# Patient Record
Sex: Male | Born: 1945 | Race: White | Hispanic: No | Marital: Married | Smoking: Former smoker
Health system: Southern US, Community
[De-identification: ages and names within clinical notes are randomized; demographics above are authoritative.]

## PROBLEM LIST (undated history)

## (undated) DIAGNOSIS — F32A Depression, unspecified: Secondary | ICD-10-CM

## (undated) DIAGNOSIS — Z808 Family history of malignant neoplasm of other organs or systems: Secondary | ICD-10-CM

## (undated) DIAGNOSIS — E119 Type 2 diabetes mellitus without complications: Secondary | ICD-10-CM

## (undated) DIAGNOSIS — M179 Osteoarthritis of knee, unspecified: Secondary | ICD-10-CM

## (undated) DIAGNOSIS — Z800 Family history of malignant neoplasm of digestive organs: Secondary | ICD-10-CM

## (undated) DIAGNOSIS — C61 Malignant neoplasm of prostate: Secondary | ICD-10-CM

## (undated) DIAGNOSIS — I10 Essential (primary) hypertension: Secondary | ICD-10-CM

## (undated) DIAGNOSIS — M1710 Unilateral primary osteoarthritis, unspecified knee: Secondary | ICD-10-CM

## (undated) DIAGNOSIS — E785 Hyperlipidemia, unspecified: Secondary | ICD-10-CM

## (undated) HISTORY — DX: Hyperlipidemia, unspecified: E78.5

## (undated) HISTORY — DX: Type 2 diabetes mellitus without complications: E11.9

## (undated) HISTORY — PX: PROSTATE BIOPSY: SHX241

## (undated) HISTORY — DX: Unilateral primary osteoarthritis, unspecified knee: M17.10

## (undated) HISTORY — DX: Family history of malignant neoplasm of other organs or systems: Z80.8

## (undated) HISTORY — DX: Family history of malignant neoplasm of digestive organs: Z80.0

## (undated) HISTORY — DX: Osteoarthritis of knee, unspecified: M17.9

## (undated) HISTORY — PX: INGUINAL HERNIA REPAIR: SUR1180

## (undated) HISTORY — DX: Essential (primary) hypertension: I10

---

## 2013-08-30 ENCOUNTER — Ambulatory Visit (HOSPITAL_COMMUNITY)
Admission: RE | Admit: 2013-08-30 | Discharge: 2013-08-30 | Disposition: A | Discharge status: Home / Self Care | Payer: Medicare Other | Source: Ambulatory Visit | Attending: Sports Medicine | Admitting: Sports Medicine

## 2013-08-30 ENCOUNTER — Other Ambulatory Visit (HOSPITAL_COMMUNITY): Payer: Self-pay | Admitting: Sports Medicine

## 2013-08-30 DIAGNOSIS — Z1389 Encounter for screening for other disorder: Secondary | ICD-10-CM

## 2013-08-30 IMAGING — CR DG ORBITS FOR FOREIGN BODY
2 series · 2 of 2 positions shown · non-contrast
Comparison: None.

CLINICAL DATA: History metal work.  Pre MRI screening.

EXAM:
ORBITS FOR FOREIGN BODY - 2 VIEW

[w waters * (1 of 2)]
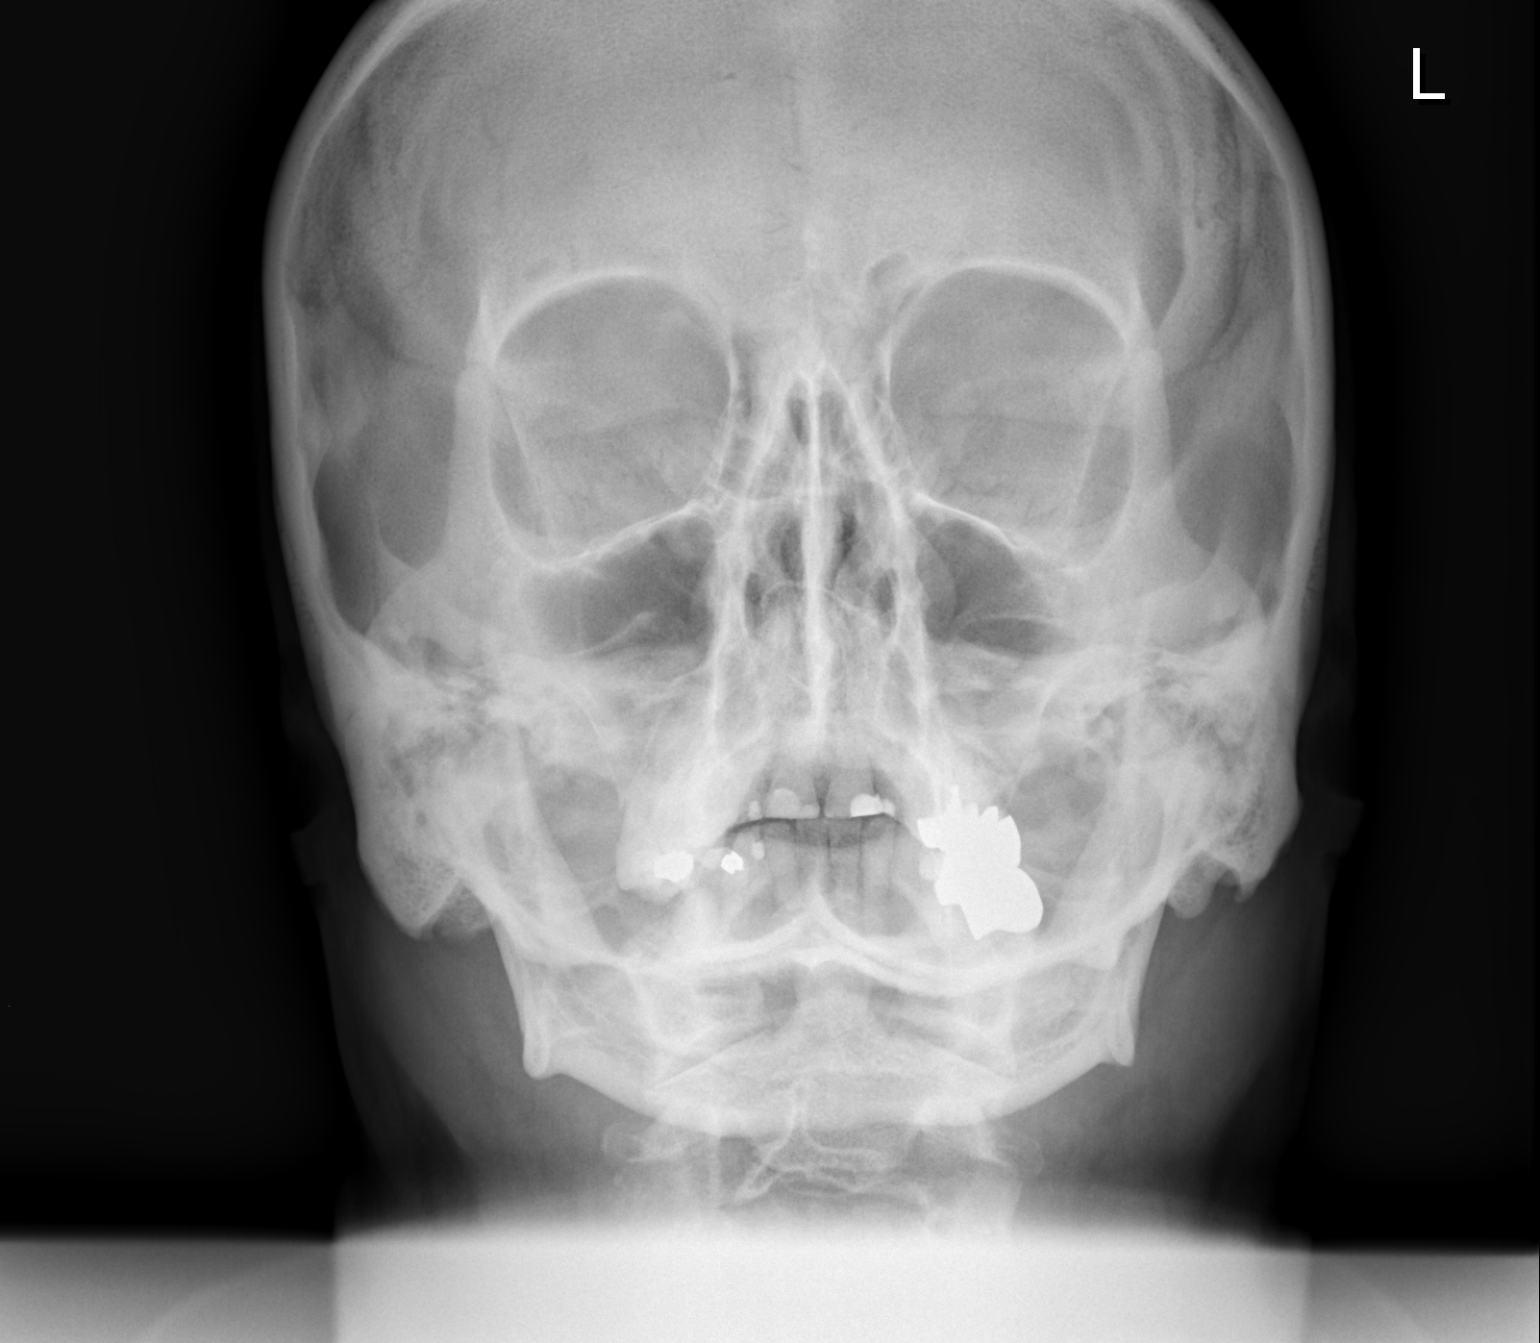

[w waters * (2 of 2)]
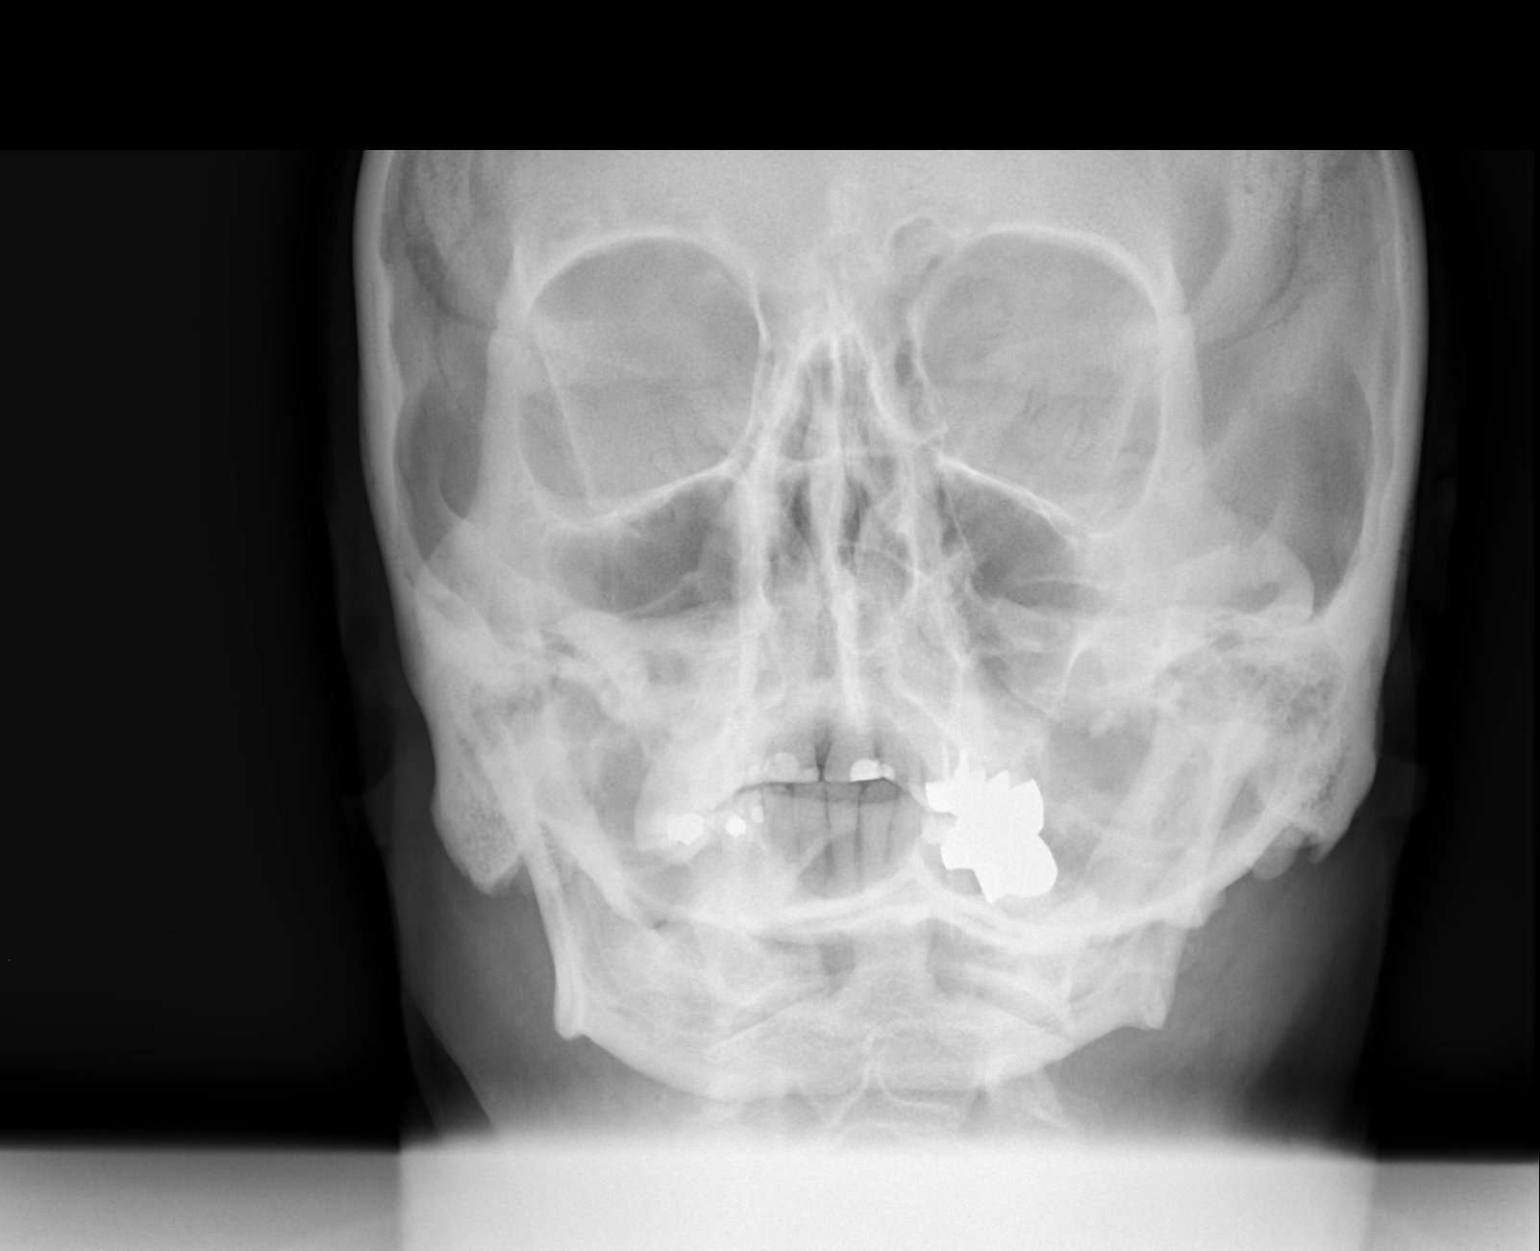

[2 of 2 positions shown; findings below may reference images not displayed]

FINDINGS: There is no evidence of metallic foreign body within the orbits. No
significant bone abnormality identified.
IMPRESSION: No evidence of metallic foreign body within the orbits.

## 2016-11-21 DIAGNOSIS — F419 Anxiety disorder, unspecified: Secondary | ICD-10-CM | POA: Diagnosis not present

## 2016-11-21 DIAGNOSIS — Z0000 Encounter for general adult medical examination without abnormal findings: Secondary | ICD-10-CM | POA: Diagnosis not present

## 2016-11-21 DIAGNOSIS — N529 Male erectile dysfunction, unspecified: Secondary | ICD-10-CM | POA: Diagnosis not present

## 2016-11-21 DIAGNOSIS — M1990 Unspecified osteoarthritis, unspecified site: Secondary | ICD-10-CM | POA: Diagnosis not present

## 2016-11-21 DIAGNOSIS — E785 Hyperlipidemia, unspecified: Secondary | ICD-10-CM | POA: Diagnosis not present

## 2016-11-21 DIAGNOSIS — Z23 Encounter for immunization: Secondary | ICD-10-CM | POA: Diagnosis not present

## 2016-11-21 DIAGNOSIS — E1140 Type 2 diabetes mellitus with diabetic neuropathy, unspecified: Secondary | ICD-10-CM | POA: Diagnosis not present

## 2016-11-21 DIAGNOSIS — Z1159 Encounter for screening for other viral diseases: Secondary | ICD-10-CM | POA: Diagnosis not present

## 2017-02-19 DIAGNOSIS — E785 Hyperlipidemia, unspecified: Secondary | ICD-10-CM | POA: Diagnosis not present

## 2017-02-19 DIAGNOSIS — I10 Essential (primary) hypertension: Secondary | ICD-10-CM | POA: Diagnosis not present

## 2017-02-19 DIAGNOSIS — Z7984 Long term (current) use of oral hypoglycemic drugs: Secondary | ICD-10-CM | POA: Diagnosis not present

## 2017-02-19 DIAGNOSIS — E1140 Type 2 diabetes mellitus with diabetic neuropathy, unspecified: Secondary | ICD-10-CM | POA: Diagnosis not present

## 2017-08-21 DIAGNOSIS — E1140 Type 2 diabetes mellitus with diabetic neuropathy, unspecified: Secondary | ICD-10-CM | POA: Diagnosis not present

## 2017-08-21 DIAGNOSIS — E1142 Type 2 diabetes mellitus with diabetic polyneuropathy: Secondary | ICD-10-CM | POA: Diagnosis not present

## 2017-08-21 DIAGNOSIS — F419 Anxiety disorder, unspecified: Secondary | ICD-10-CM | POA: Diagnosis not present

## 2017-08-21 DIAGNOSIS — E785 Hyperlipidemia, unspecified: Secondary | ICD-10-CM | POA: Diagnosis not present

## 2017-08-21 DIAGNOSIS — E782 Mixed hyperlipidemia: Secondary | ICD-10-CM | POA: Diagnosis not present

## 2017-08-21 DIAGNOSIS — I10 Essential (primary) hypertension: Secondary | ICD-10-CM | POA: Diagnosis not present

## 2017-08-21 DIAGNOSIS — Z23 Encounter for immunization: Secondary | ICD-10-CM | POA: Diagnosis not present

## 2017-08-21 DIAGNOSIS — M545 Low back pain: Secondary | ICD-10-CM | POA: Diagnosis not present

## 2017-08-21 DIAGNOSIS — M1990 Unspecified osteoarthritis, unspecified site: Secondary | ICD-10-CM | POA: Diagnosis not present

## 2017-09-07 DIAGNOSIS — E119 Type 2 diabetes mellitus without complications: Secondary | ICD-10-CM | POA: Diagnosis not present

## 2017-09-07 DIAGNOSIS — Z7984 Long term (current) use of oral hypoglycemic drugs: Secondary | ICD-10-CM | POA: Diagnosis not present

## 2017-09-07 DIAGNOSIS — H2513 Age-related nuclear cataract, bilateral: Secondary | ICD-10-CM | POA: Diagnosis not present

## 2017-09-07 DIAGNOSIS — H524 Presbyopia: Secondary | ICD-10-CM | POA: Diagnosis not present

## 2017-12-17 DIAGNOSIS — M545 Low back pain, unspecified: Secondary | ICD-10-CM | POA: Insufficient documentation

## 2017-12-17 DIAGNOSIS — M48061 Spinal stenosis, lumbar region without neurogenic claudication: Secondary | ICD-10-CM | POA: Insufficient documentation

## 2017-12-22 DIAGNOSIS — M48061 Spinal stenosis, lumbar region without neurogenic claudication: Secondary | ICD-10-CM | POA: Diagnosis not present

## 2018-01-17 DIAGNOSIS — M48061 Spinal stenosis, lumbar region without neurogenic claudication: Secondary | ICD-10-CM | POA: Diagnosis not present

## 2018-02-01 DIAGNOSIS — K635 Polyp of colon: Secondary | ICD-10-CM | POA: Diagnosis not present

## 2018-02-01 DIAGNOSIS — Z1211 Encounter for screening for malignant neoplasm of colon: Secondary | ICD-10-CM | POA: Diagnosis not present

## 2018-02-01 DIAGNOSIS — K5730 Diverticulosis of large intestine without perforation or abscess without bleeding: Secondary | ICD-10-CM | POA: Diagnosis not present

## 2018-02-01 DIAGNOSIS — K640 First degree hemorrhoids: Secondary | ICD-10-CM | POA: Diagnosis not present

## 2018-02-01 DIAGNOSIS — D126 Benign neoplasm of colon, unspecified: Secondary | ICD-10-CM | POA: Diagnosis not present

## 2018-02-05 DIAGNOSIS — Z1211 Encounter for screening for malignant neoplasm of colon: Secondary | ICD-10-CM | POA: Diagnosis not present

## 2018-02-05 DIAGNOSIS — D126 Benign neoplasm of colon, unspecified: Secondary | ICD-10-CM | POA: Diagnosis not present

## 2018-02-05 DIAGNOSIS — K635 Polyp of colon: Secondary | ICD-10-CM | POA: Diagnosis not present

## 2018-02-07 DIAGNOSIS — M545 Low back pain: Secondary | ICD-10-CM | POA: Diagnosis not present

## 2018-02-07 DIAGNOSIS — M48061 Spinal stenosis, lumbar region without neurogenic claudication: Secondary | ICD-10-CM | POA: Diagnosis not present

## 2018-02-07 DIAGNOSIS — M25511 Pain in right shoulder: Secondary | ICD-10-CM | POA: Diagnosis not present

## 2018-02-19 DIAGNOSIS — E1165 Type 2 diabetes mellitus with hyperglycemia: Secondary | ICD-10-CM | POA: Diagnosis not present

## 2018-02-19 DIAGNOSIS — E1140 Type 2 diabetes mellitus with diabetic neuropathy, unspecified: Secondary | ICD-10-CM | POA: Diagnosis not present

## 2018-02-19 DIAGNOSIS — I10 Essential (primary) hypertension: Secondary | ICD-10-CM | POA: Diagnosis not present

## 2018-02-19 DIAGNOSIS — Z7984 Long term (current) use of oral hypoglycemic drugs: Secondary | ICD-10-CM | POA: Diagnosis not present

## 2018-02-19 DIAGNOSIS — E782 Mixed hyperlipidemia: Secondary | ICD-10-CM | POA: Diagnosis not present

## 2018-02-19 DIAGNOSIS — Z0000 Encounter for general adult medical examination without abnormal findings: Secondary | ICD-10-CM | POA: Diagnosis not present

## 2018-02-19 DIAGNOSIS — E039 Hypothyroidism, unspecified: Secondary | ICD-10-CM | POA: Diagnosis not present

## 2018-02-19 DIAGNOSIS — M1990 Unspecified osteoarthritis, unspecified site: Secondary | ICD-10-CM | POA: Diagnosis not present

## 2018-05-29 DIAGNOSIS — E1140 Type 2 diabetes mellitus with diabetic neuropathy, unspecified: Secondary | ICD-10-CM | POA: Diagnosis not present

## 2018-05-29 DIAGNOSIS — E039 Hypothyroidism, unspecified: Secondary | ICD-10-CM | POA: Diagnosis not present

## 2018-06-29 DIAGNOSIS — L03115 Cellulitis of right lower limb: Secondary | ICD-10-CM | POA: Diagnosis not present

## 2018-07-01 DIAGNOSIS — L03115 Cellulitis of right lower limb: Secondary | ICD-10-CM | POA: Diagnosis not present

## 2018-07-01 DIAGNOSIS — G6289 Other specified polyneuropathies: Secondary | ICD-10-CM | POA: Diagnosis not present

## 2018-07-05 DIAGNOSIS — Z23 Encounter for immunization: Secondary | ICD-10-CM | POA: Diagnosis not present

## 2018-09-03 DIAGNOSIS — M25561 Pain in right knee: Secondary | ICD-10-CM | POA: Diagnosis not present

## 2018-09-03 DIAGNOSIS — M1712 Unilateral primary osteoarthritis, left knee: Secondary | ICD-10-CM | POA: Diagnosis not present

## 2018-09-03 DIAGNOSIS — M25562 Pain in left knee: Secondary | ICD-10-CM | POA: Insufficient documentation

## 2018-09-09 DIAGNOSIS — H25013 Cortical age-related cataract, bilateral: Secondary | ICD-10-CM | POA: Diagnosis not present

## 2018-09-09 DIAGNOSIS — H524 Presbyopia: Secondary | ICD-10-CM | POA: Diagnosis not present

## 2018-09-09 DIAGNOSIS — Z7984 Long term (current) use of oral hypoglycemic drugs: Secondary | ICD-10-CM | POA: Diagnosis not present

## 2018-09-09 DIAGNOSIS — E119 Type 2 diabetes mellitus without complications: Secondary | ICD-10-CM | POA: Diagnosis not present

## 2018-09-09 DIAGNOSIS — H2513 Age-related nuclear cataract, bilateral: Secondary | ICD-10-CM | POA: Diagnosis not present

## 2018-10-02 DIAGNOSIS — M1711 Unilateral primary osteoarthritis, right knee: Secondary | ICD-10-CM | POA: Insufficient documentation

## 2018-10-02 DIAGNOSIS — M1712 Unilateral primary osteoarthritis, left knee: Secondary | ICD-10-CM | POA: Diagnosis not present

## 2018-10-09 DIAGNOSIS — M170 Bilateral primary osteoarthritis of knee: Secondary | ICD-10-CM | POA: Diagnosis not present

## 2018-10-16 DIAGNOSIS — M170 Bilateral primary osteoarthritis of knee: Secondary | ICD-10-CM | POA: Diagnosis not present

## 2018-11-27 DIAGNOSIS — M1712 Unilateral primary osteoarthritis, left knee: Secondary | ICD-10-CM | POA: Diagnosis not present

## 2018-11-27 DIAGNOSIS — M1711 Unilateral primary osteoarthritis, right knee: Secondary | ICD-10-CM | POA: Diagnosis not present

## 2019-02-24 DIAGNOSIS — Z0000 Encounter for general adult medical examination without abnormal findings: Secondary | ICD-10-CM | POA: Diagnosis not present

## 2019-02-24 DIAGNOSIS — E1142 Type 2 diabetes mellitus with diabetic polyneuropathy: Secondary | ICD-10-CM | POA: Diagnosis not present

## 2019-02-24 DIAGNOSIS — E1140 Type 2 diabetes mellitus with diabetic neuropathy, unspecified: Secondary | ICD-10-CM | POA: Diagnosis not present

## 2019-02-24 DIAGNOSIS — M545 Low back pain: Secondary | ICD-10-CM | POA: Diagnosis not present

## 2019-02-24 DIAGNOSIS — E782 Mixed hyperlipidemia: Secondary | ICD-10-CM | POA: Diagnosis not present

## 2019-02-24 DIAGNOSIS — I10 Essential (primary) hypertension: Secondary | ICD-10-CM | POA: Diagnosis not present

## 2019-02-24 DIAGNOSIS — M1990 Unspecified osteoarthritis, unspecified site: Secondary | ICD-10-CM | POA: Diagnosis not present

## 2019-02-24 DIAGNOSIS — E039 Hypothyroidism, unspecified: Secondary | ICD-10-CM | POA: Diagnosis not present

## 2019-02-24 DIAGNOSIS — F419 Anxiety disorder, unspecified: Secondary | ICD-10-CM | POA: Diagnosis not present

## 2019-03-04 DIAGNOSIS — R3912 Poor urinary stream: Secondary | ICD-10-CM | POA: Diagnosis not present

## 2019-03-04 DIAGNOSIS — I10 Essential (primary) hypertension: Secondary | ICD-10-CM | POA: Diagnosis not present

## 2019-03-04 DIAGNOSIS — E782 Mixed hyperlipidemia: Secondary | ICD-10-CM | POA: Diagnosis not present

## 2019-03-04 DIAGNOSIS — E1140 Type 2 diabetes mellitus with diabetic neuropathy, unspecified: Secondary | ICD-10-CM | POA: Diagnosis not present

## 2019-03-04 DIAGNOSIS — Z7984 Long term (current) use of oral hypoglycemic drugs: Secondary | ICD-10-CM | POA: Diagnosis not present

## 2019-03-04 DIAGNOSIS — E039 Hypothyroidism, unspecified: Secondary | ICD-10-CM | POA: Diagnosis not present

## 2019-05-05 DIAGNOSIS — R3912 Poor urinary stream: Secondary | ICD-10-CM | POA: Diagnosis not present

## 2019-05-05 DIAGNOSIS — N5201 Erectile dysfunction due to arterial insufficiency: Secondary | ICD-10-CM | POA: Diagnosis not present

## 2019-05-05 DIAGNOSIS — R972 Elevated prostate specific antigen [PSA]: Secondary | ICD-10-CM | POA: Diagnosis not present

## 2019-05-05 DIAGNOSIS — N401 Enlarged prostate with lower urinary tract symptoms: Secondary | ICD-10-CM | POA: Diagnosis not present

## 2019-05-20 DIAGNOSIS — E039 Hypothyroidism, unspecified: Secondary | ICD-10-CM | POA: Diagnosis not present

## 2019-05-20 DIAGNOSIS — E782 Mixed hyperlipidemia: Secondary | ICD-10-CM | POA: Diagnosis not present

## 2019-05-20 DIAGNOSIS — E1140 Type 2 diabetes mellitus with diabetic neuropathy, unspecified: Secondary | ICD-10-CM | POA: Diagnosis not present

## 2019-05-29 DIAGNOSIS — Z23 Encounter for immunization: Secondary | ICD-10-CM | POA: Diagnosis not present

## 2019-06-18 DIAGNOSIS — R972 Elevated prostate specific antigen [PSA]: Secondary | ICD-10-CM | POA: Diagnosis not present

## 2019-06-23 ENCOUNTER — Other Ambulatory Visit: Payer: Self-pay | Admitting: Urology

## 2019-06-23 ENCOUNTER — Other Ambulatory Visit (HOSPITAL_COMMUNITY): Payer: Self-pay | Admitting: Urology

## 2019-06-23 DIAGNOSIS — C61 Malignant neoplasm of prostate: Secondary | ICD-10-CM

## 2019-07-03 ENCOUNTER — Ambulatory Visit (HOSPITAL_COMMUNITY)
Admission: RE | Admit: 2019-07-03 | Discharge: 2019-07-03 | Disposition: A | Discharge status: Home / Self Care | Payer: Medicare HMO | Source: Ambulatory Visit | Attending: Urology | Admitting: Urology

## 2019-07-03 ENCOUNTER — Other Ambulatory Visit: Payer: Self-pay

## 2019-07-03 DIAGNOSIS — C61 Malignant neoplasm of prostate: Secondary | ICD-10-CM | POA: Insufficient documentation

## 2019-07-03 IMAGING — NM NM BONE WHOLE BODY
2 series · 2 of 2 positions shown · non-contrast
Comparison: None.

CLINICAL DATA: 73-year-old male with prostate cancer. Current pain
in the right shoulder and both knees.

EXAM:
NUCLEAR MEDICINE WHOLE BODY BONE SCAN
TECHNIQUE: Whole body anterior and posterior images were obtained approximately
3 hours after intravenous injection of radiopharmaceutical.
RADIOPHARMACEUTICALS:  22.0 mCi 7echnetium-JJm MDP IV

[Series 1: wbr_bone_40 whole body · 2.66mm/px · 1 of 1 slices shown (1 of 2)]
[im 1/1]
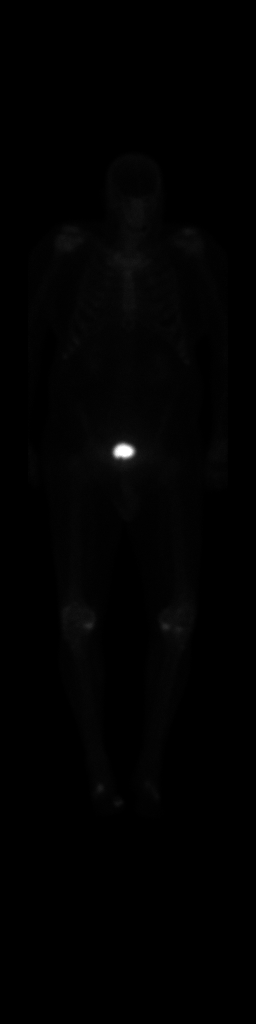

[Series 1: wbr_bone_40 whole body · 2.66mm/px · 1 of 1 slices shown (2 of 2)]
[im 1/1]
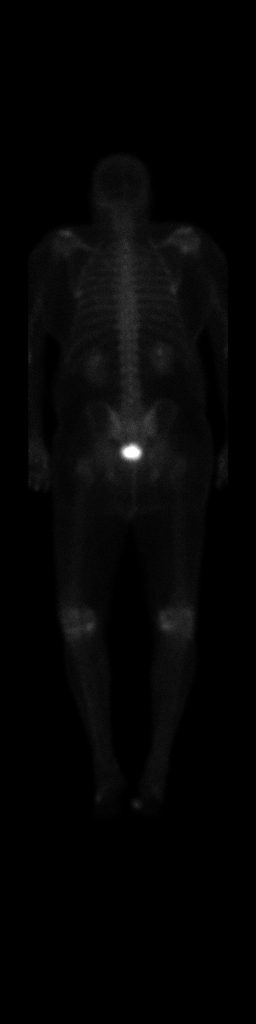

[2 of 2 positions shown; findings below may reference images not displayed]

FINDINGS: Expected radiotracer activity in both kidneys and the urinary
bladder.

Homogeneous radiotracer activity throughout the axial skeleton
including the skull and the pelvis.

Radiotracer activity in the visible upper extremities is normal.

There is moderate degenerative appearing radiotracer activity at
both knees (medial tibial plateaus), and both feet (tarsal and MTP
region).

No suspicious radiotracer activity identified.
IMPRESSION: 1.  No findings specific for metastatic disease to bone.
2. Degenerative appearing activity in both lower extremities.

## 2019-07-03 MED ORDER — TECHNETIUM TC 99M MEDRONATE IV KIT
22.0000 | PACK | Freq: Once | INTRAVENOUS | Status: AC
  Administered 2019-07-03: 10:00:00 22 via INTRAVENOUS

## 2019-07-04 ENCOUNTER — Ambulatory Visit (HOSPITAL_COMMUNITY): Payer: Medicare Other

## 2019-07-04 ENCOUNTER — Ambulatory Visit (HOSPITAL_COMMUNITY): Admission: RE | Admit: 2019-07-04 | Payer: Medicare Other | Source: Ambulatory Visit

## 2019-07-04 DIAGNOSIS — C61 Malignant neoplasm of prostate: Secondary | ICD-10-CM | POA: Diagnosis not present

## 2019-07-16 DIAGNOSIS — C61 Malignant neoplasm of prostate: Secondary | ICD-10-CM | POA: Diagnosis not present

## 2019-07-18 ENCOUNTER — Encounter: Payer: Self-pay | Admitting: *Deleted

## 2019-07-21 ENCOUNTER — Encounter: Payer: Self-pay | Admitting: Radiation Oncology

## 2019-07-21 NOTE — Progress Notes (Signed)
GU Location of Tumor / Histology: prostatic adenocarcinoma  If Prostate Cancer, Gleason Score is (4 + 3) and PSA is (20.85). Prostate volume: 52 grams  Kevin Olson has a history of BPH. Reports blood and urine sample were collected in August at [facility] Elevated PSA was noted and patient was referred to [facility] for further evaluation.   02/02/2010 PSA  1.41 05/2011  PSA  2.21 05/05/2019 PSA  17.1  Biopsies of prostate (if applicable) revealed:    Past/Anticipated interventions by urology, if any: prescribed tamsulosin, prostate biopsy, CT scan (negative), bone scan (negative), referral to Dr. Tammi Klippel for consideration of radiotherapy  Past/Anticipated interventions by medical oncology, if any: no  Weight changes, if any: no  Bowel/Bladder complaints, if any: IPSS 18. SHIM 9. Denies dysuria or hematuria. Denies urinary leakage or incontinence. Endorses taking tamsulosin. Reports weak urine stream. Reports urinary frequency and urgency.   Nausea/Vomiting, if any: no  Pain issues, if any:  denies  SAFETY ISSUES:  Prior radiation? Denies  Pacemaker/ICD? Denies   Possible current pregnancy? no, male patient  Is the patient on methotrexate? Denies  Current Complaints / other details:  73 year old male. Married. Retired from [employer]. Sister with history of pancreatic ca. Patient with hx of inguinal hernia repair.

## 2019-07-22 ENCOUNTER — Ambulatory Visit
Admission: RE | Admit: 2019-07-22 | Discharge: 2019-07-22 | Disposition: A | Discharge status: Home / Self Care | Payer: Medicare Other | Source: Ambulatory Visit | Attending: Radiation Oncology | Admitting: Radiation Oncology

## 2019-07-22 ENCOUNTER — Other Ambulatory Visit: Payer: Self-pay

## 2019-07-22 ENCOUNTER — Encounter: Payer: Self-pay | Admitting: Radiation Oncology

## 2019-07-22 VITALS — Ht 69.0 in | Wt 230.0 lb

## 2019-07-22 DIAGNOSIS — R972 Elevated prostate specific antigen [PSA]: Secondary | ICD-10-CM | POA: Diagnosis not present

## 2019-07-22 DIAGNOSIS — C61 Malignant neoplasm of prostate: Secondary | ICD-10-CM

## 2019-07-22 DIAGNOSIS — Z808 Family history of malignant neoplasm of other organs or systems: Secondary | ICD-10-CM | POA: Diagnosis not present

## 2019-07-22 HISTORY — DX: Malignant neoplasm of prostate: C61

## 2019-07-22 NOTE — Progress Notes (Signed)
Radiation Oncology         (336) [phone] ________________________________  Initial outpatient Consultation - Conducted via MyChart due to current COVID-19 concerns for limiting patient exposure  Name: Sadao Weyer MRN: [id number]  Date: 07/22/2019  DOB: 12/23/1945  CC:[facility], [facility]  [facility], [facility]   REFERRING PHYSICIAN: [facility]  DIAGNOSIS: 73 y.o. gentleman with Stage T2a adenocarcinoma of the prostate with Gleason score of 4+3, and PSA of 17.1.    ICD-10-CM   1. Malignant neoplasm of prostate ([place])  C61     HISTORY OF PRESENT ILLNESS: Donnie Panik is a 73 y.o. male with a diagnosis of prostate cancer. He was noted to have an elevated PSA of 20.85 by his primary care provider, Mat Carne, PA-C.  Prior PSA in 05/2011 was 2.21.  Accordingly, he was referred for evaluation in urology by Dr. Junious Silk on 05/05/2019,  digital rectal examination was performed at that time revealing concerning right lobe firmness.  A repeat PSA performed that day remained elevated at 17.1.  The patient proceeded to transrectal ultrasound with 12 biopsies of the prostate on 06/18/2019.  The prostate volume measured 52 cc.  Out of 12 core biopsies, 11 were positive.  The maximum Gleason score was 4+3, and this was seen in left mid lateral, left base, left mid (with perineural invasion), right base, and right base lateral. Gleason 3+4 was seen in right mid lateral, right apex (with PNI), left apex (with PNI), left apex lateral (with PNI), and left base lateral (with PNI).  He had a bone scan and CT A/P for disease staging.  The bone scan on 07/03/2019 showed no findings specific for metastatic disease to bone and the CT abdomen/pelvis on 07/04/2019 showed no pathologic adenopathy or findings of osseous metastatic disease.  The patient reviewed the biopsy results with his urologist and he has kindly been referred today for discussion of potential radiation treatment  options.   PREVIOUS RADIATION THERAPY: No  PAST MEDICAL HISTORY:  Past Medical History:  Diagnosis Date  . Prostate cancer ([place])       PAST SURGICAL HISTORY: Past Surgical History:  Procedure Laterality Date  . INGUINAL HERNIA REPAIR    . PROSTATE BIOPSY      FAMILY HISTORY:  Family History  Problem Relation Age of Onset  . Pancreatic cancer Sister   . Other Sister        brain tumor  . Colon cancer Neg Hx   . Breast cancer Neg Hx     SOCIAL HISTORY:  Social History   Socioeconomic History  . Marital status: Married    Spouse name: Not on file  . Number of children: Not on file  . Years of education: Not on file  . Highest education level: Not on file  Occupational History  . Occupation: retired    Comment: Barista  . Financial resource strain: Not on file  . Food insecurity    Worry: Not on file    Inability: Not on file  . Transportation needs    Medical: Not on file    Non-medical: Not on file  Tobacco Use  . Smoking status: Former Smoker    Packs/day: 2.00    Years: 12.00    Pack years: 24.00    Types: Cigarettes    Quit date: 04/19/1979    Years since quitting: 40.2  . Smokeless tobacco: Never Used  Substance and Sexual Activity  . Alcohol use: Not  Currently  . Drug use: Never  . 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 Physical activity    Days per week: Not on file    Minutes per session: Not on file  . Stress: Not on file  Relationships  . Social Herbalist on phone: Not on file    Gets together: Not on file    Attends religious service: Not on file    Active member of club or organization: Not on file    Attends meetings of clubs or organizations: Not on file    Relationship status: Not on file  . Intimate partner violence    Fear of current or ex partner: Not on file    Emotionally abused: Not on file    Physically abused: Not on file    Forced  sexual activity: Not on file  Other Topics Concern  . Not on file  Social History Narrative  . Not on file    ALLERGIES: Patient has no known allergies.  MEDICATIONS:  Current Outpatient Medications  Medication Sig Dispense Refill  . atorvastatin (LIPITOR) 80 MG tablet     . baclofen (LIORESAL) 10 MG tablet baclofen 10 mg tablet    . Blood Glucose Monitoring Suppl (TRUE METRIX AIR GLUCOSE METER) DEVI True Metrix Air Glucose Meter kit    . calcium carbonate (OS-CAL) 1250 (500 Ca) MG chewable tablet Chew 1 tablet by mouth daily.    . celecoxib (CELEBREX) 200 MG capsule celecoxib 200 mg capsule    . DULoxetine (CYMBALTA) 60 MG capsule duloxetine 60 mg capsule,delayed release    . glimepiride (AMARYL) 4 MG tablet glimepiride 4 mg tablet    . glucose blood (TRUE METRIX BLOOD GLUCOSE TEST) test strip True Metrix Glucose Test Strip    . levothyroxine (SYNTHROID) 100 MCG tablet     . losartan (COZAAR) 25 MG tablet losartan 25 mg tablet    . metFORMIN (GLUCOPHAGE) 1000 MG tablet metformin 1,000 mg tablet    . Multiple Vitamins-Minerals (CENTRUM MEN PO) Take by mouth.    . pioglitazone (ACTOS) 15 MG tablet pioglitazone 15 mg tablet    . tamsulosin (FLOMAX) 0.4 MG CAPS capsule tamsulosin 0.4 mg capsule    . traMADol (ULTRAM) 50 MG tablet tramadol 50 mg tablet    . TRUEplus Lancets 33G MISC TRUEplus Lancets 33 gauge    . ALPRAZolam (XANAX) 1 MG tablet alprazolam 1 mg tablet    . cyclobenzaprine (FLEXERIL) 5 MG tablet     . gabapentin (NEURONTIN) 300 MG capsule gabapentin 300 mg capsule    . naproxen (NAPROSYN) 500 MG tablet naproxen 500 mg tablet     No current facility-administered medications for this encounter.     REVIEW OF SYSTEMS:  On review of systems, the patient reports that he is doing well overall. He denies any chest pain, shortness of breath, cough, fevers, chills, night sweats, unintended weight changes. He denies any bowel disturbances, and denies abdominal pain, nausea or  vomiting. He denies any new musculoskeletal or joint aches or pains. His IPSS was 18, indicating moderate-severe urinary symptoms. He reports weak urine stream, urinary frequency, and urgency despite taking tamsulosin. His SHIM was 9, indicating he has moderate erectile dysfunction. A complete review of systems is obtained and is otherwise negative.    PHYSICAL EXAM:  Wt Readings from Last 3 Encounters:  07/22/19 230 lb (104.3 kg)  07/03/19 230 lb (104.3  kg)   Temp Readings from Last 3 Encounters:  No data found for Temp   BP Readings from Last 3 Encounters:  No data found for BP   Pulse Readings from Last 3 Encounters:  No data found for Pulse   Pain Assessment Pain Score: 0-No pain/10  In general this is a well appearing Caucasian gentleman in no acute distress. He's alert and oriented x4 and appropriate throughout the examination. Cardiopulmonary assessment is negative for acute distress and he exhibits normal effort.    KPS = 90  100 - Normal; no complaints; no evidence of disease. 90   - Able to carry on normal activity; minor signs or symptoms of disease. 80   - Normal activity with effort; some signs or symptoms of disease. 34   - Cares for self; unable to carry on normal activity or to do active work. 60   - Requires occasional assistance, but is able to care for most of his personal needs. 50   - Requires considerable assistance and frequent medical care. 50   - Disabled; requires special care and assistance. 75   - Severely disabled; hospital admission is indicated although death not imminent. 70   - Very sick; hospital admission necessary; active supportive treatment necessary. 10   - Moribund; fatal processes progressing rapidly. 0     - Dead  Karnofsky DA, Abelmann WH, Craver LS and Burchenal JH [phone]) The use of the nitrogen mustards in the palliative treatment of carcinoma: with particular reference to bronchogenic carcinoma Cancer 1 634-56  LABORATORY DATA:  No  results found for: WBC, HGB, HCT, MCV, PLT No results found for: NA, K, CL, CO2 No results found for: ALT, AST, GGT, ALKPHOS, BILITOT   RADIOGRAPHY: Nm Bone Scan Whole Body  Result Date: 07/04/2019 CLINICAL DATA:  73 year old male with prostate cancer. Current pain in the right shoulder and both knees. EXAM: NUCLEAR MEDICINE WHOLE BODY BONE SCAN TECHNIQUE: Whole body anterior and posterior images were obtained approximately 3 hours after intravenous injection of radiopharmaceutical. RADIOPHARMACEUTICALS:  22.0 mCi Technetium-60mMDP IV COMPARISON:  None. FINDINGS: Expected radiotracer activity in both kidneys and the urinary bladder. Homogeneous radiotracer activity throughout the axial skeleton including the skull and the pelvis. Radiotracer activity in the visible upper extremities is normal. There is moderate degenerative appearing radiotracer activity at both knees (medial tibial plateaus), and both feet (tarsal and MTP region). No suspicious radiotracer activity identified. IMPRESSION: 1.  No findings specific for metastatic disease to bone. 2. Degenerative appearing activity in both lower extremities. Electronically Signed   By: HGenevie AnnM.D.   On: 07/04/2019 03:34      IMPRESSION/PLAN: This visit was conducted via MyChart to spare the patient unnecessary potential exposure in the healthcare setting during the current COVID-19 pandemic. 1. 73y.o. gentleman with Stage T2a adenocarcinoma of the prostate with Gleason Score of 4+3, and PSA of 17.1. We discussed the patient's workup and outlined the nature of prostate cancer in this setting. The patient's T stage, Gleason's score, and PSA put him into the unfavorable intermediate risk group. Accordingly, he is eligible for a variety of potential treatment options including brachytherapy, 5.5 weeks of external radiation, or prostatectomy. We discussed the available radiation techniques, and focused on the details and logistics of delivery. The patient is  not an ideal candidate for brachytherapy with an IPSS score of 18 despite taking Flomax. We discussed and outlined the risks, benefits, short and long-term effects associated with radiotherapy and compared and  contrasted these with prostatectomy. We discussed the role of SpaceOAR in reducing the rectal toxicity associated with radiotherapy. We also detailed the role of ADT in the treatment of high volume, unfavorable intermediate risk prostate cancer and outlined the associated side effects that could be expected with this therapy.  He was encouraged to ask questions that were answered to his stated satisfaction.  He appears to have a good understanding of his disease and our treatment recommendations which are of curative intent.  At the end of the conversation the patient is interested in moving forward with 5.5 weeks of prostate IMRT in combination with short-term ADT. We will share our discussion with Dr. Junious Silk and make arrangements for start of ADT, first available, in anticipation of begining radiotherapy approximately 8 weeks thereafter.  In the interim, we will also coordinate for fiducial markers and SpaceOAR gel placement to reduce rectal toxicity from radiotherapy placement prior to CT simulation. We enjoyed meeting him and his wife today and look forward to continuing to participate in his care.  Given current concerns for patient exposure during the COVID-19 pandemic, this encounter was conducted via video-enabled MyChart visit.  The patient has given verbal consent for this type of encounter. The time spent during this encounter was 60 minutes. The attendants for this meeting include Tyler Pita MD, Ashlyn Bruning PA-C, [place], patient, Rena Sweeden and his wife. During the encounter, Tyler Pita MD, Ashlyn Bruning PA-C, and scribe, Wilburn Mylar were located at [place].  Patient, Akoni Parton and his wife  were located at home.    Nicholos Johns, PA-C    Tyler Pita, MD  [facility] Direct Dial: [phone]  Fax: [phone] [email or web address]  Skype  [email or web address]  This document serves as a record of services personally performed by Tyler Pita, MD and Freeman Caldron, PA-C. It was created on their behalf by Wilburn Mylar, a trained medical scribe. The creation of this record is based on the scribe's personal observations and the provider's statements to them. This document has been checked and approved by the attending provider.

## 2019-07-23 ENCOUNTER — Telehealth: Payer: Self-pay | Admitting: *Deleted

## 2019-07-23 NOTE — Telephone Encounter (Signed)
CALLED PATIENT TO INFORM OF HORMONE INJ. FOR 09/11/19 - ARRIVAL TIME- 9:45 AM @ DR. Lyndal Rainbow OFFICE, SPOKE WITH PATIENT AND HE IS AWARE OF THIS APPT.

## 2019-07-28 DIAGNOSIS — C61 Malignant neoplasm of prostate: Secondary | ICD-10-CM | POA: Diagnosis not present

## 2019-07-28 DIAGNOSIS — R3912 Poor urinary stream: Secondary | ICD-10-CM | POA: Diagnosis not present

## 2019-07-28 DIAGNOSIS — N401 Enlarged prostate with lower urinary tract symptoms: Secondary | ICD-10-CM | POA: Diagnosis not present

## 2019-07-29 ENCOUNTER — Encounter: Payer: Self-pay | Admitting: Medical Oncology

## 2019-08-04 ENCOUNTER — Encounter: Payer: Self-pay | Admitting: Urology

## 2019-08-04 NOTE — Progress Notes (Signed)
Patient started ADT 07/28/19 so he will need to have fiducials/SpaceOAR with Dr. Junious Silk, week of 09/15/19 in anticipation of beginning his treatments the week of 09/22/19. Request sent to [facility] 08/04/19 to begin coordinating.  Nicholos Johns, MMS, PA-C [facility] [place]: [phone]  Fax: [phone]

## 2019-08-07 ENCOUNTER — Telehealth: Payer: Self-pay | Admitting: Radiation Oncology

## 2019-08-07 NOTE — Telephone Encounter (Addendum)
Received voicemail message from patient and his wife ready to schedule fuducial marker and spaceoar placement. Patient confirms he has received his Lupron. Expressed appreciation for their call. Explained that the scheduler, Enid Derry, will be back in the office tomorrow and will contact them about these arrangements. Patient and wife verbalized understanding and expressed appreciation for the call. Explained if contact hasn't been made by Monday they are free to phone me back.   Patient started ADT 07/28/19 so he will need to have fiducials/SpaceOAR with Dr. Junious Silk, week of 09/15/19 in anticipation of beginning his treatments the week of 09/22/19. Request sent to [facility] 08/04/19 to begin coordinating.

## 2019-08-08 ENCOUNTER — Encounter: Payer: Self-pay | Admitting: Medical Oncology

## 2019-08-08 ENCOUNTER — Telehealth: Payer: Self-pay | Admitting: *Deleted

## 2019-08-08 NOTE — Progress Notes (Signed)
Pt received Eligard and will be contacted by Enid Derry to schedule fiducial markers and Space Oar.

## 2019-08-08 NOTE — Progress Notes (Signed)
Called patient as follow up and spoke with wife, Cherie. I introduced myself to her and my role. He is doing well post hormone injection but was just concerned he had not heard about up coming appointments. She states she called Sam yesterday and Enid Derry call her today. We discussed the injection being the start of treatment and  radiation starting  8 weeks after injection. I asked her to call me with questions or concerns and she voiced understanding.

## 2019-08-08 NOTE — Telephone Encounter (Signed)
CALLED PATIENT TO INFORM THAT I AM WAITING FOR [facility] TO CALL ME WITH THE DATE AND TIME FOR HIS FID. MARKER AND SPACE OAR PLACEMENT, SPOKE WITH PATIENT AND HE IS AWARE THAT I AM WAITING FOR HIS CALL, I WILL UPDATE THIS PATIENT ON Tuesday NOV. 24, PATIENT VERIFIED UNDERSTANDING THIS

## 2019-08-12 ENCOUNTER — Telehealth: Payer: Self-pay | Admitting: *Deleted

## 2019-08-12 NOTE — Telephone Encounter (Signed)
CALLED PATIENT TO GIVE UPDATE, LVM FOR A RETURN CALL

## 2019-08-26 ENCOUNTER — Other Ambulatory Visit: Payer: Self-pay | Admitting: Urology

## 2019-08-26 DIAGNOSIS — C61 Malignant neoplasm of prostate: Secondary | ICD-10-CM | POA: Diagnosis not present

## 2019-08-28 ENCOUNTER — Encounter: Payer: Self-pay | Admitting: *Deleted

## 2019-08-28 ENCOUNTER — Other Ambulatory Visit: Payer: Self-pay | Admitting: Urology

## 2019-08-28 ENCOUNTER — Telehealth: Payer: Self-pay | Admitting: *Deleted

## 2019-08-28 MED ORDER — LORAZEPAM 1 MG PO TABS: 1.0000 mg | ORAL_TABLET | ORAL | 0 refills | Status: DC | PRN

## 2019-08-28 NOTE — Telephone Encounter (Signed)
Called patient to inform that sim appt. has been altered to 09-04-19 - arrival time- 8:45 am @ [facility] and his MRI is scheduled for 09-04-19 - arrival time- 7:30 pm @ [facility], spoke with patient and he is aware of these appts.

## 2019-08-29 ENCOUNTER — Ambulatory Visit: Payer: Medicare HMO | Admitting: Radiation Oncology

## 2019-08-29 ENCOUNTER — Telehealth: Payer: Self-pay | Admitting: *Deleted

## 2019-08-29 NOTE — Telephone Encounter (Signed)
Called patient to inform that script is ready for pick-up and instructions will be on the bottle, lvm for a return call

## 2019-09-03 ENCOUNTER — Telehealth: Payer: Self-pay | Admitting: *Deleted

## 2019-09-03 NOTE — Telephone Encounter (Signed)
Called patient inform that sim appt. has been moved to 09-16-19 - arrival time- 7:45 am @ [facility] and his MRI - arrival time- 2:30 pm @ [facility], spoke with patient and he is aware of the appt. changes and is good with them.

## 2019-09-04 ENCOUNTER — Ambulatory Visit (HOSPITAL_COMMUNITY): Payer: Medicare HMO

## 2019-09-04 ENCOUNTER — Ambulatory Visit: Payer: Medicare HMO | Admitting: Radiation Oncology

## 2019-09-10 DIAGNOSIS — H524 Presbyopia: Secondary | ICD-10-CM | POA: Diagnosis not present

## 2019-09-10 DIAGNOSIS — E119 Type 2 diabetes mellitus without complications: Secondary | ICD-10-CM | POA: Diagnosis not present

## 2019-09-10 DIAGNOSIS — H25813 Combined forms of age-related cataract, bilateral: Secondary | ICD-10-CM | POA: Diagnosis not present

## 2019-09-15 ENCOUNTER — Telehealth: Payer: Self-pay | Admitting: *Deleted

## 2019-09-15 NOTE — Telephone Encounter (Signed)
CALLED PATIENT TO REMIND OF SIM APPT. AND MRI FOR 09-16-19, SPOKE WITH PATIENT'S WIFE - CHERIE AND SHE IS AWARE OF THESE APPTS.

## 2019-09-15 NOTE — Telephone Encounter (Signed)
Called patient's wife to inform that Dr. Tammi Klippel said that it would be o.k. if she comes with her husband tomorrow for his sim, patient's wife verified understanding this

## 2019-09-15 NOTE — Progress Notes (Signed)
  Radiation Oncology         (336) [phone] ________________________________  Name: Kevin Olson MRN: [id number]  Date: 09/16/2019  DOB: 05-27-1946  SIMULATION AND TREATMENT PLANNING NOTE    ICD-10-CM   1. Malignant neoplasm of prostate ([place])  C61     DIAGNOSIS:  73 y.o. gentleman with Stage T2a adenocarcinoma of the prostate with Gleason score of 4+3, and PSA of 17.1.  NARRATIVE:  The patient was brought to the [facility].  Identity was confirmed.  All relevant records and images related to the planned course of therapy were reviewed.  The patient freely provided informed written consent to proceed with treatment after reviewing the details related to the planned course of therapy. The consent form was witnessed and verified by the simulation staff.  Then, the patient was set-up in a stable reproducible supine position for radiation therapy.  A vacuum lock pillow device was custom fabricated to position his legs in a reproducible immobilized position.  Then, I performed a urethrogram under sterile conditions to identify the prostatic apex.  CT images were obtained.  Surface markings were placed.  The CT images were loaded into the planning software.  Then the prostate target and avoidance structures including the rectum, bladder, bowel and hips were contoured.  Treatment planning then occurred.  The radiation prescription was entered and confirmed.  A total of one complex treatment devices was fabricated. I have requested : Intensity Modulated Radiotherapy (IMRT) is medically necessary for this case for the following reason:  Rectal sparing.[name] Kitchen  PLAN:  The patient will receive 70 Gy in 28 fractions.  ________________________________  Sheral Apley Tammi Klippel, M.D.

## 2019-09-16 ENCOUNTER — Encounter: Payer: Self-pay | Admitting: Medical Oncology

## 2019-09-16 ENCOUNTER — Ambulatory Visit
Admission: RE | Admit: 2019-09-16 | Discharge: 2019-09-16 | Disposition: A | Discharge status: Home / Self Care | Payer: Medicare HMO | Source: Ambulatory Visit | Attending: Radiation Oncology | Admitting: Radiation Oncology

## 2019-09-16 ENCOUNTER — Other Ambulatory Visit: Payer: Self-pay

## 2019-09-16 ENCOUNTER — Ambulatory Visit (HOSPITAL_COMMUNITY)
Admission: RE | Admit: 2019-09-16 | Discharge: 2019-09-16 | Disposition: A | Discharge status: Home / Self Care | Payer: Medicare HMO | Source: Ambulatory Visit | Attending: Urology | Admitting: Urology

## 2019-09-16 DIAGNOSIS — C61 Malignant neoplasm of prostate: Secondary | ICD-10-CM

## 2019-09-22 DIAGNOSIS — C61 Malignant neoplasm of prostate: Secondary | ICD-10-CM | POA: Diagnosis not present

## 2019-09-29 ENCOUNTER — Other Ambulatory Visit: Payer: Self-pay

## 2019-09-29 ENCOUNTER — Ambulatory Visit
Admission: RE | Admit: 2019-09-29 | Discharge: 2019-09-29 | Disposition: A | Discharge status: Home / Self Care | Payer: Medicare HMO | Source: Ambulatory Visit | Attending: Radiation Oncology | Admitting: Radiation Oncology

## 2019-09-29 ENCOUNTER — Encounter: Payer: Self-pay | Admitting: Medical Oncology

## 2019-09-29 DIAGNOSIS — C61 Malignant neoplasm of prostate: Secondary | ICD-10-CM | POA: Diagnosis not present

## 2019-09-30 ENCOUNTER — Ambulatory Visit
Admission: RE | Admit: 2019-09-30 | Discharge: 2019-09-30 | Disposition: A | Discharge status: Home / Self Care | Payer: Medicare HMO | Source: Ambulatory Visit | Attending: Radiation Oncology | Admitting: Radiation Oncology

## 2019-09-30 ENCOUNTER — Other Ambulatory Visit: Payer: Self-pay

## 2019-09-30 DIAGNOSIS — C61 Malignant neoplasm of prostate: Secondary | ICD-10-CM | POA: Diagnosis not present

## 2019-10-01 ENCOUNTER — Ambulatory Visit
Admission: RE | Admit: 2019-10-01 | Discharge: 2019-10-01 | Disposition: A | Discharge status: Home / Self Care | Payer: Medicare HMO | Source: Ambulatory Visit | Attending: Radiation Oncology | Admitting: Radiation Oncology

## 2019-10-01 DIAGNOSIS — C61 Malignant neoplasm of prostate: Secondary | ICD-10-CM | POA: Diagnosis not present

## 2019-10-02 ENCOUNTER — Other Ambulatory Visit: Payer: Self-pay

## 2019-10-02 ENCOUNTER — Ambulatory Visit
Admission: RE | Admit: 2019-10-02 | Discharge: 2019-10-02 | Disposition: A | Discharge status: Home / Self Care | Payer: Medicare HMO | Source: Ambulatory Visit | Attending: Radiation Oncology | Admitting: Radiation Oncology

## 2019-10-02 DIAGNOSIS — C61 Malignant neoplasm of prostate: Secondary | ICD-10-CM | POA: Diagnosis not present

## 2019-10-03 ENCOUNTER — Other Ambulatory Visit: Payer: Self-pay

## 2019-10-03 ENCOUNTER — Ambulatory Visit
Admission: RE | Admit: 2019-10-03 | Discharge: 2019-10-03 | Disposition: A | Discharge status: Home / Self Care | Payer: Medicare HMO | Source: Ambulatory Visit | Attending: Radiation Oncology | Admitting: Radiation Oncology

## 2019-10-03 DIAGNOSIS — C61 Malignant neoplasm of prostate: Secondary | ICD-10-CM | POA: Diagnosis not present

## 2019-10-06 ENCOUNTER — Other Ambulatory Visit: Payer: Self-pay

## 2019-10-06 ENCOUNTER — Ambulatory Visit
Admission: RE | Admit: 2019-10-06 | Discharge: 2019-10-06 | Disposition: A | Discharge status: Home / Self Care | Payer: Medicare HMO | Source: Ambulatory Visit | Attending: Radiation Oncology | Admitting: Radiation Oncology

## 2019-10-06 DIAGNOSIS — C61 Malignant neoplasm of prostate: Secondary | ICD-10-CM | POA: Diagnosis not present

## 2019-10-07 ENCOUNTER — Ambulatory Visit
Admission: RE | Admit: 2019-10-07 | Discharge: 2019-10-07 | Disposition: A | Discharge status: Home / Self Care | Payer: Medicare HMO | Source: Ambulatory Visit | Attending: Radiation Oncology | Admitting: Radiation Oncology

## 2019-10-07 ENCOUNTER — Other Ambulatory Visit: Payer: Self-pay

## 2019-10-07 DIAGNOSIS — C61 Malignant neoplasm of prostate: Secondary | ICD-10-CM | POA: Diagnosis not present

## 2019-10-08 ENCOUNTER — Ambulatory Visit
Admission: RE | Admit: 2019-10-08 | Discharge: 2019-10-08 | Disposition: A | Discharge status: Home / Self Care | Payer: Medicare HMO | Source: Ambulatory Visit | Attending: Radiation Oncology | Admitting: Radiation Oncology

## 2019-10-08 ENCOUNTER — Other Ambulatory Visit: Payer: Self-pay

## 2019-10-08 DIAGNOSIS — C61 Malignant neoplasm of prostate: Secondary | ICD-10-CM | POA: Diagnosis not present

## 2019-10-09 ENCOUNTER — Ambulatory Visit
Admission: RE | Admit: 2019-10-09 | Discharge: 2019-10-09 | Disposition: A | Discharge status: Home / Self Care | Payer: Medicare HMO | Source: Ambulatory Visit | Attending: Radiation Oncology | Admitting: Radiation Oncology

## 2019-10-09 ENCOUNTER — Other Ambulatory Visit: Payer: Self-pay

## 2019-10-09 DIAGNOSIS — C61 Malignant neoplasm of prostate: Secondary | ICD-10-CM | POA: Diagnosis not present

## 2019-10-10 ENCOUNTER — Ambulatory Visit
Admission: RE | Admit: 2019-10-10 | Discharge: 2019-10-10 | Disposition: A | Discharge status: Home / Self Care | Payer: Medicare HMO | Source: Ambulatory Visit | Attending: Radiation Oncology | Admitting: Radiation Oncology

## 2019-10-10 ENCOUNTER — Other Ambulatory Visit: Payer: Self-pay

## 2019-10-10 DIAGNOSIS — C61 Malignant neoplasm of prostate: Secondary | ICD-10-CM | POA: Diagnosis not present

## 2019-10-13 ENCOUNTER — Ambulatory Visit
Admission: RE | Admit: 2019-10-13 | Discharge: 2019-10-13 | Disposition: A | Discharge status: Home / Self Care | Payer: Medicare HMO | Source: Ambulatory Visit | Attending: Radiation Oncology | Admitting: Radiation Oncology

## 2019-10-13 ENCOUNTER — Other Ambulatory Visit: Payer: Self-pay

## 2019-10-13 DIAGNOSIS — C61 Malignant neoplasm of prostate: Secondary | ICD-10-CM | POA: Diagnosis not present

## 2019-10-14 ENCOUNTER — Ambulatory Visit
Admission: RE | Admit: 2019-10-14 | Discharge: 2019-10-14 | Disposition: A | Discharge status: Home / Self Care | Payer: Medicare HMO | Source: Ambulatory Visit | Attending: Radiation Oncology | Admitting: Radiation Oncology

## 2019-10-14 ENCOUNTER — Other Ambulatory Visit: Payer: Self-pay

## 2019-10-14 DIAGNOSIS — C61 Malignant neoplasm of prostate: Secondary | ICD-10-CM | POA: Diagnosis not present

## 2019-10-15 ENCOUNTER — Other Ambulatory Visit: Payer: Self-pay

## 2019-10-15 ENCOUNTER — Ambulatory Visit
Admission: RE | Admit: 2019-10-15 | Discharge: 2019-10-15 | Disposition: A | Discharge status: Home / Self Care | Payer: Medicare HMO | Source: Ambulatory Visit | Attending: Radiation Oncology | Admitting: Radiation Oncology

## 2019-10-15 DIAGNOSIS — C61 Malignant neoplasm of prostate: Secondary | ICD-10-CM | POA: Diagnosis not present

## 2019-10-16 ENCOUNTER — Ambulatory Visit
Admission: RE | Admit: 2019-10-16 | Discharge: 2019-10-16 | Disposition: A | Discharge status: Home / Self Care | Payer: Medicare HMO | Source: Ambulatory Visit | Attending: Radiation Oncology | Admitting: Radiation Oncology

## 2019-10-16 ENCOUNTER — Other Ambulatory Visit: Payer: Self-pay

## 2019-10-16 DIAGNOSIS — C61 Malignant neoplasm of prostate: Secondary | ICD-10-CM | POA: Diagnosis not present

## 2019-10-17 ENCOUNTER — Ambulatory Visit: Payer: Medicare HMO

## 2019-10-17 ENCOUNTER — Ambulatory Visit
Admission: RE | Admit: 2019-10-17 | Discharge: 2019-10-17 | Disposition: A | Discharge status: Home / Self Care | Payer: Medicare HMO | Source: Ambulatory Visit | Attending: Radiation Oncology | Admitting: Radiation Oncology

## 2019-10-17 ENCOUNTER — Encounter: Payer: Self-pay | Admitting: Medical Oncology

## 2019-10-17 ENCOUNTER — Other Ambulatory Visit: Payer: Self-pay

## 2019-10-17 DIAGNOSIS — C61 Malignant neoplasm of prostate: Secondary | ICD-10-CM | POA: Diagnosis not present

## 2019-10-20 ENCOUNTER — Ambulatory Visit
Admission: RE | Admit: 2019-10-20 | Discharge: 2019-10-20 | Disposition: A | Discharge status: Home / Self Care | Payer: Medicare HMO | Source: Ambulatory Visit | Attending: Radiation Oncology | Admitting: Radiation Oncology

## 2019-10-20 ENCOUNTER — Other Ambulatory Visit: Payer: Self-pay

## 2019-10-20 DIAGNOSIS — C61 Malignant neoplasm of prostate: Secondary | ICD-10-CM | POA: Insufficient documentation

## 2019-10-21 ENCOUNTER — Other Ambulatory Visit: Payer: Self-pay

## 2019-10-21 ENCOUNTER — Ambulatory Visit
Admission: RE | Admit: 2019-10-21 | Discharge: 2019-10-21 | Disposition: A | Discharge status: Home / Self Care | Payer: Medicare HMO | Source: Ambulatory Visit | Attending: Radiation Oncology | Admitting: Radiation Oncology

## 2019-10-21 DIAGNOSIS — C61 Malignant neoplasm of prostate: Secondary | ICD-10-CM | POA: Diagnosis not present

## 2019-10-22 ENCOUNTER — Ambulatory Visit
Admission: RE | Admit: 2019-10-22 | Discharge: 2019-10-22 | Disposition: A | Discharge status: Home / Self Care | Payer: Medicare HMO | Source: Ambulatory Visit | Attending: Radiation Oncology | Admitting: Radiation Oncology

## 2019-10-22 ENCOUNTER — Other Ambulatory Visit: Payer: Self-pay

## 2019-10-22 DIAGNOSIS — C61 Malignant neoplasm of prostate: Secondary | ICD-10-CM | POA: Diagnosis not present

## 2019-10-23 ENCOUNTER — Ambulatory Visit: Payer: Medicare HMO

## 2019-10-23 ENCOUNTER — Ambulatory Visit
Admission: RE | Admit: 2019-10-23 | Discharge: 2019-10-23 | Disposition: A | Discharge status: Home / Self Care | Payer: Medicare HMO | Source: Ambulatory Visit | Attending: Radiation Oncology | Admitting: Radiation Oncology

## 2019-10-23 ENCOUNTER — Other Ambulatory Visit: Payer: Self-pay

## 2019-10-23 DIAGNOSIS — C61 Malignant neoplasm of prostate: Secondary | ICD-10-CM | POA: Diagnosis not present

## 2019-10-24 ENCOUNTER — Ambulatory Visit
Admission: RE | Admit: 2019-10-24 | Discharge: 2019-10-24 | Disposition: A | Discharge status: Home / Self Care | Payer: Medicare HMO | Source: Ambulatory Visit | Attending: Radiation Oncology | Admitting: Radiation Oncology

## 2019-10-24 ENCOUNTER — Other Ambulatory Visit: Payer: Self-pay

## 2019-10-24 DIAGNOSIS — C61 Malignant neoplasm of prostate: Secondary | ICD-10-CM | POA: Diagnosis not present

## 2019-10-25 ENCOUNTER — Ambulatory Visit: Payer: Medicare HMO | Attending: Internal Medicine

## 2019-10-25 ENCOUNTER — Ambulatory Visit: Payer: Medicare HMO

## 2019-10-25 DIAGNOSIS — Z23 Encounter for immunization: Secondary | ICD-10-CM | POA: Insufficient documentation

## 2019-10-25 NOTE — Progress Notes (Signed)
   Covid-19 Vaccination Clinic  Name:  Kevin Olson    MRN: [id number] DOB: July 14, 1946  10/25/2019  [name] was observed post Covid-19 immunization for 15 minutes without incidence. He was provided with Vaccine Information Sheet and instruction to access the V-Safe system.   [name] was instructed to call 911 with any severe reactions post vaccine: [name] Kitchen Difficulty breathing  . Swelling of your face and throat  . A fast heartbeat  . A bad rash all over your body  . Dizziness and weakness    Immunizations Administered    Name Date Dose VIS Date Route   Pfizer COVID-19 Vaccine 10/25/2019  5:36 PM 0.3 mL 08/29/2019 Intramuscular   Manufacturer: [place]   Lot: [id number]   [name]: [id number]

## 2019-10-27 ENCOUNTER — Ambulatory Visit
Admission: RE | Admit: 2019-10-27 | Discharge: 2019-10-27 | Disposition: A | Discharge status: Home / Self Care | Payer: Medicare HMO | Source: Ambulatory Visit | Attending: Radiation Oncology | Admitting: Radiation Oncology

## 2019-10-27 ENCOUNTER — Other Ambulatory Visit: Payer: Self-pay

## 2019-10-27 DIAGNOSIS — C61 Malignant neoplasm of prostate: Secondary | ICD-10-CM | POA: Diagnosis not present

## 2019-10-28 ENCOUNTER — Other Ambulatory Visit: Payer: Self-pay

## 2019-10-28 ENCOUNTER — Ambulatory Visit
Admission: RE | Admit: 2019-10-28 | Discharge: 2019-10-28 | Disposition: A | Discharge status: Home / Self Care | Payer: Medicare HMO | Source: Ambulatory Visit | Attending: Radiation Oncology | Admitting: Radiation Oncology

## 2019-10-28 DIAGNOSIS — C61 Malignant neoplasm of prostate: Secondary | ICD-10-CM | POA: Diagnosis not present

## 2019-10-29 ENCOUNTER — Ambulatory Visit
Admission: RE | Admit: 2019-10-29 | Discharge: 2019-10-29 | Disposition: A | Discharge status: Home / Self Care | Payer: Medicare HMO | Source: Ambulatory Visit | Attending: Radiation Oncology | Admitting: Radiation Oncology

## 2019-10-29 ENCOUNTER — Other Ambulatory Visit: Payer: Self-pay

## 2019-10-29 DIAGNOSIS — C61 Malignant neoplasm of prostate: Secondary | ICD-10-CM | POA: Diagnosis not present

## 2019-10-30 ENCOUNTER — Ambulatory Visit
Admission: RE | Admit: 2019-10-30 | Discharge: 2019-10-30 | Disposition: A | Discharge status: Home / Self Care | Payer: Medicare HMO | Source: Ambulatory Visit | Attending: Radiation Oncology | Admitting: Radiation Oncology

## 2019-10-30 ENCOUNTER — Other Ambulatory Visit: Payer: Self-pay

## 2019-10-30 DIAGNOSIS — C61 Malignant neoplasm of prostate: Secondary | ICD-10-CM | POA: Diagnosis not present

## 2019-10-31 ENCOUNTER — Other Ambulatory Visit: Payer: Self-pay

## 2019-10-31 ENCOUNTER — Ambulatory Visit
Admission: RE | Admit: 2019-10-31 | Discharge: 2019-10-31 | Disposition: A | Discharge status: Home / Self Care | Payer: Medicare HMO | Source: Ambulatory Visit | Attending: Radiation Oncology | Admitting: Radiation Oncology

## 2019-10-31 DIAGNOSIS — C61 Malignant neoplasm of prostate: Secondary | ICD-10-CM | POA: Diagnosis not present

## 2019-11-03 ENCOUNTER — Other Ambulatory Visit: Payer: Self-pay

## 2019-11-03 ENCOUNTER — Ambulatory Visit
Admission: RE | Admit: 2019-11-03 | Discharge: 2019-11-03 | Disposition: A | Discharge status: Home / Self Care | Payer: Medicare HMO | Source: Ambulatory Visit | Attending: Radiation Oncology | Admitting: Radiation Oncology

## 2019-11-03 DIAGNOSIS — C61 Malignant neoplasm of prostate: Secondary | ICD-10-CM | POA: Diagnosis not present

## 2019-11-04 ENCOUNTER — Ambulatory Visit
Admission: RE | Admit: 2019-11-04 | Discharge: 2019-11-04 | Disposition: A | Discharge status: Home / Self Care | Payer: Medicare HMO | Source: Ambulatory Visit | Attending: Radiation Oncology | Admitting: Radiation Oncology

## 2019-11-04 ENCOUNTER — Other Ambulatory Visit: Payer: Self-pay

## 2019-11-04 DIAGNOSIS — C61 Malignant neoplasm of prostate: Secondary | ICD-10-CM | POA: Diagnosis not present

## 2019-11-05 ENCOUNTER — Other Ambulatory Visit: Payer: Self-pay

## 2019-11-05 ENCOUNTER — Encounter: Payer: Self-pay | Admitting: Radiation Oncology

## 2019-11-05 ENCOUNTER — Ambulatory Visit
Admission: RE | Admit: 2019-11-05 | Discharge: 2019-11-05 | Disposition: A | Discharge status: Home / Self Care | Payer: Medicare HMO | Source: Ambulatory Visit | Attending: Radiation Oncology | Admitting: Radiation Oncology

## 2019-11-05 DIAGNOSIS — C61 Malignant neoplasm of prostate: Secondary | ICD-10-CM | POA: Diagnosis not present

## 2019-11-07 ENCOUNTER — Ambulatory Visit: Payer: Medicare HMO

## 2019-11-16 NOTE — Progress Notes (Signed)
  Radiation Oncology         [phone] ________________________________  Name: Mckinney Poplar MRN: [id number]  Date: 11/05/2019  DOB: 1946-03-29  End of Treatment Note  Diagnosis:   74 y.o. gentleman with Stage T2a adenocarcinoma of the prostate with Gleason score of 4+3, and PSA of 17.1     Indication for treatment:  Curative, Definitive Radiotherapy       Radiation treatment dates:   09/29/19-11/05/19  Site/dose:   The prostate was treated to 70 Gy in 28 fractions of 2.5 Gy  Beams/energy:   The patient was treated with IMRT using volumetric arc therapy delivering 6 MV X-rays to clockwise and counterclockwise circumferential arcs with a 90 degree collimator offset to avoid dose scalloping.  Image guidance was performed with daily cone beam CT prior to each fraction to align to gold markers in the prostate and assure proper bladder and rectal fill volumes.  Immobilization was achieved with BodyFix custom mold.  Narrative: The patient tolerated radiation treatment relatively well.   The patient experienced some minor urinary irritation and modest fatigue.    Plan: The patient has completed radiation treatment. He will return to radiation oncology clinic for routine followup in one month. I advised him to call or return sooner if he has any questions or concerns related to his recovery or treatment. ________________________________  Sheral Apley. Tammi Klippel, M.D.

## 2019-11-19 ENCOUNTER — Ambulatory Visit: Payer: Medicare HMO | Attending: Internal Medicine

## 2019-11-19 DIAGNOSIS — Z23 Encounter for immunization: Secondary | ICD-10-CM | POA: Insufficient documentation

## 2019-11-19 NOTE — Progress Notes (Signed)
   Covid-19 Vaccination Clinic  Name:  Jowel Mccormac    MRN: [id number] DOB: Oct 23, 1945  11/19/2019  Mr. Tai was observed post Covid-19 immunization for 15 minutes without incident. He was provided with Vaccine Information Sheet and instruction to access the V-Safe system.   Mr. Dunaway was instructed to call 911 with any severe reactions post vaccine: [name] Kitchen Difficulty breathing  . Swelling of face and throat  . A fast heartbeat  . A bad rash all over body  . Dizziness and weakness   Immunizations Administered    Name Date Dose VIS Date Route   Pfizer COVID-19 Vaccine 11/19/2019  2:44 PM 0.3 mL 08/29/2019 Intramuscular   Manufacturer: [place]   Lot: [id number]   [place]: [id number]

## 2019-11-24 ENCOUNTER — Ambulatory Visit: Payer: Medicare HMO | Admitting: Internal Medicine

## 2019-11-24 ENCOUNTER — Encounter: Payer: Self-pay | Admitting: Internal Medicine

## 2019-11-24 ENCOUNTER — Other Ambulatory Visit: Payer: Self-pay

## 2019-11-24 VITALS — BP 136/58 | HR 77 | Temp 96.4°F | Ht 70.0 in | Wt 244.0 lb

## 2019-11-24 DIAGNOSIS — R0602 Shortness of breath: Secondary | ICD-10-CM

## 2019-11-24 DIAGNOSIS — E7849 Other hyperlipidemia: Secondary | ICD-10-CM | POA: Diagnosis not present

## 2019-11-24 DIAGNOSIS — Z01812 Encounter for preprocedural laboratory examination: Secondary | ICD-10-CM

## 2019-11-24 DIAGNOSIS — R5383 Other fatigue: Secondary | ICD-10-CM

## 2019-11-24 DIAGNOSIS — Z8249 Family history of ischemic heart disease and other diseases of the circulatory system: Secondary | ICD-10-CM | POA: Diagnosis not present

## 2019-11-24 DIAGNOSIS — Z01810 Encounter for preprocedural cardiovascular examination: Secondary | ICD-10-CM | POA: Diagnosis not present

## 2019-11-24 MED ORDER — METOPROLOL TARTRATE 100 MG PO TABS: ORAL_TABLET | ORAL | 0 refills | Status: DC

## 2019-11-24 NOTE — Progress Notes (Signed)
LIPID CLINIC CONSULT NOTE  Chief Complaint:  Manage dyslipidemia, fatigue/dyspnea  Primary Care Physician: Kevin Graff, PA-C  Primary Cardiologist:  No primary care provider on file.  HPI:  Kevin Olson is a 74 y.o. male who is being seen today for the evaluation of dyslipidemia at the request of Kevin Hines, PA-C. This is a 74 year old male with a history of dyslipidemia and recent diagnosis of prostate cancer status post radiation therapy.  He is about a month out from that.  He was referred for persistent dyslipidemia and risk factor modification.  Recent labs in September 2020 showed total cholesterol 342, triglycerides 394, LDL of 216 and HDL 47.  This is on atorvastatin 80 mg daily which she reports being compliant with.  He says that he has a pretty atherogenic diet.  He eats a lot of sausage and gravy biscuits and other high-fat foods which she has been trying to work on.  Of note that he does have a significant family history of heart disease, concerning that both his oldest and youngest brothers had coronary artery bypass grafting x4.  His mother also had heart disease and high cholesterol.  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  Moreover, today he is reported progressive fatigue and dyspnea.  This started actually prior to his radiation therapy for the prostate however of course intermittently worsened somewhat although is improving.  He says that doing outdoor work such as clean up stuff with his chainsaw and other things in the backyard he is noted progressive fatigue, weakness and exercise intolerance.  This is probably been over the past 6 months to a year.  He denies any chest pain but does get short of breath with exertion.  He reports a very remote stress test but has not been unable to exercise due to knee osteoarthritis.  PMHx:  Past Medical History:  Diagnosis Date  . DM2 (diabetes mellitus, type 2) ([place])   . Dyslipidemia   .  Hypertension   . OA (osteoarthritis) of knee   . Prostate cancer [facility]     Past Surgical History:  Procedure Laterality Date  . INGUINAL HERNIA REPAIR    . PROSTATE BIOPSY      FAMHx:  Family History  Problem Relation Age of Onset  . Pancreatic cancer Sister   . Other Sister        brain tumor  . CAD Mother   . CAD Brother        CABGx4   . CAD Brother        CABGx4  . Colon cancer Neg Hx   . Breast cancer Neg Hx     SOCHx:   reports that he quit smoking about 40 years ago. His smoking use included cigarettes. He has a 24.00 pack-year smoking history. He has never used smokeless tobacco. He reports previous alcohol use. He reports that he does not use drugs.  ALLERGIES:  No Known Allergies  ROS: Pertinent items noted in HPI and remainder of comprehensive ROS otherwise negative.  HOME MEDS: Current Outpatient Medications on File Prior to Visit  Medication Sig Dispense Refill  . ALPRAZolam (XANAX) 1 MG tablet alprazolam 1 mg tablet    . atorvastatin (LIPITOR) 80 MG tablet     . baclofen (LIORESAL) 10 MG tablet baclofen 10 mg tablet    . Blood Glucose Monitoring Suppl (TRUE METRIX AIR GLUCOSE METER) DEVI True Metrix Air Glucose Meter kit    . celecoxib (CELEBREX) 200 MG  capsule Take 400 mg by mouth daily.     . Coenzyme Q10 (CO Q 10 PO) Take 1 tablet by mouth daily.    . DULoxetine (CYMBALTA) 60 MG capsule duloxetine 60 mg capsule,delayed release    . glimepiride (AMARYL) 4 MG tablet glimepiride 4 mg tablet    . glucose blood (TRUE METRIX BLOOD GLUCOSE TEST) test strip True Metrix Glucose Test Strip    . levothyroxine (SYNTHROID) 100 MCG tablet     . losartan (COZAAR) 25 MG tablet losartan 25 mg tablet    . metFORMIN (GLUCOPHAGE) 1000 MG tablet metformin 1,000 mg tablet    . Multiple Vitamins-Minerals (CENTRUM MEN PO) Take by mouth.    . pioglitazone (ACTOS) 15 MG tablet pioglitazone 15 mg tablet    . tamsulosin (FLOMAX) 0.4 MG CAPS capsule tamsulosin 0.4 mg capsule     . TRUEplus Lancets 33G MISC TRUEplus Lancets 33 gauge    . TURMERIC PO Take 1 tablet by mouth daily.     No current facility-administered medications on file prior to visit.    LABS/IMAGING: No results found for this or any previous visit (from the past 48 hour(s)). No results found.  LIPID PANEL: No results found for: CHOL, TRIG, HDL, CHOLHDL, VLDL, LDLCALC, LDLDIRECT  WEIGHTS: Wt Readings from Last 3 Encounters:  11/24/19 244 lb (110.7 kg)  07/22/19 230 lb (104.3 kg)  07/03/19 230 lb (104.3 kg)    VITALS: BP (!) 136/58 (BP Location: Left Arm, Patient Position: Sitting, Cuff Size: Normal)   Pulse 77   Temp (!) 96.4 F (35.8 C)   Ht 5' 10"  (1.778 m)   Wt 244 lb (110.7 kg)   BMI 35.01 kg/m   EXAM: General appearance: alert, no distress and moderately obese Neck: no carotid bruit, no JVD and thyroid not enlarged, symmetric, no tenderness/mass/nodules Lungs: clear to auscultation bilaterally Heart: regular rate and rhythm Abdomen: soft, non-tender; bowel sounds normal; no masses,  no organomegaly Extremities: extremities normal, atraumatic, no cyanosis or edema Pulses: 2+ and symmetric Skin: Skin color, texture, turgor normal. No rashes or lesions Neurologic: Grossly normal Psych: Pleasant  EKG: Deferred  ASSESSMENT: 1. Progressive fatigue/dyspnea 2. Marked dyslipidemia, suggestive of familial hyperlipidemia 3. Type 2 diabetes 4. Hypertension 5. Moderate obesity 6. Prostate cancer status post radiation therapy 7. Family history of premature onset coronary disease  PLAN: 1.   Kevin Olson has progressive fatigue and dyspnea which is predated his radiation therapy and I suspect could represent coronary disease.  He has multiple coronary risk factors include type 2 diabetes, hypertension, moderate obesity, strong family history of coronary disease in both brothers and his mother as well as a very high LDL cholesterol.  In fact his LDL is likely over 300 without  therapy.  At this point he is LDL is 181 on high potency statin however his target LDL is less than 70.  We will need additional therapy to reach that target and is a good candidate for PCSK9 inhibitor.  Would recommend Repatha 140 mg every 2 weeks.  We will get prior authorization for that.  In addition because of his progressive coronary symptoms, I would recommend a CT coronary angiogram to assess for possible obstructive coronary disease or multivessel disease.  Plan follow-up with me afterwards.  Thanks again for the kind referral.  Pixie Casino, MD, FACC, [place] Director of the [facility] Lipid Disorders &  Cardiovascular Risk Reduction Clinic Diplomate of the American Board  of Clinical Lipidology Attending Cardiologist  Direct Dial: [phone]  Fax: [phone]  Website:  [email or web address] [email or web address] Chee Kinslow 11/24/2019, 1:39 PM

## 2019-11-24 NOTE — Patient Instructions (Addendum)
Medication Instructions:  Dr. Debara Pickett recommends Repatha (PCSK9). This is an injectable cholesterol medication self-administered. This medication will need prior approval with your insurance company, which we will work on. If the medication is not approved initially, we may need to do an appeal with your insurance. We will keep you updated on this process. You will need a red sharps container for proper disposal and your pharmacy can direct you on this.   If you need co-pay assistance, please look into the program at healthwellfoundation.org >> disease funds >> hypercholesterolemia. This is an online application or you can call to complete.   If you need a co-pay card for Repatha: [email or web address]-[email or web address] >> paying for Repatha If you need a co-pay card for Praluent: WedMap.it >> starting & paying for Praluent  *If you need a refill on your cardiac medications before your next appointment, please call your pharmacy*   Lab Work:  BMET (non-fasting lab) to be completed about 1 week prior to CT test  FASTING lab work in 3-4 months to check cholesterol   If you have labs (blood work) drawn today and your tests are completely normal, you will receive your results only by: [name] Kitchen MyChart Message (if you have MyChart) OR . A paper copy in the mail If you have any lab test that is abnormal or we need to change your treatment, we will call you to review the results.   Testing/Procedures: Coronary CT study done at [facility] This will be pre-certified with your insurance first and then you will be called to schedule   Follow-Up: At [facility], you and your health needs are our priority.  As part of our continuing mission to provide you with exceptional heart care, we have created designated Provider Care Teams.  These Care Teams include your primary Cardiologist (physician) and Advanced Practice Providers (APPs -  Physician Assistants and Nurse Practitioners) who all work together to provide you with the  care you need, when you need it.  We recommend signing up for the patient portal called "MyChart".  Sign up information is provided on this After Visit Summary.  MyChart is used to connect with patients for Virtual Visits (Telemedicine).  Patients are able to view lab/test results, encounter notes, upcoming appointments, etc.  Non-urgent messages can be sent to your provider as well.   To learn more about what you can do with MyChart, go to [email or web address].    Your next appointment:   After your CT test  The format for your next appointment:   In Person  Provider:   K. Mali Hilty, MD   Other Instructions  Your cardiac CT will be scheduled at one of the below locations:   [facility] [address] [place], [place] [address] [phone]   If scheduled at [facility], please arrive at the [facility] main entrance of [facility] 30 minutes prior to test start time. Proceed to the [facility] Radiology Department (first floor) to check-in and test prep.   Please follow these instructions carefully (unless otherwise directed):  Hold all erectile dysfunction medications at least 3 days (72 hrs) prior to test.  On the Night Before the Test: . Be sure to Drink plenty of water. . Do not consume any caffeinated/decaffeinated beverages or chocolate 12 hours prior to your test. . Do not take any antihistamines 12 hours prior to your test. . If you take Metformin do not take 24 hours prior to test.  On the Day  of the Test: . Drink plenty of water. Do not drink any water within one hour of the test. . Do not eat any food 4 hours prior to the test. . You may take your regular medications prior to the test.  . Take metoprolol (Lopressor) two hours prior to test. . HOLD Furosemide/Hydrochlorothiazide morning of the test.      After the Test: . Drink plenty of water. . After receiving IV contrast, you may experience a mild flushed feeling. This is  normal. . On occasion, you may experience a mild rash up to 24 hours after the test. This is not dangerous. If this occurs, you can take Benadryl 25 mg and increase your fluid intake. . If you experience trouble breathing, this can be serious. If it is severe call 911 IMMEDIATELY. If it is mild, please call our office. . If you take any of these medications: Glipizide/Metformin, Avandament, Glucavance, please do not take 48 hours after completing test unless otherwise instructed.   Once we have confirmed authorization from your insurance company, we will call you to set up a date and time for your test.   For non-scheduling related questions, please contact the cardiac imaging nurse navigator should you have any questions/concerns: Marchia Bond, RN Navigator Cardiac Imaging [facility] [phone] office

## 2019-11-25 ENCOUNTER — Telehealth: Payer: Self-pay | Admitting: Internal Medicine

## 2019-11-25 MED ORDER — REPATHA SURECLICK 140 MG/ML ~~LOC~~ SOAJ: 1.0000 | SUBCUTANEOUS | 11 refills | Status: DC

## 2019-11-25 NOTE — Telephone Encounter (Signed)
PA for Repatha submitted via CMM (Key: [id number])

## 2019-11-25 NOTE — Telephone Encounter (Signed)
PA Case: [id number], Status: Approved, Coverage Starts on: 11/25/2019 12:00:00 AM, Coverage Ends on: 05/23/2020 12:00:00 AM  Patient notified via MyChart

## 2019-11-25 NOTE — Addendum Note (Signed)
Addended by: Fidel Levy on: 11/25/2019 01:25 PM   Modules accepted: Orders

## 2019-11-25 NOTE — Telephone Encounter (Signed)
Rx sent to [facility] in [facility] per patient request

## 2019-12-02 ENCOUNTER — Other Ambulatory Visit: Payer: Self-pay

## 2019-12-02 NOTE — Progress Notes (Signed)
Patient reports urgency and frequency, with nocturia of atleast 3 times nightly. Unable to hold urine states he often goes less than every 2 hours. Denies any dysuria, hematuria, or burning with urination.

## 2019-12-03 ENCOUNTER — Ambulatory Visit
Admission: RE | Admit: 2019-12-03 | Discharge: 2019-12-03 | Disposition: A | Discharge status: Home / Self Care | Payer: Medicare HMO | Source: Ambulatory Visit | Attending: Urology | Admitting: Urology

## 2019-12-03 ENCOUNTER — Telehealth: Payer: Self-pay

## 2019-12-03 ENCOUNTER — Other Ambulatory Visit: Payer: Self-pay

## 2019-12-03 DIAGNOSIS — C61 Malignant neoplasm of prostate: Secondary | ICD-10-CM

## 2019-12-03 NOTE — Progress Notes (Signed)
Radiation Oncology         [phone] ________________________________  Name: Kevin Olson MRN: [id number]  Date: 12/03/2019  DOB: [birth year]  Post Treatment Note  CC: Aurea Graff, PA-C  Pa, [facility]*  Diagnosis:   74 y.o. gentleman with Stage T2a adenocarcinoma of the prostate with Gleason score of 4+3, and PSA of 17.1    Interval Since Last Radiation:  4 weeks (concurrent with ST-ADT with Eligard 07/27/20) 09/29/19-11/05/19:  The prostate was treated to 70 Gy in 28 fractions of 2.5 Gy   Narrative:  I spoke with the patient to conduct his routine scheduled 1 month follow up visit via telephone to spare the patient unnecessary potential exposure in the healthcare setting during the current COVID-19 pandemic.  The patient was notified in advance and gave permission to proceed with this visit format. He tolerated radiation treatment relatively well with only  minor urinary irritation and modest fatigue.  He noted mild increased urgency and nocturia x3/night but denied any dysuria, excessive daytime frequency, incomplete bladder emptying, hematuria or incontinence.                           On review of systems, the patient states that he is doing very well in general.  He has mild residual weak stream and occasional hesitancy but specifically denies dysuria, gross hematuria, suprapubic discomfort, fever, chills or night sweats.  Post-treatment IPSS is 20 with weak stream, incomplete bladder emptying, urgency and nocturia x3/night. He has continued taking tamsulosin daily.   Pre-treatment IPSS was 18 despite tamsulosin. He reports a healthy appetite and is maintaining his weight. He has tolerated the ADT very well without hot flashes or significant fatigue. Overall, he is extremely pleased with his progress to date.  ALLERGIES:  has No Known Allergies.  Meds: Current Outpatient Medications  Medication Sig Dispense Refill  . ALPRAZolam (XANAX) 1 MG tablet alprazolam 1 mg tablet     . atorvastatin (LIPITOR) 80 MG tablet     . baclofen (LIORESAL) 10 MG tablet baclofen 10 mg tablet    . Blood Glucose Monitoring Suppl (TRUE METRIX AIR GLUCOSE METER) DEVI True Metrix Air Glucose Meter kit    . celecoxib (CELEBREX) 200 MG capsule Take 400 mg by mouth daily.     . Coenzyme Q10 (CO Q 10 PO) Take 1 tablet by mouth daily.    . DULoxetine (CYMBALTA) 60 MG capsule duloxetine 60 mg capsule,delayed release    . Evolocumab (REPATHA SURECLICK) 336 MG/ML SOAJ Inject 1 Dose into the skin every 14 (fourteen) days. 2 pen 11  . glimepiride (AMARYL) 4 MG tablet glimepiride 4 mg tablet    . glucose blood (TRUE METRIX BLOOD GLUCOSE TEST) test strip True Metrix Glucose Test Strip    . levothyroxine (SYNTHROID) 100 MCG tablet     . losartan (COZAAR) 25 MG tablet losartan 25 mg tablet    . metFORMIN (GLUCOPHAGE) 1000 MG tablet metformin 1,000 mg tablet    . metoprolol tartrate (LOPRESSOR) 100 MG tablet Take ONE tablet TWO HOURS prior to test. 1 tablet 0  . Multiple Vitamins-Minerals (CENTRUM MEN PO) Take by mouth.    . pioglitazone (ACTOS) 15 MG tablet pioglitazone 15 mg tablet    . tamsulosin (FLOMAX) 0.4 MG CAPS capsule tamsulosin 0.4 mg capsule    . TRUEplus Lancets 33G MISC TRUEplus Lancets 33 gauge    . TURMERIC PO Take 1 tablet by mouth daily.  No current facility-administered medications for this encounter.    Physical Findings:  vitals were not taken for this visit.   /Unable to assess due to telephone follow up visit format.  Lab Findings: No results found for: WBC, HGB, HCT, MCV, PLT   Radiographic Findings: No results found.  Impression/Plan: 1. 74 y.o. gentleman with Stage T2a adenocarcinoma of the prostate with Gleason score of 4+3, and PSA of 17.1     He will continue to follow up with urology for ongoing PSA determinations and has an appointment scheduled with Dr. Junious Silk on 12/30/19. He understands what to expect with regards to PSA monitoring going forward. I  will look forward to following his response to treatment via correspondence with urology, and would be happy to continue to participate in his care if clinically indicated. I talked to the patient about what to expect in the future, including his risk for erectile dysfunction and rectal bleeding. I encouraged him to call or return to the office if he has any questions regarding his previous radiation or possible radiation side effects. He was comfortable with this plan and will follow up as needed.  Today, a comprehensive survivorship care plan and treatment summary was reviewed with the patient detailing his prostate cancer diagnosis, treatment course, potential late/long-term effects of treatment, appropriate follow-up care with recommendations for the future, and patient education resources.  A copy of this summary, along with a letter will be sent to the patient's primary care provider via fax after today's visit.   2. Cancer screening:  Due to Mr. Benecke's history and his age, he should receive screening for skin cancers, colon cancer, and lung cancer.  The information and recommendations are listed on the patient's comprehensive care plan/treatment summary and were reviewed in detail with the patient.     3. Health maintenance and wellness promotion: Mr. Neidert was encouraged to consume 5-7 servings of fruits and vegetables per day. He was provided a copy of the "Nutrition Rainbow" handout, as well as the handout "Take Control of Your Health and [place]" from the [place].  He was also encouraged to engage in moderate to vigorous exercise for 30 minutes per day most days of the week. Information was provided regarding the [employer] fitness program, which is designed for cancer survivors to help them become more physically fit after cancer treatments. We discussed that a healthy BMI is 18.5-24.9 and that maintaining a healthy weight reduces risk of cancer recurrences.   He was instructed to limit his alcohol consumption and continue to abstain from tobacco use.  Lastly, he was encouraged to use sunscreen and wear protective clothing when in the sun.     4. Support services/counseling: It is not uncommon for this period of the patient's cancer care trajectory to be one of many emotions and stressors.  Mr. Wrede was encouraged to take advantage of our many support services programs, support groups, and/or counseling in coping with his new life as a cancer survivor after completing anti-cancer treatment.  He was offered support today through active listening and expressive supportive counseling.  He was given information regarding our available services and encouraged to contact me with any questions or for help enrolling in any of our support group/programs.       Nicholos Johns, PA-C

## 2019-12-03 NOTE — Telephone Encounter (Signed)
Spoke with patient to remind of telephone encounter today verbalized he understood this is a telephone encounter

## 2019-12-05 NOTE — Addendum Note (Signed)
Encounter addended by: Amparo Bristol, LPN on: [id number] AM  Actions taken: Flowsheet accepted

## 2019-12-11 ENCOUNTER — Telehealth (HOSPITAL_COMMUNITY): Payer: Self-pay | Admitting: Emergency Medicine

## 2019-12-11 ENCOUNTER — Encounter (HOSPITAL_COMMUNITY): Payer: Self-pay

## 2019-12-11 DIAGNOSIS — R0602 Shortness of breath: Secondary | ICD-10-CM | POA: Diagnosis not present

## 2019-12-11 DIAGNOSIS — Z01812 Encounter for preprocedural laboratory examination: Secondary | ICD-10-CM | POA: Diagnosis not present

## 2019-12-11 NOTE — Telephone Encounter (Signed)
Reaching out to patient to offer assistance regarding upcoming cardiac imaging study; pt verbalizes understanding of appt date/time, parking situation and where to check in, pre-test NPO status and medications ordered, and verified current allergies; name and call back number provided for further questions should they arise Ericah Scotto RN Navigator Cardiac Imaging [place] [facility] [phone] office [phone] cell 

## 2019-12-12 ENCOUNTER — Other Ambulatory Visit: Payer: Self-pay

## 2019-12-12 ENCOUNTER — Ambulatory Visit (HOSPITAL_COMMUNITY)
Admission: RE | Admit: 2019-12-12 | Discharge: 2019-12-12 | Disposition: A | Discharge status: Home / Self Care | Payer: Medicare HMO | Source: Ambulatory Visit | Attending: Internal Medicine | Admitting: Internal Medicine

## 2019-12-12 ENCOUNTER — Encounter (HOSPITAL_COMMUNITY): Payer: Self-pay

## 2019-12-12 DIAGNOSIS — I700 Atherosclerosis of aorta: Secondary | ICD-10-CM | POA: Diagnosis not present

## 2019-12-12 DIAGNOSIS — Z8249 Family history of ischemic heart disease and other diseases of the circulatory system: Secondary | ICD-10-CM

## 2019-12-12 DIAGNOSIS — R0602 Shortness of breath: Secondary | ICD-10-CM | POA: Diagnosis not present

## 2019-12-12 DIAGNOSIS — I2510 Atherosclerotic heart disease of native coronary artery without angina pectoris: Secondary | ICD-10-CM | POA: Insufficient documentation

## 2019-12-12 DIAGNOSIS — R5383 Other fatigue: Secondary | ICD-10-CM

## 2019-12-12 LAB — BASIC METABOLIC PANEL
BUN/Creatinine Ratio: 25 — ABNORMAL HIGH (ref 10–24)
BUN: 17 mg/dL (ref 8–27)
CO2: 26 mmol/L (ref 20–29)
Calcium: 9.7 mg/dL (ref 8.6–10.2)
Chloride: 105 mmol/L (ref 96–106)
Creatinine, Ser: 0.69 mg/dL — ABNORMAL LOW (ref 0.76–1.27)
GFR calc Af Amer: 110 mL/min/{1.73_m2} (ref >59)
GFR calc non Af Amer: 95 mL/min/{1.73_m2} (ref >59)
Glucose: 234 mg/dL — ABNORMAL HIGH (ref 65–99)
Potassium: 4.8 mmol/L (ref 3.5–5.2)
Sodium: 143 mmol/L (ref 134–144)

## 2019-12-12 IMAGING — CT CT HEART MORP W/ CTA COR W/ SCORE W/ CA W/CM &/OR W/O CM
4 of 7 series · 8 of 20 positions shown, 9 images · IV contrast (APPLIED)
Comparison: None.
COMPARISON: None.

Addendum:
EXAM:
OVER-READ INTERPRETATION  CT CHEST

The following report is an over-read performed by radiologist Dr.
Nekie Reina [REDACTED] on 12/12/2019. This
over-read does not include interpretation of cardiac or coronary
anatomy or pathology. The coronary calcium score/coronary CTA
interpretation by the cardiologist is attached.
HISTORY: 74 yo male with fatigue, shortness of breath, family history of
heart disease
Cardiac/Coronary CTA
TECHNIQUE: The patient was scanned on a Siemens Force scanner.
PROTOCOL: A 120 kV prospective scan was triggered in the descending thoracic
aorta at 111 HU's. Axial non-contrast 3 mm slices were carried out
through the heart. The data set was analyzed on a dedicated work
station and scored using the Agatson method. Gantry rotation speed
was 250 msecs and collimation was .6 mm. Beta blockade and 0.8 mg of
sl NTG was given. The 3D data set was reconstructed in 5% intervals
of the 67-82 % of the R-R cycle. Diastolic phases were analyzed on a
dedicated work station using MPR, MIP and VRT modes. The patient
received 80mL OMNIPAQUE IOHEXOL 350 MG/ML SOLN of contrast.

[Series 6: best diast 68 % · axial · 0.39mm/px · z∈[+1356,+1389]mm · 2 of 248 slices shown]
[im 83/248  vessel]
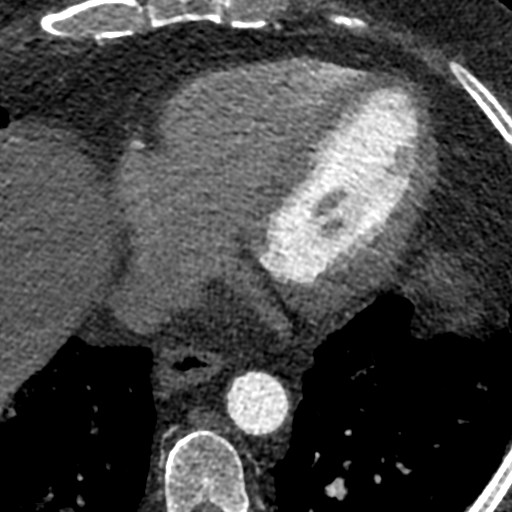
[im 165/248  vessel]
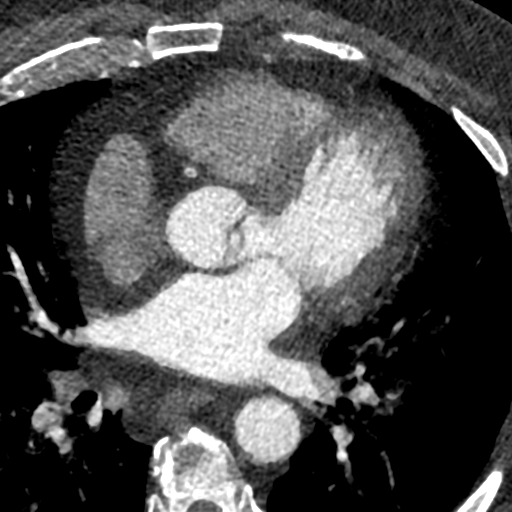

[Series 7: best syst · axial · 0.39mm/px · z∈[+1356,+1389]mm · 2 of 248 slices shown, 3 images]
[im 83/248  vessel]
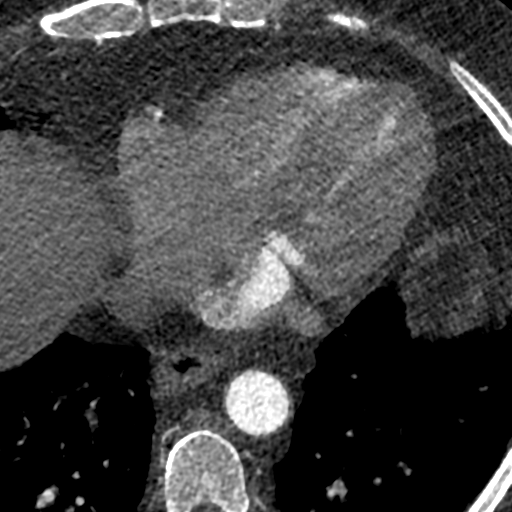
[im 83/248  lung]
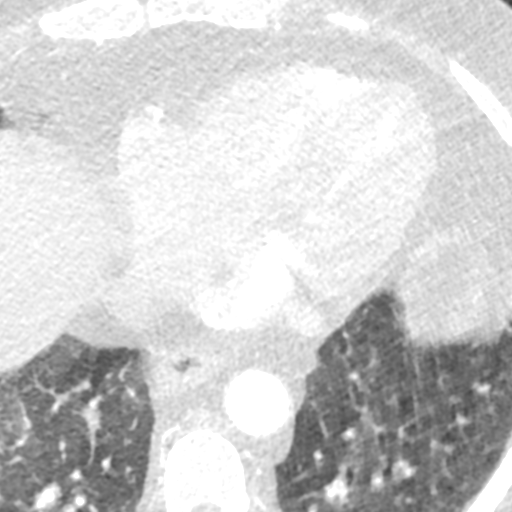
[im 165/248  vessel]
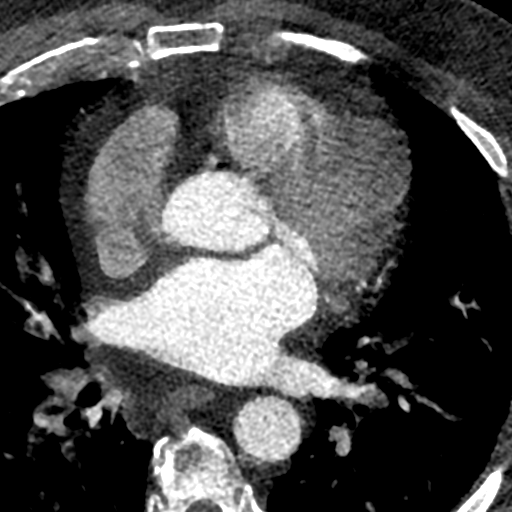

[Series 8: ts diast sharp 38 % · axial · 0.39mm/px · z∈[+1356,+1389]mm · 2 of 248 slices shown]
[im 83/248  lung]
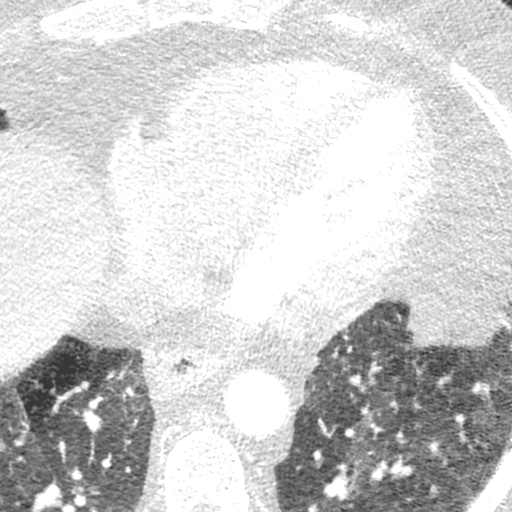
[im 165/248  lung]
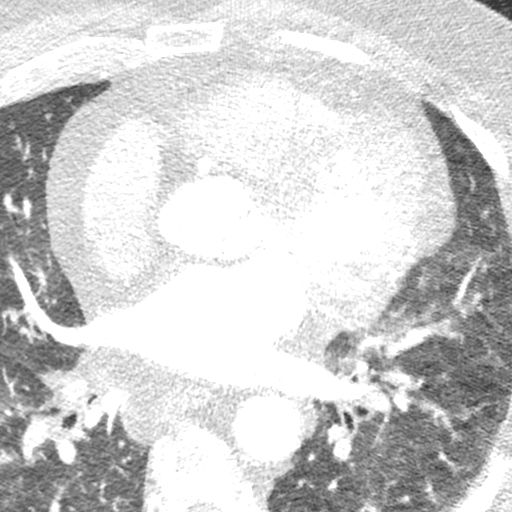

[Series 9: ts syst sharp 38 % · axial · 0.39mm/px · z∈[+1356,+1389]mm · 2 of 248 slices shown]
[im 83/248  lung]
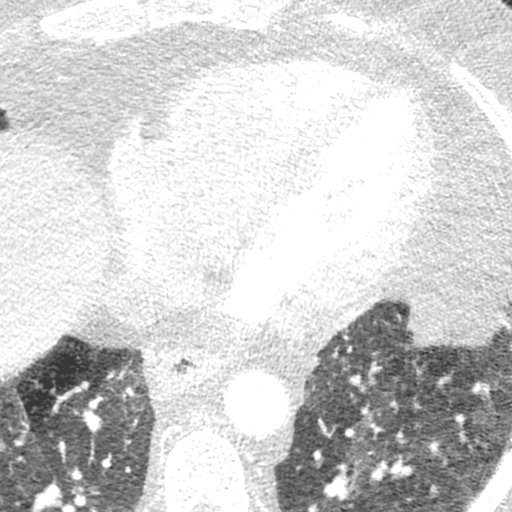
[im 165/248  lung]
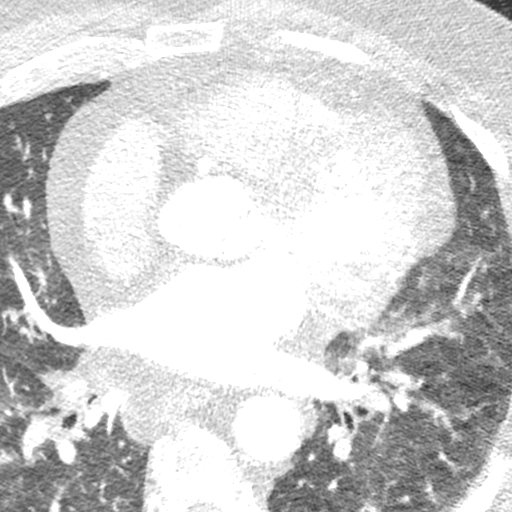

[8 of 20 positions shown; findings below may reference images not displayed]

FINDINGS: Aortic atherosclerosis. Within the visualized portions of the thorax
there are no suspicious appearing pulmonary nodules or masses, there
is no acute consolidative airspace disease, no pleural effusions, no
pneumothorax and no lymphadenopathy. Visualized portions of the
upper abdomen are unremarkable. There are no aggressive appearing
lytic or blastic lesions noted in the visualized portions of the
skeleton.
IMPRESSION: 1.  Aortic Atherosclerosis (ULGVN-ZBW.W).
FINDINGS: Quality: Good (HR 43)

Coronary calcium score: The patient's coronary artery calcium score
is 250, which places the patient in the 56th percentile.

Coronary arteries: Normal coronary origins.  Right dominance.

Right Coronary Artery: Dominant vessel. Ostial calcification. There
is a mild 25-49% non-calcified proximal stenosis (SGW1GWCN). No
significant distal disease.

Left Main Coronary Artery: Mild non-calcified stenosis (SGW1GWCN).
Bifurcates into the LAD and LCx arteries.

Left Anterior Descending Coronary Artery: Does not reach the apex.
Long 30 mm segment of mixed coronary disease with heavy
calcification and mild to moderate 50-69% ([id number]), probably
non-obstructive disease. The mid to distal LAD disappears - not
clear if this is intramyocardial or occluded.

Left Circumflex Artery: Large circumflex vessel, the mid segment is
not well visualized - there is no obstruction to the distal segment.

Aorta: Normal size, 33 mm at the mid ascending aorta (level of the
PA bifurcation) measured double oblique. Aortic atherosclerosis. No
dissection.

Aortic Valve: Trileaflet.  Punctate leaflet calcification.

Other findings:

Normal pulmonary vein drainage into the left atrium.

Normal left atrial appendage without a thrombus.

Normal size of the pulmonary artery.
IMPRESSION: 1. Mild to moderate mixed non-obstructive CAD, CADRADS = 3. The mid
to distal LAD is not well-visualized and may be occluded.

2.  CT FFR will be performed and reported separately.

3. Coronary calcium score of 250. This was 56th percentile for age
and sex matched control. Predominantly LAD plaque.

4. Normal coronary origin with right dominance.

*** End of Addendum ***
EXAM:
OVER-READ INTERPRETATION  CT CHEST

The following report is an over-read performed by radiologist Dr.
Nekie Reina [REDACTED] on 12/12/2019. This
over-read does not include interpretation of cardiac or coronary
anatomy or pathology. The coronary calcium score/coronary CTA
interpretation by the cardiologist is attached.
FINDINGS: Aortic atherosclerosis. Within the visualized portions of the thorax
there are no suspicious appearing pulmonary nodules or masses, there
is no acute consolidative airspace disease, no pleural effusions, no
pneumothorax and no lymphadenopathy. Visualized portions of the
upper abdomen are unremarkable. There are no aggressive appearing
lytic or blastic lesions noted in the visualized portions of the
skeleton.
IMPRESSION: 1.  Aortic Atherosclerosis (ULGVN-ZBW.W).

## 2019-12-12 MED ORDER — NITROGLYCERIN 0.4 MG SL SUBL
SUBLINGUAL_TABLET | SUBLINGUAL | Status: AC
  Filled 2019-12-12: qty 2

## 2019-12-12 MED ORDER — IOHEXOL 350 MG/ML SOLN
80.0000 mL | Freq: Once | INTRAVENOUS | Status: AC | PRN
  Administered 2019-12-12: 80 mL via INTRAVENOUS

## 2019-12-12 MED ORDER — NITROGLYCERIN 0.4 MG SL SUBL
0.8000 mg | SUBLINGUAL_TABLET | Freq: Once | SUBLINGUAL | Status: AC
  Administered 2019-12-12: 0.8 mg via SUBLINGUAL

## 2019-12-12 NOTE — Progress Notes (Signed)
Patient tolerated CT without incident. Did not want anything to eat or drink after. Transported patient by wheelchair to main exit.

## 2019-12-30 DIAGNOSIS — C61 Malignant neoplasm of prostate: Secondary | ICD-10-CM | POA: Diagnosis not present

## 2020-01-05 ENCOUNTER — Encounter: Payer: Self-pay | Admitting: Internal Medicine

## 2020-01-05 ENCOUNTER — Other Ambulatory Visit: Payer: Self-pay

## 2020-01-05 ENCOUNTER — Ambulatory Visit: Payer: Medicare HMO | Admitting: Internal Medicine

## 2020-01-05 VITALS — BP 134/88 | HR 89 | Temp 97.2°F | Ht 70.0 in | Wt 235.0 lb

## 2020-01-05 DIAGNOSIS — I2510 Atherosclerotic heart disease of native coronary artery without angina pectoris: Secondary | ICD-10-CM

## 2020-01-05 DIAGNOSIS — R0602 Shortness of breath: Secondary | ICD-10-CM | POA: Diagnosis not present

## 2020-01-05 DIAGNOSIS — R5383 Other fatigue: Secondary | ICD-10-CM | POA: Diagnosis not present

## 2020-01-05 NOTE — Patient Instructions (Signed)
Medication Instructions:  START aspirin 81mg  daily   *If you need a refill on your cardiac medications before your next appointment, please call your pharmacy*   Lab Work: FASTING lab work in June 2021 (after 3 months on Repatha)  If you have labs (blood work) drawn today and your tests are completely normal, you will receive your results only by: [name] Kitchen MyChart Message (if you have MyChart) OR . A paper copy in the mail If you have any lab test that is abnormal or we need to change your treatment, we will call you to review the results.   Testing/Procedures: Lexiscan Myoview to be completed [address] - [address] Floor   Follow-Up: At [facility], you and your health needs are our priority.  As part of our continuing mission to provide you with exceptional heart care, we have created designated Provider Care Teams.  These Care Teams include your primary Cardiologist (physician) and Advanced Practice Providers (APPs -  Physician Assistants and Nurse Practitioners) who all work together to provide you with the care you need, when you need it.  We recommend signing up for the patient portal called "MyChart".  Sign up information is provided on this After Visit Summary.  MyChart is used to connect with patients for Virtual Visits (Telemedicine).  Patients are able to view lab/test results, encounter notes, upcoming appointments, etc.  Non-urgent messages can be sent to your provider as well.   To learn more about what you can do with MyChart, go to [email or web address].    Your next appointment:   2-3 month(s) - after stress test and labs  The format for your next appointment:   In Person  Provider:   K. Mali Hilty, MD   Other Instructions

## 2020-01-05 NOTE — Progress Notes (Signed)
**Note Kevin-Identified via Obfuscation** LIPID CLINIC CONSULT NOTE  Chief Complaint:  Follow-up CT  Primary Care Physician: Kevin Graff, PA-C  Primary Cardiologist:  No primary care provider on file.  HPI:  Kevin Olson is a 74 y.o. male who is being seen today for the evaluation of dyslipidemia at the request of Kevin Hines, PA-C. This is a 74 year old male with a history of dyslipidemia and recent diagnosis of prostate cancer status post radiation therapy.  He is about a month out from that.  He was referred for persistent dyslipidemia and risk factor modification.  Recent labs in September 2020 showed total cholesterol 342, triglycerides 394, LDL of 216 and HDL 47.  This is on atorvastatin 80 mg daily which she reports being compliant with.  He says that he has a pretty atherogenic diet.  He eats a lot of sausage and gravy biscuits and other high-fat foods which she has been trying to work on.  Of note that he does have a significant family history of heart disease, concerning that both his oldest and youngest brothers had coronary artery bypass grafting x4.  His mother also had heart disease and high cholesterol.  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  Moreover, today he is reported progressive fatigue and dyspnea.  This started actually prior to his radiation therapy for the prostate however of course intermittently worsened somewhat although is improving.  He says that doing outdoor work such as clean up stuff with his chainsaw and other things in the backyard he is noted progressive fatigue, weakness and exercise intolerance.  This is probably been over the past 6 months to a year.  He denies any chest pain but does get short of breath with exertion.  He reports a very remote stress test but has not been unable to exercise due to knee osteoarthritis.  01/05/2020  Returns today for follow-up of his CT coronary angiogram which I personally reviewed on 12/12/2019.  This showed mild to moderate  mixed nonobstructive coronary disease of the proximal LAD however the mid to distal LAD was not well visualized due to probable artifact.  Coronary calcium score was 250, not 56 percentile for age and sex matched control.  I discussed the findings with him today and felt that we could not necessarily exclude ischemia.  He still reports some persistent dyspnea on exertion.  I recommended starting aspirin 81 mg daily and suggested that we should consider functional testing with another modality such as a Myoview.  PMHx:  Past Medical History:  Diagnosis Date  . DM2 (diabetes mellitus, type 2) ([place])   . Dyslipidemia   . Hypertension   . OA (osteoarthritis) of knee   . Prostate cancer [facility])     Past Surgical History:  Procedure Laterality Date  . INGUINAL HERNIA REPAIR    . PROSTATE BIOPSY      FAMHx:  Family History  Problem Relation Age of Onset  . Pancreatic cancer Sister   . Other Sister        brain tumor  . CAD Mother   . CAD Brother        CABGx4   . CAD Brother        CABGx4  . Colon cancer Neg Hx   . Breast cancer Neg Hx     SOCHx:   reports that he quit smoking about 40 years ago. His smoking use included cigarettes. He has a 24.00 pack-year smoking history. He has never used smokeless tobacco. He reports  current alcohol use. He reports that he does not use drugs.  ALLERGIES:  No Known Allergies  ROS: Pertinent items noted in HPI and remainder of comprehensive ROS otherwise negative.  HOME MEDS: Current Outpatient Medications on File Prior to Visit  Medication Sig Dispense Refill  . ALPRAZolam (XANAX) 1 MG tablet alprazolam 1 mg tablet    . atorvastatin (LIPITOR) 80 MG tablet     . baclofen (LIORESAL) 10 MG tablet baclofen 10 mg tablet    . Blood Glucose Monitoring Suppl (TRUE METRIX AIR GLUCOSE METER) DEVI True Metrix Air Glucose Meter kit    . celecoxib (CELEBREX) 200 MG capsule Take 400 mg by mouth daily.     . Coenzyme Q10 (CO Q 10 PO) Take 1 tablet by  mouth daily.    . DULoxetine (CYMBALTA) 60 MG capsule duloxetine 60 mg capsule,delayed release    . Evolocumab (REPATHA SURECLICK) 664 MG/ML SOAJ Inject 1 Dose into the skin every 14 (fourteen) days. 2 pen 11  . glimepiride (AMARYL) 4 MG tablet glimepiride 4 mg tablet    . glucose blood (TRUE METRIX BLOOD GLUCOSE TEST) test strip True Metrix Glucose Test Strip    . levothyroxine (SYNTHROID) 100 MCG tablet     . losartan (COZAAR) 25 MG tablet losartan 25 mg tablet    . metFORMIN (GLUCOPHAGE) 1000 MG tablet metformin 1,000 mg tablet    . metoprolol tartrate (LOPRESSOR) 100 MG tablet Take ONE tablet TWO HOURS prior to test. 1 tablet 0  . Multiple Vitamins-Minerals (CENTRUM MEN PO) Take by mouth.    . pioglitazone (ACTOS) 15 MG tablet pioglitazone 15 mg tablet    . tamsulosin (FLOMAX) 0.4 MG CAPS capsule tamsulosin 0.4 mg capsule    . TRUEplus Lancets 33G MISC TRUEplus Lancets 33 gauge    . TURMERIC PO Take 1 tablet by mouth daily.     No current facility-administered medications on file prior to visit.    LABS/IMAGING: No results found for this or any previous visit (from the past 48 hour(s)). No results found.  LIPID PANEL: No results found for: CHOL, TRIG, HDL, CHOLHDL, VLDL, LDLCALC, LDLDIRECT  WEIGHTS: Wt Readings from Last 3 Encounters:  01/05/20 235 lb (106.6 kg)  11/24/19 244 lb (110.7 kg)  07/22/19 230 lb (104.3 kg)    VITALS: BP 134/88   Pulse 89   Temp (!) 97.2 F (36.2 C)   Ht 5' 10" (1.778 m)   Wt 235 lb (106.6 kg)   SpO2 99%   BMI 33.72 kg/m   EXAM: Deferred  EKG: Deferred  ASSESSMENT: 1. Progressive fatigue/dyspnea-abnormal CT coronary angiogram with 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  FFR could not be performed due to artifact and there was loss of the mid to distal LAD. 2. Marked dyslipidemia, suggestive of familial hyperlipidemia 3. Type 2 diabetes 4. Hypertension 5. Moderate obesity 6. Prostate cancer status post  radiation therapy 7. Family history of premature onset coronary disease  PLAN: 1.   [name] continues to have fatigue and dyspnea.  His Myoview was abnormal suggesting coronary disease of the LAD and I cannot exclude either subtotal occlusion or chronic occlusion of the mid to distal LAD versus possible artifact.  FFR could not be performed.  We discussed further functional testing and I am in favor of a Myoview stress test.  He is agreeable to this.  We will plan to start low-dose aspirin as he does have coronary disease and continue aggressive risk factor  modification.  Plan follow-up with me after his Myoview.  Pixie Casino, MD, [facility], [place] Director of the Advanced Lipid Disorders &  Cardiovascular Risk Reduction Clinic Diplomate of the American Board of Clinical Lipidology Attending Cardiologist  Direct Dial: [phone]  Fax: [phone]  Website:  [email or web address] 01/05/2020, 11:49 AM

## 2020-01-06 ENCOUNTER — Encounter: Payer: Self-pay | Admitting: Internal Medicine

## 2020-01-06 DIAGNOSIS — C61 Malignant neoplasm of prostate: Secondary | ICD-10-CM | POA: Diagnosis not present

## 2020-01-06 DIAGNOSIS — N401 Enlarged prostate with lower urinary tract symptoms: Secondary | ICD-10-CM | POA: Diagnosis not present

## 2020-01-06 DIAGNOSIS — R3912 Poor urinary stream: Secondary | ICD-10-CM | POA: Diagnosis not present

## 2020-01-06 DIAGNOSIS — E349 Endocrine disorder, unspecified: Secondary | ICD-10-CM | POA: Diagnosis not present

## 2020-01-07 ENCOUNTER — Encounter (HOSPITAL_COMMUNITY): Payer: Self-pay | Admitting: *Deleted

## 2020-01-12 ENCOUNTER — Ambulatory Visit (HOSPITAL_COMMUNITY): Payer: Medicare HMO | Attending: Internal Medicine

## 2020-01-12 ENCOUNTER — Other Ambulatory Visit: Payer: Self-pay

## 2020-01-12 DIAGNOSIS — I2510 Atherosclerotic heart disease of native coronary artery without angina pectoris: Secondary | ICD-10-CM

## 2020-01-12 DIAGNOSIS — R0602 Shortness of breath: Secondary | ICD-10-CM

## 2020-01-12 DIAGNOSIS — R5383 Other fatigue: Secondary | ICD-10-CM

## 2020-01-12 LAB — MYOCARDIAL PERFUSION IMAGING
LV dias vol: 86 mL (ref 62–150)
LV sys vol: 32 mL
Peak HR: 85 {beats}/min
Rest HR: 62 {beats}/min
SDS: 1
SRS: 0
SSS: 1
TID: 1.06

## 2020-01-12 MED ORDER — TECHNETIUM TC 99M TETROFOSMIN IV KIT
32.3000 | PACK | Freq: Once | INTRAVENOUS | Status: AC | PRN
  Administered 2020-01-12: 32.3 via INTRAVENOUS
  Filled 2020-01-12: qty 33

## 2020-01-12 MED ORDER — TECHNETIUM TC 99M TETROFOSMIN IV KIT
10.1000 | PACK | Freq: Once | INTRAVENOUS | Status: AC | PRN
  Administered 2020-01-12: 10.1 via INTRAVENOUS
  Filled 2020-01-12: qty 11

## 2020-01-12 MED ORDER — REGADENOSON 0.4 MG/5ML IV SOLN
0.4000 mg | Freq: Once | INTRAVENOUS | Status: AC
  Administered 2020-01-12: 0.4 mg via INTRAVENOUS

## 2020-02-02 DIAGNOSIS — F419 Anxiety disorder, unspecified: Secondary | ICD-10-CM | POA: Diagnosis not present

## 2020-03-03 DIAGNOSIS — E1140 Type 2 diabetes mellitus with diabetic neuropathy, unspecified: Secondary | ICD-10-CM | POA: Diagnosis not present

## 2020-03-03 DIAGNOSIS — Z0000 Encounter for general adult medical examination without abnormal findings: Secondary | ICD-10-CM | POA: Diagnosis not present

## 2020-03-03 DIAGNOSIS — E782 Mixed hyperlipidemia: Secondary | ICD-10-CM | POA: Diagnosis not present

## 2020-03-03 DIAGNOSIS — E039 Hypothyroidism, unspecified: Secondary | ICD-10-CM | POA: Diagnosis not present

## 2020-03-09 ENCOUNTER — Telehealth: Payer: Self-pay | Admitting: Internal Medicine

## 2020-03-09 DIAGNOSIS — E7849 Other hyperlipidemia: Secondary | ICD-10-CM

## 2020-03-09 NOTE — Telephone Encounter (Signed)
Patient calling stating he is supposed to have lab work done this month since he's been on repatha for 3 months, but there are no orders.

## 2020-03-09 NOTE — Telephone Encounter (Signed)
Patient aware lipid panel has been ordered and fasting labs can be completed at any [facility] convenient for him before 03/23/20 visit

## 2020-03-12 DIAGNOSIS — E7849 Other hyperlipidemia: Secondary | ICD-10-CM | POA: Diagnosis not present

## 2020-03-12 LAB — LIPID PANEL
Chol/HDL Ratio: 1.3 ratio (ref 0.0–5.0)
Cholesterol, Total: 68 mg/dL — ABNORMAL LOW (ref 100–199)
HDL: 54 mg/dL (ref >39)
Triglycerides: 60 mg/dL (ref 0–149)

## 2020-03-16 ENCOUNTER — Other Ambulatory Visit: Payer: Self-pay | Admitting: *Deleted

## 2020-03-16 DIAGNOSIS — E7849 Other hyperlipidemia: Secondary | ICD-10-CM

## 2020-03-16 MED ORDER — ATORVASTATIN CALCIUM 80 MG PO TABS: 40.0000 mg | ORAL_TABLET | Freq: Every day | ORAL | 3 refills | Status: DC

## 2020-03-23 ENCOUNTER — Telehealth (INDEPENDENT_AMBULATORY_CARE_PROVIDER_SITE_OTHER): Payer: Medicare HMO | Admitting: Internal Medicine

## 2020-03-23 ENCOUNTER — Encounter: Payer: Self-pay | Admitting: Internal Medicine

## 2020-03-23 VITALS — Wt 210.9 lb

## 2020-03-23 DIAGNOSIS — I2510 Atherosclerotic heart disease of native coronary artery without angina pectoris: Secondary | ICD-10-CM

## 2020-03-23 DIAGNOSIS — R350 Frequency of micturition: Secondary | ICD-10-CM

## 2020-03-23 DIAGNOSIS — E7849 Other hyperlipidemia: Secondary | ICD-10-CM

## 2020-03-23 NOTE — Progress Notes (Signed)
Virtual Visit via Telephone Note   This visit type was conducted due to national recommendations for restrictions regarding the COVID-19 Pandemic (e.g. social distancing) in an effort to limit this patient's exposure and mitigate transmission in our community.  Due to his co-morbid illnesses, this patient is at least at moderate risk for complications without adequate follow up.  This format is felt to be most appropriate for this patient at this time.  The patient did not have access to video technology/had technical difficulties with video requiring transitioning to audio format only (telephone).  All issues noted in this document were discussed and addressed.  No physical exam could be performed with this format.  Please refer to the patient's chart for his  consent to telehealth for [facility].   Evaluation Performed:  Telephone follow-up  Date:  03/23/2020   ID:  Kevin Olson, DOB 04/28/1946, MRN [id number]  Patient Location:  [address] [place] [address]  Provider location:   [address], [address] [place], [place] [address]  PCP:  Aurea Graff, PA-C (Inactive)  Cardiologist:  No primary care provider on file. Electrophysiologist:  None   Chief Complaint:  Urinating a lot  History of Present Illness:    Kevin Olson is a 74 y.o. male who presents via audio/video conferencing for a telehealth visit today. This is a 74 year old male with a history of dyslipidemia and recent diagnosis of prostate cancer status post radiation therapy.  He is about a month out from that.  He was referred for persistent dyslipidemia and risk factor modification.  Recent labs in September 2020 showed total cholesterol 342, triglycerides 394, LDL of 216 and HDL 47.  This is on atorvastatin 80 mg daily which she reports being compliant with.  He says that he has a pretty atherogenic diet.  He eats a lot of sausage and gravy biscuits and other high-fat foods which she has been  trying to work on.  Of note that he does have a significant family history of heart disease, concerning that both his oldest and youngest brothers had coronary artery bypass grafting x4.  His mother also had heart disease and high cholesterol.  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  Moreover, today he is reported progressive fatigue and dyspnea.  This started actually prior to his radiation therapy for the prostate however of course intermittently worsened somewhat although is improving.  He says that doing outdoor work such as clean up stuff with his chainsaw and other things in the backyard he is noted progressive fatigue, weakness and exercise intolerance.  This is probably been over the past 6 months to a year.  He denies any chest pain but does get short of breath with exertion.  He reports a very remote stress test but has not been unable to exercise due to knee osteoarthritis.  01/05/2020  Returns today for follow-up of his CT coronary angiogram which I personally reviewed on 12/12/2019.  This showed mild to moderate mixed nonobstructive coronary disease of the proximal LAD however the mid to distal LAD was not well visualized due to probable artifact.  Coronary calcium score was 250, not 56 percentile for age and sex matched control.  I discussed the findings with him today and felt that we could not necessarily exclude ischemia.  He still reports some persistent dyspnea on exertion.  I recommended starting aspirin 81 mg daily and suggested that we should consider functional testing with another modality such as  a Myoview.   03/23/2020  Mr. [name] was seen today via telephone follow-up.  He recently started on Repatha.  He is tolerating this well and has had marked reduction in his lipids.  Back in April 2021 based on an abnormal CT coronary angiogram he underwent nuclear stress testing.  This was negative for ischemia and showed normal LV function.  A repeat lipid showed  total cholesterol 68, triglycerides 60, HDL 54 and the LDL was a negative value which could not be calculated.  Based on this finding I recommended decreasing his atorvastatin from 80 to 40 mg daily and to continue on Repatha.  Only symptoms are frequent urination.  Does have a history of prostate cancer and prior radiation therapy followed by Dr. Junious Silk.  He says he is having to urinate every few hours and does feel fatigued but is up most of the night.  I encouraged him to reach out to his urologist.  The patient does not have symptoms concerning for COVID-19 infection (fever, chills, cough, or new SHORTNESS OF BREATH).    Prior CV studies:   The following studies were reviewed today:  Review, labs  PMHx:  Past Medical History:  Diagnosis Date   DM2 (diabetes mellitus, type 2) ([place])    Dyslipidemia    Hypertension    OA (osteoarthritis) of knee    Prostate cancer ([place])     Past Surgical History:  Procedure Laterality Date   INGUINAL HERNIA REPAIR     PROSTATE BIOPSY      FAMHx:  Family History  Problem Relation Age of Onset   Pancreatic cancer Sister    Other Sister        brain tumor   CAD Mother    CAD Brother        CABGx4    CAD Brother        CABGx4   Colon cancer Neg Hx    Breast cancer Neg Hx     SOCHx:   reports that he quit smoking about 40 years ago. His smoking use included cigarettes. He has a 24.00 pack-year smoking history. He has never used smokeless tobacco. He reports current alcohol use. He reports that he does not use drugs.  ALLERGIES:  No Known Allergies  MEDS:  Current Meds  Medication Sig   ALPRAZolam (XANAX) 1 MG tablet alprazolam 1 mg tablet   aspirin EC 81 MG tablet Take 81 mg by mouth daily.   atorvastatin (LIPITOR) 80 MG tablet Take 0.5 tablets (40 mg total) by mouth daily.   baclofen (LIORESAL) 10 MG tablet baclofen 10 mg tablet   Blood Glucose Monitoring Suppl (TRUE METRIX AIR GLUCOSE METER) DEVI True Metrix  Air Glucose Meter kit   celecoxib (CELEBREX) 200 MG capsule Take 400 mg by mouth daily.    Coenzyme Q10 (CO Q 10 PO) Take 1 tablet by mouth daily.   DULoxetine (CYMBALTA) 60 MG capsule duloxetine 60 mg capsule,delayed release   Evolocumab (REPATHA SURECLICK) 401 MG/ML SOAJ Inject 1 Dose into the skin every 14 (fourteen) days.   glimepiride (AMARYL) 4 MG tablet glimepiride 4 mg tablet   glucose blood (TRUE METRIX BLOOD GLUCOSE TEST) test strip True Metrix Glucose Test Strip   levothyroxine (SYNTHROID) 100 MCG tablet    losartan (COZAAR) 25 MG tablet losartan 25 mg tablet   metFORMIN (GLUCOPHAGE) 1000 MG tablet metformin 1,000 mg tablet   metoprolol tartrate (LOPRESSOR) 100 MG tablet Take ONE tablet TWO HOURS prior to test.   Multiple  Vitamins-Minerals (CENTRUM MEN PO) Take by mouth.   pioglitazone (ACTOS) 15 MG tablet pioglitazone 15 mg tablet   tamsulosin (FLOMAX) 0.4 MG CAPS capsule tamsulosin 0.4 mg capsule   TRUEplus Lancets 33G MISC TRUEplus Lancets 33 gauge   TURMERIC PO Take 1 tablet by mouth daily.     ROS: Pertinent items noted in HPI and remainder of comprehensive ROS otherwise negative.  Labs/Other Tests and Data Reviewed:    Recent Labs: 12/11/2019: BUN 17; Creatinine, Ser 0.69; Potassium 4.8; Sodium 143   Recent Lipid Panel Lab Results  Component Value Date/Time   CHOL 68 (L) 03/12/2020 10:43 AM   TRIG 60 03/12/2020 10:43 AM   HDL 54 03/12/2020 10:43 AM   CHOLHDL 1.3 03/12/2020 10:43 AM   LDLCALC Comment (A) 03/12/2020 10:43 AM    Wt Readings from Last 3 Encounters:  03/23/20 210 lb 14.4 oz (95.7 kg)  01/12/20 235 lb (106.6 kg)  01/05/20 235 lb (106.6 kg)     Exam:    Vital Signs:  Wt 210 lb 14.4 oz (95.7 kg)    BMI 30.26 kg/m    Not performed due to telephone visit  ASSESSMENT & PLAN:    1. Progressive fatigue/dyspnea-abnormal CT coronary angiogram with 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  FFR could not be  performed due to artifact and there was loss of the mid to distal LAD. 2. Marked dyslipidemia, suggestive of familial hyperlipidemia 3. Type 2 diabetes 4. Hypertension 5. Moderate obesity 6. Prostate cancer status post radiation therapy 7. Family history of premature onset coronary disease  Mr. Koepp has had an excellent response to Repatha in addition to atorvastatin.  Based on the negative calculated LDL, I advised him to decrease the dose to 40 mg daily.  He will continue on Repatha.  We will plan repeat lipids in about 3 months and follow-up with me in 6 months.  He continues to deny any anginal symptoms and will follow up with his urologist for his polyuria.  COVID-19 Education: The signs and symptoms of COVID-19 were discussed with the patient and how to seek care for testing (follow up with PCP or arrange E-visit).  The importance of social distancing was discussed today.  Patient Risk:   After full review of this patients clinical status, I feel that they are at least moderate risk at this time.  Time:   Today, I have spent 15 minutes with the patient with telehealth technology discussing dyslipidemia, coronary artery disease, frequent urination.     Medication Adjustments/Labs and Tests Ordered: Current medicines are reviewed at length with the patient today.  Concerns regarding medicines are outlined above.   Tests Ordered: No orders of the defined types were placed in this encounter.   Medication Changes: No orders of the defined types were placed in this encounter.   Disposition:  in 6 month(s)  Pixie Casino, MD, [facility], [place] Director of the Advanced Lipid Disorders &  Cardiovascular Risk Reduction Clinic Diplomate of the American Board of Clinical Lipidology Attending Cardiologist  Direct Dial: [phone]   Fax: [phone]  Website:  [email or web address]  Pixie Casino, MD  03/23/2020 8:36 AM

## 2020-03-23 NOTE — Patient Instructions (Signed)
Medication Instructions:  Your physician recommends that you continue on your current medications as directed. Please refer to the Current Medication list given to you today.  *If you need a refill on your cardiac medications before your next appointment, please call your pharmacy*   Lab Work: FASTING lipid panel in 3 months to check cholesterol   If you have labs (blood work) drawn today and your tests are completely normal, you will receive your results only by: [name] Kitchen MyChart Message (if you have MyChart) OR . A paper copy in the mail If you have any lab test that is abnormal or we need to change your treatment, we will call you to review the results.   Testing/Procedures: NONE   Follow-Up: At [facility], you and your health needs are our priority.  As part of our continuing mission to provide you with exceptional heart care, we have created designated Provider Care Teams.  These Care Teams include your primary Cardiologist (physician) and Advanced Practice Providers (APPs -  Physician Assistants and Nurse Practitioners) who all work together to provide you with the care you need, when you need it.  We recommend signing up for the patient portal called "MyChart".  Sign up information is provided on this After Visit Summary.  MyChart is used to connect with patients for Virtual Visits (Telemedicine).  Patients are able to view lab/test results, encounter notes, upcoming appointments, etc.  Non-urgent messages can be sent to your provider as well.   To learn more about what you can do with MyChart, go to [email or web address].    Your next appointment:   6 month(s) - lipid clinic  The format for your next appointment:   Either In Person or Virtual  Provider:   K. Mali Hilty, MD   Other Instructions

## 2020-03-31 DIAGNOSIS — C61 Malignant neoplasm of prostate: Secondary | ICD-10-CM | POA: Diagnosis not present

## 2020-03-31 DIAGNOSIS — N3000 Acute cystitis without hematuria: Secondary | ICD-10-CM | POA: Diagnosis not present

## 2020-03-31 DIAGNOSIS — N3281 Overactive bladder: Secondary | ICD-10-CM | POA: Diagnosis not present

## 2020-04-06 DIAGNOSIS — C61 Malignant neoplasm of prostate: Secondary | ICD-10-CM | POA: Diagnosis not present

## 2020-04-06 DIAGNOSIS — N401 Enlarged prostate with lower urinary tract symptoms: Secondary | ICD-10-CM | POA: Diagnosis not present

## 2020-04-06 DIAGNOSIS — R3915 Urgency of urination: Secondary | ICD-10-CM | POA: Diagnosis not present

## 2020-04-06 DIAGNOSIS — R350 Frequency of micturition: Secondary | ICD-10-CM | POA: Diagnosis not present

## 2020-05-18 DIAGNOSIS — R3915 Urgency of urination: Secondary | ICD-10-CM | POA: Diagnosis not present

## 2020-05-18 DIAGNOSIS — R350 Frequency of micturition: Secondary | ICD-10-CM | POA: Diagnosis not present

## 2020-05-18 DIAGNOSIS — C61 Malignant neoplasm of prostate: Secondary | ICD-10-CM | POA: Diagnosis not present

## 2020-05-19 DIAGNOSIS — M25561 Pain in right knee: Secondary | ICD-10-CM | POA: Diagnosis not present

## 2020-05-19 DIAGNOSIS — M25562 Pain in left knee: Secondary | ICD-10-CM | POA: Diagnosis not present

## 2020-05-19 DIAGNOSIS — M1712 Unilateral primary osteoarthritis, left knee: Secondary | ICD-10-CM | POA: Diagnosis not present

## 2020-05-19 DIAGNOSIS — M170 Bilateral primary osteoarthritis of knee: Secondary | ICD-10-CM | POA: Diagnosis not present

## 2020-05-19 DIAGNOSIS — M1711 Unilateral primary osteoarthritis, right knee: Secondary | ICD-10-CM | POA: Diagnosis not present

## 2020-05-25 DIAGNOSIS — Z23 Encounter for immunization: Secondary | ICD-10-CM | POA: Diagnosis not present

## 2020-06-16 DIAGNOSIS — M170 Bilateral primary osteoarthritis of knee: Secondary | ICD-10-CM | POA: Diagnosis not present

## 2020-06-17 DIAGNOSIS — Z794 Long term (current) use of insulin: Secondary | ICD-10-CM | POA: Diagnosis not present

## 2020-06-17 DIAGNOSIS — E1140 Type 2 diabetes mellitus with diabetic neuropathy, unspecified: Secondary | ICD-10-CM | POA: Diagnosis not present

## 2020-08-02 ENCOUNTER — Other Ambulatory Visit: Payer: Self-pay

## 2020-08-02 DIAGNOSIS — C61 Malignant neoplasm of prostate: Secondary | ICD-10-CM | POA: Diagnosis not present

## 2020-08-02 DIAGNOSIS — E7849 Other hyperlipidemia: Secondary | ICD-10-CM

## 2020-08-02 LAB — LIPID PANEL
Chol/HDL Ratio: 1.4 ratio (ref 0.0–5.0)
Cholesterol, Total: 79 mg/dL — ABNORMAL LOW (ref 100–199)
HDL: 58 mg/dL (ref >39)
LDL Chol Calc (NIH): 7 mg/dL (ref 0–99)
Triglycerides: 60 mg/dL (ref 0–149)
VLDL Cholesterol Cal: 14 mg/dL (ref 5–40)

## 2020-08-06 ENCOUNTER — Other Ambulatory Visit: Payer: Self-pay | Admitting: *Deleted

## 2020-08-06 DIAGNOSIS — E7849 Other hyperlipidemia: Secondary | ICD-10-CM

## 2020-08-09 DIAGNOSIS — C61 Malignant neoplasm of prostate: Secondary | ICD-10-CM | POA: Diagnosis not present

## 2020-08-09 DIAGNOSIS — R350 Frequency of micturition: Secondary | ICD-10-CM | POA: Diagnosis not present

## 2020-08-09 DIAGNOSIS — N401 Enlarged prostate with lower urinary tract symptoms: Secondary | ICD-10-CM | POA: Diagnosis not present

## 2020-09-02 DIAGNOSIS — E782 Mixed hyperlipidemia: Secondary | ICD-10-CM | POA: Diagnosis not present

## 2020-09-02 DIAGNOSIS — E039 Hypothyroidism, unspecified: Secondary | ICD-10-CM | POA: Diagnosis not present

## 2020-09-02 DIAGNOSIS — Z23 Encounter for immunization: Secondary | ICD-10-CM | POA: Diagnosis not present

## 2020-09-02 DIAGNOSIS — Z7984 Long term (current) use of oral hypoglycemic drugs: Secondary | ICD-10-CM | POA: Diagnosis not present

## 2020-09-02 DIAGNOSIS — E1140 Type 2 diabetes mellitus with diabetic neuropathy, unspecified: Secondary | ICD-10-CM | POA: Diagnosis not present

## 2020-09-08 DIAGNOSIS — H25812 Combined forms of age-related cataract, left eye: Secondary | ICD-10-CM | POA: Diagnosis not present

## 2020-09-08 DIAGNOSIS — Z48810 Encounter for surgical aftercare following surgery on the sense organs: Secondary | ICD-10-CM | POA: Diagnosis not present

## 2020-09-08 DIAGNOSIS — E119 Type 2 diabetes mellitus without complications: Secondary | ICD-10-CM | POA: Diagnosis not present

## 2020-09-08 DIAGNOSIS — H524 Presbyopia: Secondary | ICD-10-CM | POA: Diagnosis not present

## 2020-09-08 DIAGNOSIS — Z961 Presence of intraocular lens: Secondary | ICD-10-CM | POA: Diagnosis not present

## 2020-09-08 DIAGNOSIS — H25813 Combined forms of age-related cataract, bilateral: Secondary | ICD-10-CM | POA: Diagnosis not present

## 2020-09-08 DIAGNOSIS — H5213 Myopia, bilateral: Secondary | ICD-10-CM | POA: Diagnosis not present

## 2020-09-08 DIAGNOSIS — H52203 Unspecified astigmatism, bilateral: Secondary | ICD-10-CM | POA: Diagnosis not present

## 2020-09-08 DIAGNOSIS — Z9841 Cataract extraction status, right eye: Secondary | ICD-10-CM | POA: Diagnosis not present

## 2020-09-29 DIAGNOSIS — H25812 Combined forms of age-related cataract, left eye: Secondary | ICD-10-CM | POA: Diagnosis not present

## 2020-09-29 DIAGNOSIS — H2511 Age-related nuclear cataract, right eye: Secondary | ICD-10-CM | POA: Diagnosis not present

## 2020-09-29 DIAGNOSIS — H25011 Cortical age-related cataract, right eye: Secondary | ICD-10-CM | POA: Diagnosis not present

## 2020-10-01 ENCOUNTER — Other Ambulatory Visit: Payer: Self-pay | Admitting: Internal Medicine

## 2020-10-06 DIAGNOSIS — H2512 Age-related nuclear cataract, left eye: Secondary | ICD-10-CM | POA: Diagnosis not present

## 2020-10-06 DIAGNOSIS — H25012 Cortical age-related cataract, left eye: Secondary | ICD-10-CM | POA: Diagnosis not present

## 2020-10-09 NOTE — H&P (Deleted)
TOTAL KNEE ADMISSION H&P  Patient is being admitted for right total knee arthroplasty.  Subjective:  Chief Complaint:   Right knee OA / pain  HPI: Kevin Olson, 75 y.o. male, has a history of pain and functional disability in the right knee due to arthritis and has failed non-surgical conservative treatments for greater than 12 weeks to include NSAID's and/or analgesics, corticosteriod injections and activity modification.  Onset of symptoms was gradual, starting 2+ years ago with gradually worsening course since that time. The patient noted no past surgery on the right knee(s).  Patient currently rates pain in the right knee(s) at 8 out of 10 with activity. Patient has night pain, worsening of pain with activity and weight bearing, pain that interferes with activities of daily living, pain with passive range of motion, crepitus and joint swelling.  Patient has evidence of periarticular osteophytes and joint space narrowing by imaging studies.  There is no active infection.  Risks, benefits and expectations were discussed with the patient.  Risks including but not limited to the risk of anesthesia, blood clots, nerve damage, blood vessel damage, failure of the prosthesis, infection and up to and including death.  Patient understand the risks, benefits and expectations and wishes to proceed with surgery.   D/C Plans:       Home (obs)  Post-op Meds:       No Rx given   Tranexamic Acid:      To be given - IV   Decadron:      Is to be given  FYI:      ASA  [place]  DME:   Pt already has equipment  PT:   [place]: [place]   Patient Active Problem List   Diagnosis Date Noted  . Malignant neoplasm of prostate ([place]) 07/22/2019  . Osteoarthritis of left knee 10/02/2018  . Osteoarthritis of right knee 10/02/2018  . Pain in left knee 09/03/2018  . Pain in right knee 09/03/2018  . Lumbar pain 12/17/2017  . Spinal stenosis of lumbar region 12/17/2017   Past  Medical History:  Diagnosis Date  . DM2 (diabetes mellitus, type 2) ([place])   . Dyslipidemia   . Hypertension   . OA (osteoarthritis) of knee   . Prostate cancer [facility])     Past Surgical History:  Procedure Laterality Date  . INGUINAL HERNIA REPAIR    . PROSTATE BIOPSY      No current facility-administered medications for this encounter.   Current Outpatient Medications  Medication Sig Dispense Refill Last Dose  . acetaminophen (TYLENOL) 500 MG tablet Take 500-1,000 mg by mouth 2 (two) times daily as needed (pain.).     [name] Kitchen ALPRAZolam (XANAX) 1 MG tablet Take 1 mg by mouth at bedtime.     [name] Kitchen aspirin EC 81 MG tablet Take 81 mg by mouth daily.     [name] Kitchen atorvastatin (LIPITOR) 10 MG tablet Take 20 mg by mouth every evening.     . baclofen (LIORESAL) 10 MG tablet Take 10 mg by mouth 3 (three) times daily.     . celecoxib (CELEBREX) 200 MG capsule Take 200 mg by mouth at bedtime.     . Cholecalciferol (VITAMIN D3) 50 MCG (2000 UT) TABS Take 2,000 Units by mouth every evening.     . Coenzyme Q10-Vitamin E (QUNOL ULTRA COQ10 PO) Take 1 capsule by mouth at bedtime.     . DULoxetine (CYMBALTA) 60 MG capsule Take 60 mg by mouth every  evening.     [name] Kitchen glimepiride (AMARYL) 4 MG tablet Take 4 mg by mouth daily with breakfast.     . ibuprofen (ADVIL) 200 MG tablet Take 200-400 mg by mouth 2 (two) times daily as needed (pain.).     [name] Kitchen ketorolac (ACULAR) 0.5 % ophthalmic solution Place 1 drop into both eyes in the morning, at noon, and at bedtime.     [name] Kitchen levothyroxine (SYNTHROID) 100 MCG tablet Take 100 mcg by mouth.     . losartan (COZAAR) 25 MG tablet Take 12.5 mg by mouth every evening.     . metFORMIN (GLUCOPHAGE) 1000 MG tablet Take 1,000 mg by mouth in the morning and at bedtime.     . Misc Natural Products (ADV TURMERIC CURCUMIN COMPLEX PO) Take 1 capsule by mouth every evening. Qunol Turmeric Curcumin Complex     . Multiple Vitamin (MULTIVITAMIN WITH MINERALS) TABS tablet Take 1 tablet by mouth daily.  Centrum Silver     . Multiple Vitamins-Minerals (OCUVITE EYE HEATLH GUMMIES PO) Take 1 tablet by mouth at bedtime.     [name] Kitchen ofloxacin (OCUFLOX) 0.3 % ophthalmic solution Place 1 drop into both eyes in the morning, at noon, and at bedtime.     . pioglitazone (ACTOS) 15 MG tablet Take 15 mg by mouth daily.     . prednisoLONE acetate (PRED FORTE) 1 % ophthalmic suspension Place 1 drop into both eyes in the morning, at noon, and at bedtime.     [name] Kitchen REPATHA SURECLICK 102 MG/ML SOAJ INJECT 1 DOSE INTO THE SKIN EVERY 14 (FOURTEEN) DAYS. (Patient taking differently: Inject 140 mg into the skin every 14 (fourteen) days.) 2 mL 6   . sitaGLIPtin (JANUVIA) 50 MG tablet Take 50 mg by mouth every evening.     . tamsulosin (FLOMAX) 0.4 MG CAPS capsule Take 0.8 mg by mouth daily.     . Blood Glucose Monitoring Suppl (TRUE METRIX AIR GLUCOSE METER) DEVI True Metrix Air Glucose Meter kit     . glucose blood (TRUE METRIX BLOOD GLUCOSE TEST) test strip True Metrix Glucose Test Strip     . TRUEplus Lancets 33G MISC TRUEplus Lancets 33 gauge      No Known Allergies   Social History   Tobacco Use  . Smoking status: Former Smoker    Packs/day: 2.00    Years: 12.00    Pack years: 24.00    Types: Cigarettes    Quit date: 04/19/1979    Years since quitting: 41.5  . Smokeless tobacco: Never Used  Substance Use Topics  . Alcohol use: Yes    Family History  Problem Relation Age of Onset  . Pancreatic cancer Sister   . Other Sister        brain tumor  . CAD Mother   . CAD Brother        CABGx4   . CAD Brother        CABGx4  . Colon cancer Neg Hx   . Breast cancer Neg Hx      Review of Systems  Constitutional: Negative.   HENT: Negative.   Eyes: Negative.   Respiratory: Negative.   Cardiovascular: Negative.   Gastrointestinal: Negative.   Genitourinary: Negative.   Musculoskeletal: Positive for joint pain.  Skin: Negative.   Neurological: Negative.   Endo/Heme/Allergies: Negative.    Psychiatric/Behavioral: The patient is nervous/anxious.      Objective:  Physical Exam Constitutional:      Appearance: He is well-developed.  HENT:  Head: Normocephalic.  Eyes:     Pupils: Pupils are equal, round, and reactive to light.  Neck:     Thyroid: No thyromegaly.     Vascular: No JVD.     Trachea: No tracheal deviation.  Cardiovascular:     Rate and Rhythm: Normal rate and regular rhythm.     Pulses: Intact distal pulses.  Pulmonary:     Effort: Pulmonary effort is normal. No respiratory distress.     Breath sounds: Normal breath sounds. No wheezing.  Abdominal:     Palpations: Abdomen is soft.     Tenderness: There is no abdominal tenderness. There is no guarding.  Musculoskeletal:     Cervical back: Neck supple.  Lymphadenopathy:     Cervical: No cervical adenopathy.  Skin:    General: Skin is warm and dry.  Neurological:     Mental Status: He is alert and oriented to person, place, and time.  Psychiatric:        Mood and Affect: Mood and affect normal.       Labs:  Estimated body mass index is 30.26 kg/m as calculated from the following:   Height as of 01/12/20: 5' 10"  (1.778 m).   Weight as of 03/23/20: 95.7 kg.   Imaging Review Plain radiographs demonstrate severe degenerative joint disease of the right knee(s). The bone quality appears to be good for age and reported activity level.      Assessment/Plan:  End stage arthritis, right knee   The patient history, physical examination, clinical judgment of the provider and imaging studies are consistent with end stage degenerative joint disease of the right knee(s) and total knee arthroplasty is deemed medically necessary. The treatment options including medical management, injection therapy arthroscopy and arthroplasty were discussed at length. The risks and benefits of total knee arthroplasty were presented and reviewed. The risks due to aseptic loosening, infection, stiffness, patella  tracking problems, thromboembolic complications and other imponderables were discussed. The patient acknowledged the explanation, agreed to proceed with the plan and consent was signed. Patient is being admitted for treatment for surgery, pain control, PT, OT, prophylactic antibiotics, VTE prophylaxis, progressive ambulation and ADL's and discharge planning. The patient is planning to be discharged home.    Patient's anticipated LOS is less than 2 midnights, meeting these requirements: - Lives within 1 hour of care - Has a competent adult at home to recover with post-op recover - NO history of  - Chronic pain requiring opiods  - Coronary Artery Disease  - Heart failure  - Heart attack  - Stroke  - DVT/VTE  - Cardiac arrhythmia  - Respiratory Failure/COPD  - Renal failure  - Anemia  - Advanced Liver disease

## 2020-10-13 ENCOUNTER — Other Ambulatory Visit: Payer: Self-pay | Admitting: Internal Medicine

## 2020-10-13 MED ORDER — REPATHA SURECLICK 140 MG/ML ~~LOC~~ SOAJ: 1.0000 | SUBCUTANEOUS | 6 refills | Status: DC

## 2020-10-13 NOTE — Telephone Encounter (Signed)
repatha refilled to [facility]

## 2020-10-14 ENCOUNTER — Other Ambulatory Visit: Payer: Self-pay

## 2020-10-14 ENCOUNTER — Encounter: Payer: Self-pay | Admitting: Internal Medicine

## 2020-10-14 ENCOUNTER — Ambulatory Visit: Payer: Medicare HMO | Admitting: Internal Medicine

## 2020-10-14 VITALS — BP 129/69 | HR 69 | Ht 70.0 in | Wt 221.0 lb

## 2020-10-14 DIAGNOSIS — E7849 Other hyperlipidemia: Secondary | ICD-10-CM | POA: Diagnosis not present

## 2020-10-14 DIAGNOSIS — I2510 Atherosclerotic heart disease of native coronary artery without angina pectoris: Secondary | ICD-10-CM | POA: Diagnosis not present

## 2020-10-14 NOTE — Patient Instructions (Signed)
Medication Instructions:  Your physician recommends that you continue on your current medications as directed. Please refer to the Current Medication list given to you today.   If you need assistance with Repatha, you can apply for a co-pay grant from [email or web address] >> disease funds >> hypercholesterolemia   *If you need a refill on your cardiac medications before your next appointment, please call your pharmacy*   Lab Work: FASTING lab work in 1 year, before your next visit  If you have labs (blood work) drawn today and your tests are completely normal, you will receive your results only by: [name] Kitchen MyChart Message (if you have MyChart) OR . A paper copy in the mail If you have any lab test that is abnormal or we need to change your treatment, we will call you to review the results.   Testing/Procedures: NONE   Follow-Up: At [facility], you and your health needs are our priority.  As part of our continuing mission to provide you with exceptional heart care, we have created designated Provider Care Teams.  These Care Teams include your primary Cardiologist (physician) and Advanced Practice Providers (APPs -  Physician Assistants and Nurse Practitioners) who all work together to provide you with the care you need, when you need it.  We recommend signing up for the patient portal called "MyChart".  Sign up information is provided on this After Visit Summary.  MyChart is used to connect with patients for Virtual Visits (Telemedicine).  Patients are able to view lab/test results, encounter notes, upcoming appointments, etc.  Non-urgent messages can be sent to your provider as well.   To learn more about what you can do with MyChart, go to [email or web address].    Your next appointment:   12 month(s) - lipid clinic  The format for your next appointment:   In Person  Provider:   K. Mali Hilty, MD   Other Instructions

## 2020-10-14 NOTE — Progress Notes (Signed)
LIPID CLINIC CONSULT NOTE  Chief Complaint:  Follow-up  Primary Care Physician: Aurea Graff, PA-C (Inactive)  Primary Cardiologist:  No primary care provider on file.  HPI:  Kevin Olson is a 75 y.o. male who is being seen today for the evaluation of dyslipidemia at the request of Sammuel Hines, PA-C. This is a 75 year old male with a history of dyslipidemia and recent diagnosis of prostate cancer status post radiation therapy.  He is about a month out from that.  He was referred for persistent dyslipidemia and risk factor modification.  Recent labs in September 2020 showed total cholesterol 342, triglycerides 394, LDL of 216 and HDL 47.  This is on atorvastatin 80 mg daily which she reports being compliant with.  He says that he has a pretty atherogenic diet.  He eats a lot of sausage and gravy biscuits and other high-fat foods which she has been trying to work on.  Of note that he does have a significant family history of heart disease, concerning that both his oldest and youngest brothers had coronary artery bypass grafting x4.  His mother also had heart disease and high cholesterol.  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  Moreover, today he is reported progressive fatigue and dyspnea.  This started actually prior to his radiation therapy for the prostate however of course intermittently worsened somewhat although is improving.  He says that doing outdoor work such as clean up stuff with his chainsaw and other things in the backyard he is noted progressive fatigue, weakness and exercise intolerance.  This is probably been over the past 6 months to a year.  He denies any chest pain but does get short of breath with exertion.  He reports a very remote stress test but has not been unable to exercise due to knee osteoarthritis.  01/05/2020  Returns today for follow-up of his CT coronary angiogram which I personally reviewed on 12/12/2019.  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  Coronary calcium score was 250, not 56 percentile for age and sex matched control.  I discussed the findings with him today and felt that we could not necessarily exclude ischemia.  He still reports some persistent dyspnea on exertion.  I recommended starting aspirin 81 mg daily and suggested that we should consider functional testing with another modality such as a Myoview.  1/27/20202 Kevin Olson is seen today in follow-up for dyslipidemia.  He he had a virtual visit over the summer.  He is cholesterol on Repatha had resulted in negative value.  I then decreased his atorvastatin from 80 to 40 mg then down to 20 and now 10 mg daily.  He had repeat labs in December 2021 demonstrating a total cholesterol of 92, triglycerides 109, HDL 57 and LDL of 15.  He seems to be tolerating the combination of medications.  He reports that the Repatha is affordable through [facility] at $45 a month.  PMHx:  Past Medical History:  Diagnosis Date  . DM2 (diabetes mellitus, type 2) ([place])   . Dyslipidemia   . Hypertension   . OA (osteoarthritis) of knee   . Prostate cancer [facility])     Past Surgical History:  Procedure Laterality Date  . INGUINAL HERNIA REPAIR    . PROSTATE BIOPSY      FAMHx:  Family History  Problem Relation Age of Onset  .  Pancreatic cancer Sister   . Other Sister        brain tumor  . CAD Mother   . CAD Brother        CABGx4   . CAD Brother        CABGx4  . Colon cancer Neg Hx   . Breast cancer Neg Hx     SOCHx:   reports that he quit smoking about 41 years ago. His smoking use included cigarettes. He has a 24.00 pack-year smoking history. He has never used smokeless tobacco. He reports current alcohol use. He reports that he does not use drugs.  ALLERGIES:  No Known Allergies  ROS: Pertinent items noted in HPI and remainder of comprehensive ROS  otherwise negative.  HOME MEDS: Current Outpatient Medications on File Prior to Visit  Medication Sig Dispense Refill  . acetaminophen (TYLENOL) 500 MG tablet Take 500-1,000 mg by mouth 2 (two) times daily as needed (pain.).    [name] Kitchen ALPRAZolam (XANAX) 1 MG tablet Take 1 mg by mouth at bedtime.    [name] Kitchen aspirin EC 81 MG tablet Take 81 mg by mouth daily.    [name] Kitchen atorvastatin (LIPITOR) 10 MG tablet Take 20 mg by mouth every evening.    . baclofen (LIORESAL) 10 MG tablet Take 10 mg by mouth 3 (three) times daily.    . Blood Glucose Monitoring Suppl (TRUE METRIX AIR GLUCOSE METER) DEVI True Metrix Air Glucose Meter kit    . celecoxib (CELEBREX) 200 MG capsule Take 200 mg by mouth at bedtime.    . Cholecalciferol (VITAMIN D3) 50 MCG (2000 UT) TABS Take 2,000 Units by mouth every evening.    . Coenzyme Q10-Vitamin E (QUNOL ULTRA COQ10 PO) Take 1 capsule by mouth at bedtime.    . DULoxetine (CYMBALTA) 60 MG capsule Take 60 mg by mouth every evening.    . Evolocumab (REPATHA SURECLICK) 329 MG/ML SOAJ Inject 1 Dose into the skin every 14 (fourteen) days. 2 mL 6  . glimepiride (AMARYL) 4 MG tablet Take 4 mg by mouth daily with breakfast.    . glucose blood (TRUE METRIX BLOOD GLUCOSE TEST) test strip True Metrix Glucose Test Strip    . ibuprofen (ADVIL) 200 MG tablet Take 200-400 mg by mouth 2 (two) times daily as needed (pain.).    [name] Kitchen ketorolac (ACULAR) 0.5 % ophthalmic solution Place 1 drop into both eyes in the morning, at noon, and at bedtime.    [name] Kitchen levothyroxine (SYNTHROID) 100 MCG tablet Take 100 mcg by mouth.    . losartan (COZAAR) 25 MG tablet Take 12.5 mg by mouth every evening.    . metFORMIN (GLUCOPHAGE) 1000 MG tablet Take 1,000 mg by mouth in the morning and at bedtime.    . Misc Natural Products (ADV TURMERIC CURCUMIN COMPLEX PO) Take 1 capsule by mouth every evening. Qunol Turmeric Curcumin Complex    . Multiple Vitamin (MULTIVITAMIN WITH MINERALS) TABS tablet Take 1 tablet by mouth daily. Centrum  Silver    . Multiple Vitamins-Minerals (OCUVITE EYE HEATLH GUMMIES PO) Take 1 tablet by mouth at bedtime.    [name] Kitchen ofloxacin (OCUFLOX) 0.3 % ophthalmic solution Place 1 drop into both eyes in the morning, at noon, and at bedtime.    . pioglitazone (ACTOS) 15 MG tablet Take 15 mg by mouth daily.    . prednisoLONE acetate (PRED FORTE) 1 % ophthalmic suspension Place 1 drop into both eyes in the morning, at noon, and at bedtime.    . sitaGLIPtin (JANUVIA)  50 MG tablet Take 50 mg by mouth every evening.    . tamsulosin (FLOMAX) 0.4 MG CAPS capsule Take 0.8 mg by mouth daily.    . TRUEplus Lancets 33G MISC TRUEplus Lancets 33 gauge     No current facility-administered medications on file prior to visit.    LABS/IMAGING: No results found for this or any previous visit (from the past 48 hour(s)). No results found.  LIPID PANEL:    Component Value Date/Time   CHOL 79 (L) 08/02/2020 1014   TRIG 60 08/02/2020 1014   HDL 58 08/02/2020 1014   CHOLHDL 1.4 08/02/2020 1014   LDLCALC 7 08/02/2020 1014    WEIGHTS: Wt Readings from Last 3 Encounters:  10/14/20 221 lb (100.2 kg)  03/23/20 210 lb 14.4 oz (95.7 kg)  01/12/20 235 lb (106.6 kg)    VITALS: BP 129/69   Pulse 69   Ht 5' 10"  (1.778 m)   Wt 221 lb (100.2 kg)   SpO2 100%   BMI 31.71 kg/m   EXAM: Deferred  EKG: Deferred  ASSESSMENT: 1. Progressive fatigue/dyspnea-abnormal CT coronary angiogram with 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  FFR could not be performed due to artifact and there was loss of the mid to distal LAD. 2. Marked dyslipidemia, suggestive of familial hyperlipidemia 3. Type 2 diabetes 4. Hypertension 5. Moderate obesity 6. Prostate cancer status post radiation therapy 7. Family history of premature onset coronary disease  PLAN: 1.   Mr. Labonte is doing well without any anginal symptoms.  He has now established a very low LDL cholesterol.  He is also made dietary changes.  He  is working on additional weight loss.  He had recent cataract surgery and is scheduled for knee replacement in early February.  We will plan to continue his current regimen of lipid therapy and follow-up in 1 year with repeat lipids.  Pixie Casino, MD, [facility], [place] Director of the Advanced Lipid Disorders &  Cardiovascular Risk Reduction Clinic Diplomate of the American Board of Clinical Lipidology Attending Cardiologist  Direct Dial: [phone]  Fax: [phone]  Website:  [email or web address] 10/14/2020, 10:29 AM

## 2020-10-15 NOTE — Patient Instructions (Addendum)
DUE TO COVID-19 ONLY ONE VISITOR IS ALLOWED TO COME WITH YOU AND STAY IN THE WAITING ROOM ONLY DURING PRE OP AND PROCEDURE DAY OF SURGERY. THE 1 VISITOR  MAY VISIT WITH YOU AFTER SURGERY IN YOUR PRIVATE ROOM DURING VISITING HOURS ONLY!  YOU NEED TO HAVE A COVID 19 TEST ON: 10/22/20 @ 11:00 AM , THIS TEST MUST BE DONE BEFORE SURGERY,  COVID TESTING SITE [place] [id number], IT IS ON THE RIGHT GOING OUT [address] APPROXIMATELY  2 MINUTES PAST [employer] ON THE RIGHT. ONCE YOUR COVID TEST IS COMPLETED,  PLEASE BEGIN THE QUARANTINE INSTRUCTIONS AS OUTLINED IN YOUR HANDOUT.                Kevin Olson   Your procedure is scheduled on: 10/26/20   Report to [facility] Main  Entrance   Report to admitting at: 7:30 AM     Call this number if you have problems the morning of surgery [phone]    Remember:   NO SOLID FOOD AFTER MIDNIGHT THE NIGHT PRIOR TO SURGERY. NOTHING BY MOUTH EXCEPT CLEAR LIQUIDS UNTIL: 7:00 AM . PLEASE FINISH GATORADE DRINK PER SURGEON ORDER  WHICH NEEDS TO BE COMPLETED AT: 7:00 AM .  CLEAR LIQUID DIET   Foods Allowed                                                                     Foods Excluded  Coffee and tea, regular and decaf                             liquids that you cannot  Plain Jell-O any favor except red or purple                                           see through such as: Fruit ices (not with fruit pulp)                                     milk, soups, orange juice  Iced Popsicles                                    All solid food Carbonated beverages, regular and diet                                    Cranberry, grape and apple juices Sports drinks like Gatorade Lightly seasoned clear broth or consume(fat free) Sugar, honey syrup  Sample Menu Breakfast                                Lunch  Supper Cranberry juice                    Beef broth                             Chicken broth Jell-O                                     Grape juice                           Apple juice Coffee or tea                        Jell-O                                      Popsicle                                                Coffee or tea                        Coffee or tea  _____________________________________________________________________  BRUSH YOUR TEETH MORNING OF SURGERY AND RINSE YOUR MOUTH OUT, NO CHEWING GUM CANDY OR MINTS.    Take these medicines the morning of surgery with A SIP OF WATER: synthroid,tamsulosin.  How to Manage Your Diabetes Before and After Surgery  Why is it important to control my blood sugar before and after surgery? . Improving blood sugar levels before and after surgery helps healing and can limit problems. . A way of improving blood sugar control is eating a healthy diet by: o  Eating less sugar and carbohydrates o  Increasing activity/exercise o  Talking with your doctor about reaching your blood sugar goals . High blood sugars (greater than 180 mg/dL) can raise your risk of infections and slow your recovery, so you will need to focus on controlling your diabetes during the weeks before surgery. . Make sure that the doctor who takes care of your diabetes knows about your planned surgery including the date and location.  How do I manage my blood sugar before surgery? . Check your blood sugar at least 4 times a day, starting 2 days before surgery, to make sure that the level is not too high or low. o Check your blood sugar the morning of your surgery when you wake up and every 2 hours until you get to the Short Stay unit. . If your blood sugar is less than 70 mg/dL, you will need to treat for low blood sugar: o Do not take insulin. o Treat a low blood sugar (less than 70 mg/dL) with  cup of clear juice (cranberry or apple), 4 glucose tablets, OR glucose gel. o Recheck blood sugar in 15 minutes after treatment (to make sure it is  greater than 70 mg/dL). If your blood sugar is not greater than 70 mg/dL on recheck, call [phone] for further instructions. . Report your blood sugar to the short stay nurse when you get to Short Stay.  . If you are admitted  to the hospital after surgery: o Your blood sugar will be checked by the staff and you will probably be given insulin after surgery (instead of oral diabetes medicines) to make sure you have good blood sugar levels. o The goal for blood sugar control after surgery is 80-180 mg/dL.   WHAT DO I DO ABOUT MY DIABETES MEDICATION?  [name] Kitchen Do not take oral diabetes medicines (pills) the morning of surgery.  . THE DAY BEFORE SURGERY, take Metformin,Glimepiride,Pioglitazone and Januvia as usual.       THE MORNING OF SURGERY, DO NOT TAKE ANY DIABETIC MEDICATIONS DAY OF YOUR SURGERY                               You may not have any metal on your body including hair pins and              piercings  Do not wear jewelry, lotions, powders or perfumes, deodorant             Men may shave face and neck.   Do not bring valuables to the hospital. Kevin Olson.  Contacts, dentures or bridgework may not be worn into surgery.  Leave suitcase in the car. After surgery it may be brought to your room.     Patients discharged the day of surgery will not be allowed to drive home. IF YOU ARE HAVING SURGERY AND GOING HOME THE SAME DAY, YOU MUST HAVE AN ADULT TO DRIVE YOU HOME AND BE WITH YOU FOR 24 HOURS. YOU MAY GO HOME BY TAXI OR UBER OR ORTHERWISE, BUT AN ADULT MUST ACCOMPANY YOU HOME AND STAY WITH YOU FOR 24 HOURS.  Name and phone number of your driver:  Special Instructions: N/A              Please read over the following fact sheets you were given: _____________________________________________________________________         [facility] - Preparing for Surgery Before surgery, you can play an important role.  Because skin is not sterile,  your skin needs to be as free of germs as possible.  You can reduce the number of germs on your skin by washing with CHG (chlorahexidine gluconate) soap before surgery.  CHG is an antiseptic cleaner which kills germs and bonds with the skin to continue killing germs even after washing. Please DO NOT use if you have an allergy to CHG or antibacterial soaps.  If your skin becomes reddened/irritated stop using the CHG and inform your nurse when you arrive at Short Stay. Do not shave (including legs and underarms) for at least 48 hours prior to the first CHG shower.  You may shave your face/neck. Please follow these instructions carefully:  1.  Shower with CHG Soap the night before surgery and the  morning of Surgery.  2.  If you choose to wash your hair, wash your hair first as usual with your  normal  shampoo.  3.  After you shampoo, rinse your hair and body thoroughly to remove the  shampoo.                           4.  Use CHG as you would any other liquid soap.  You can apply chg directly  to the skin and wash  Gently with a scrungie or clean washcloth.  5.  Apply the CHG Soap to your body ONLY FROM THE NECK DOWN.   Do not use on face/ open                           Wound or open sores. Avoid contact with eyes, ears mouth and genitals (private parts).                       Wash face,  Genitals (private parts) with your normal soap.             6.  Wash thoroughly, paying special attention to the area where your surgery  will be performed.  7.  Thoroughly rinse your body with warm water from the neck down.  8.  DO NOT shower/wash with your normal soap after using and rinsing off  the CHG Soap.                9.  Pat yourself dry with a clean towel.            10.  Wear clean pajamas.            11.  Place clean sheets on your bed the night of your first shower and do not  sleep with pets. Day of Surgery : Do not apply any lotions/deodorants the morning of surgery.  Please wear  clean clothes to the hospital/surgery center.  FAILURE TO FOLLOW THESE INSTRUCTIONS MAY RESULT IN THE CANCELLATION OF YOUR SURGERY PATIENT SIGNATURE_________________________________  NURSE SIGNATURE__________________________________  ________________________________________________________________________   Adam Phenix  An incentive spirometer is a tool that can help keep your lungs clear and active. This tool measures how well you are filling your lungs with each breath. Taking long deep breaths may help reverse or decrease the chance of developing breathing (pulmonary) problems (especially infection) following:  A long period of time when you are unable to move or be active. BEFORE THE PROCEDURE   If the spirometer includes an indicator to show your best effort, your nurse or respiratory therapist will set it to a desired goal.  If possible, sit up straight or lean slightly forward. Try not to slouch.  Hold the incentive spirometer in an upright position. INSTRUCTIONS FOR USE  1. Sit on the edge of your bed if possible, or sit up as far as you can in bed or on a chair. 2. Hold the incentive spirometer in an upright position. 3. Breathe out normally. 4. Place the mouthpiece in your mouth and seal your lips tightly around it. 5. Breathe in slowly and as deeply as possible, raising the piston or the ball toward the top of the column. 6. Hold your breath for 3-5 seconds or for as long as possible. Allow the piston or ball to fall to the bottom of the column. 7. Remove the mouthpiece from your mouth and breathe out normally. 8. Rest for a few seconds and repeat Steps 1 through 7 at least 10 times every 1-2 hours when you are awake. Take your time and take a few normal breaths between deep breaths. 9. The spirometer may include an indicator to show your best effort. Use the indicator as a goal to work toward during each repetition. 10. After each set of 10 deep breaths, practice  coughing to be sure your lungs are clear. If you have an incision (the cut made at the time of  surgery), support your incision when coughing by placing a pillow or rolled up towels firmly against it. Once you are able to get out of bed, walk around indoors and cough well. You may stop using the incentive spirometer when instructed by your caregiver.  RISKS AND COMPLICATIONS  Take your time so you do not get dizzy or light-headed.  If you are in pain, you may need to take or ask for pain medication before doing incentive spirometry. It is harder to take a deep breath if you are having pain. AFTER USE  Rest and breathe slowly and easily.  It can be helpful to keep track of a log of your progress. Your caregiver can provide you with a simple table to help with this. If you are using the spirometer at home, follow these instructions: [place] IF:   You are having difficultly using the spirometer.  You have trouble using the spirometer as often as instructed.  Your pain medication is not giving enough relief while using the spirometer.  You develop fever of 100.5 F (38.1 C) or higher. SEEK IMMEDIATE MEDICAL CARE IF:   You cough up bloody sputum that had not been present before.  You develop fever of 102 F (38.9 C) or greater.  You develop worsening pain at or near the incision site. MAKE SURE YOU:   Understand these instructions.  Will watch your condition.  Will get help right away if you are not doing well or get worse. Document Released: 01/15/2007 Document Revised: 11/27/2011 Document Reviewed: 03/18/2007 [facility] Patient Information 2014 [place], [place].   ________________________________________________________________________

## 2020-10-18 ENCOUNTER — Encounter (HOSPITAL_COMMUNITY)
Admission: RE | Admit: 2020-10-18 | Discharge: 2020-10-18 | Disposition: A | Discharge status: Home / Self Care | Payer: Medicare HMO | Source: Ambulatory Visit | Attending: Orthopedic Surgery | Admitting: Orthopedic Surgery

## 2020-10-18 ENCOUNTER — Encounter (HOSPITAL_COMMUNITY): Payer: Self-pay

## 2020-10-18 ENCOUNTER — Other Ambulatory Visit: Payer: Self-pay

## 2020-10-18 DIAGNOSIS — Z01818 Encounter for other preprocedural examination: Secondary | ICD-10-CM | POA: Diagnosis not present

## 2020-10-18 HISTORY — DX: Depression, unspecified: F32.A

## 2020-10-18 LAB — BASIC METABOLIC PANEL
Anion gap: 9 (ref 5–15)
BUN: 25 mg/dL — ABNORMAL HIGH (ref 8–23)
CO2: 24 mmol/L (ref 22–32)
Calcium: 9.8 mg/dL (ref 8.9–10.3)
Chloride: 109 mmol/L (ref 98–111)
Creatinine, Ser: 0.77 mg/dL (ref 0.61–1.24)
GFR, Estimated: >60 mL/min (ref >60)
Glucose, Bld: 198 mg/dL — ABNORMAL HIGH (ref 70–99)
Potassium: 4.3 mmol/L (ref 3.5–5.1)
Sodium: 142 mmol/L (ref 135–145)

## 2020-10-18 LAB — CBC
HCT: 37.1 % — ABNORMAL LOW (ref 39.0–52.0)
Hemoglobin: 11.5 g/dL — ABNORMAL LOW (ref 13.0–17.0)
MCH: 26.6 pg (ref 26.0–34.0)
MCHC: 31 g/dL (ref 30.0–36.0)
MCV: 85.7 fL (ref 80.0–100.0)
Platelets: 283 10*3/uL (ref 150–400)
RBC: 4.33 MIL/uL (ref 4.22–5.81)
RDW: 15.3 % (ref 11.5–15.5)
WBC: 4.1 10*3/uL (ref 4.0–10.5)
nRBC: 0 % (ref 0.0–0.2)

## 2020-10-18 LAB — HEMOGLOBIN A1C
Hgb A1c MFr Bld: 7.1 % — ABNORMAL HIGH (ref 4.8–5.6)
Mean Plasma Glucose: 157.07 mg/dL

## 2020-10-18 LAB — SURGICAL PCR SCREEN
MRSA, PCR: NEGATIVE
Staphylococcus aureus: NEGATIVE

## 2020-10-18 NOTE — Progress Notes (Signed)
COVID Vaccine Completed: Yes Date COVID Vaccine completed: 07/2020 [place] COVID vaccine manufacturer: [place]      PCP - Sammuel Hines PA-C Cardiologist - Dr. Lyman Bishop  Chest x-ray -  EKG -  Stress Test -  ECHO -  Cardiac Cath -  Pacemaker/ICD device last checked:  Sleep Study -  CPAP -   Fasting Blood Sugar - 150's Checks Blood Sugar ___1__ times a week.  Blood Thinner Instructions: Aspirin Instructions: Last Dose:  Anesthesia review: Hx: DIA,HTN  Patient denies shortness of breath, fever, cough and chest pain at PAT appointment   Patient verbalized understanding of instructions that were given to them at the PAT appointment. Patient was also instructed that they will need to review over the PAT instructions again at home before surgery.

## 2020-10-22 ENCOUNTER — Other Ambulatory Visit (HOSPITAL_COMMUNITY)
Admission: RE | Admit: 2020-10-22 | Discharge: 2020-10-22 | Disposition: A | Discharge status: Home / Self Care | Payer: Medicare HMO | Source: Ambulatory Visit | Attending: Orthopedic Surgery | Admitting: Orthopedic Surgery

## 2020-10-22 DIAGNOSIS — Z20822 Contact with and (suspected) exposure to covid-19: Secondary | ICD-10-CM | POA: Insufficient documentation

## 2020-10-22 DIAGNOSIS — Z01812 Encounter for preprocedural laboratory examination: Secondary | ICD-10-CM | POA: Diagnosis not present

## 2020-10-22 LAB — SARS CORONAVIRUS 2 (TAT 6-24 HRS): SARS Coronavirus 2: NEGATIVE

## 2020-10-25 NOTE — H&P (Signed)
TOTAL KNEE ADMISSION H&P  Patient is being admitted for left total knee arthroplasty.  Subjective:  Chief Complaint:left knee pain.  HPI: Kevin Olson, 75 y.o. male, has a history of pain and functional disability in the left knee due to arthritis and has failed non-surgical conservative treatments for greater than 12 weeks to includeNSAID's and/or analgesics and activity modification.  Onset of symptoms was gradual, starting 2 years ago with gradually worsening course since that time. The patient noted no past surgery on the left knee(s).  Patient currently rates pain in the left knee(s) at 8 out of 10 with activity. Patient has worsening of pain with activity and weight bearing and pain that interferes with activities of daily living.  Patient has evidence of joint space narrowing by imaging studies.  There is no active infection.  Patient Active Problem List   Diagnosis Date Noted  . Malignant neoplasm of prostate ([place]) 07/22/2019  . Osteoarthritis of left knee 10/02/2018  . Osteoarthritis of right knee 10/02/2018  . Pain in left knee 09/03/2018  . Pain in right knee 09/03/2018  . Lumbar pain 12/17/2017  . Spinal stenosis of lumbar region 12/17/2017   Past Medical History:  Diagnosis Date  . Depression   . DM2 (diabetes mellitus, type 2) ([place])   . Dyslipidemia   . Hypertension   . OA (osteoarthritis) of knee   . Prostate cancer [facility])    prostate    Past Surgical History:  Procedure Laterality Date  . INGUINAL HERNIA REPAIR    . PROSTATE BIOPSY      No current facility-administered medications for this encounter.   Current Outpatient Medications  Medication Sig Dispense Refill Last Dose  . acetaminophen (TYLENOL) 500 MG tablet Take 500-1,000 mg by mouth 2 (two) times daily as needed (pain.).     [name] Kitchen ALPRAZolam (XANAX) 1 MG tablet Take 1 mg by mouth at bedtime.     [name] Kitchen aspirin EC 81 MG tablet Take 81 mg by mouth daily.     [name] Kitchen atorvastatin (LIPITOR) 10 MG tablet Take 10 mg by  mouth every evening.     . baclofen (LIORESAL) 10 MG tablet Take 10 mg by mouth 3 (three) times daily.     . celecoxib (CELEBREX) 200 MG capsule Take 200 mg by mouth at bedtime.     . Cholecalciferol (VITAMIN D3) 50 MCG (2000 UT) TABS Take 2,000 Units by mouth every evening.     . Coenzyme Q10-Vitamin E (QUNOL ULTRA COQ10 PO) Take 1 capsule by mouth at bedtime.     . DULoxetine (CYMBALTA) 60 MG capsule Take 60 mg by mouth every evening.     [name] Kitchen glimepiride (AMARYL) 4 MG tablet Take 4 mg by mouth daily with breakfast.     . ibuprofen (ADVIL) 200 MG tablet Take 200-400 mg by mouth 2 (two) times daily as needed (pain.).     [name] Kitchen ketorolac (ACULAR) 0.5 % ophthalmic solution Place 1 drop into both eyes in the morning, at noon, and at bedtime.     [name] Kitchen levothyroxine (SYNTHROID) 100 MCG tablet Take 100 mcg by mouth.     . losartan (COZAAR) 25 MG tablet Take 12.5 mg by mouth every evening.     . metFORMIN (GLUCOPHAGE) 1000 MG tablet Take 1,000 mg by mouth in the morning and at bedtime.     . Misc Natural Products (ADV TURMERIC CURCUMIN COMPLEX PO) Take 1 capsule by mouth every evening. Qunol Turmeric Curcumin Complex     . Multiple Vitamin (  MULTIVITAMIN WITH MINERALS) TABS tablet Take 1 tablet by mouth daily. Centrum Silver     . Multiple Vitamins-Minerals (OCUVITE EYE HEATLH GUMMIES PO) Take 1 tablet by mouth at bedtime.     [name] Kitchen ofloxacin (OCUFLOX) 0.3 % ophthalmic solution Place 1 drop into both eyes in the morning, at noon, and at bedtime.     . pioglitazone (ACTOS) 15 MG tablet Take 15 mg by mouth daily.     . prednisoLONE acetate (PRED FORTE) 1 % ophthalmic suspension Place 1 drop into both eyes in the morning, at noon, and at bedtime.     . sitaGLIPtin (JANUVIA) 50 MG tablet Take 50 mg by mouth every evening.     . tamsulosin (FLOMAX) 0.4 MG CAPS capsule Take 0.8 mg by mouth daily.     . Blood Glucose Monitoring Suppl (TRUE METRIX AIR GLUCOSE METER) DEVI True Metrix Air Glucose Meter kit     . Evolocumab  (REPATHA SURECLICK) 557 MG/ML SOAJ Inject 1 Dose into the skin every 14 (fourteen) days. 2 mL 6   . glucose blood (TRUE METRIX BLOOD GLUCOSE TEST) test strip True Metrix Glucose Test Strip     . TRUEplus Lancets 33G MISC TRUEplus Lancets 33 gauge      No Known Allergies  Social History   Tobacco Use  . Smoking status: Former Smoker    Packs/day: 2.00    Years: 12.00    Pack years: 24.00    Types: Cigarettes    Quit date: 04/19/1979    Years since quitting: 41.5  . Smokeless tobacco: Never Used  Substance Use Topics  . Alcohol use: Yes    Comment: occas.    Family History  Problem Relation Age of Onset  . Pancreatic cancer Sister   . Other Sister        brain tumor  . CAD Mother   . CAD Brother        CABGx4   . CAD Brother        CABGx4  . Colon cancer Neg Hx   . Breast cancer Neg Hx      Review of Systems  Constitutional: Negative for chills and fever.  Respiratory: Negative for cough and shortness of breath.   Cardiovascular: Negative for chest pain.  Gastrointestinal: Negative for nausea and vomiting.  Musculoskeletal: Positive for arthralgias.    Objective:  Physical Exam Patient is a 74 year old male.  Well nourished and well developed. General: Alert and oriented x3, cooperative and pleasant, no acute distress. Head: normocephalic, atraumatic, neck supple. Eyes: EOMI. Respiratory: breath sounds clear in all fields, no wheezing, rales, or rhonchi. Cardiovascular: Regular rate and rhythm, no murmurs, gallops or rubs.  Musculoskeletal:  Bilateral knee exam: Slight flexion contracture Flexion over 100 with tightness No palpable effusion, warmth or erythema Slight varus to both knees Crepitation with range of motion Neurovascular intact distally Stable medial lateral collateral ligaments bilaterally  Calves soft and nontender. Motor function intact in LE. Strength 5/5 LE bilaterally. Neuro: Distal pulses 2+. Sensation to light touch intact in  LE. Vital signs in last 24 hours:    Labs:   Estimated body mass index is 32.19 kg/m as calculated from the following:   Height as of 10/18/20: 5' 9" (1.753 m).   Weight as of 10/18/20: 98.9 kg.   Imaging Review Plain radiographs demonstrate severe degenerative joint disease of the left knee(s). The overall alignment isneutral. The bone quality appears to be adequate for age and reported activity level.  Assessment/Plan:  End stage arthritis, left knee   The patient history, physical examination, clinical judgment of the provider and imaging studies are consistent with end stage degenerative joint disease of the left knee(s) and total knee arthroplasty is deemed medically necessary. The treatment options including medical management, injection therapy arthroscopy and arthroplasty were discussed at length. The risks and benefits of total knee arthroplasty were presented and reviewed. The risks due to aseptic loosening, infection, stiffness, patella tracking problems, thromboembolic complications and other imponderables were discussed. The patient acknowledged the explanation, agreed to proceed with the plan and consent was signed. Patient is being admitted for inpatient treatment for surgery, pain control, PT, OT, prophylactic antibiotics, VTE prophylaxis, progressive ambulation and ADL's and discharge planning. The patient is planning to be discharged home.  Therapy Plans: HEP Disposition: Home with wife Planned DVT Prophylaxis: aspirin 40m BID DME needed: walker PCP: Dr. SElesa HackerCardiologist: Dr. HDebara PickettTXA: IV Allergies: NKDA Anesthesia Concerns: none BMI: 33.1 Last HgbA1c: 7.1%  Other: Planning on SDD. Hx of prostate CA in 2021, s/p radiation & seed placement. Norco okay.  No rx given.   Patient's anticipated LOS is less than 2 midnights, meeting these requirements: - Lives within 1 hour of care - Has a competent adult at home to recover with post-op recover -  NO history of  - Chronic pain requiring opiods  - Diabetes  - Coronary Artery Disease  - Heart failure  - Heart attack  - Stroke  - DVT/VTE  - Cardiac arrhythmia  - Respiratory Failure/COPD  - Renal failure  - Anemia  - Advanced Liver disease  - Patient was instructed on what medications to stop prior to surgery. - Follow-up visit in 2 weeks with Dr. OAlvan Dame- Begin physical therapy following surgery - Pre-operative lab work as pre-surgical testing - Prescriptions will be provided in hospital at time of discharge  AGriffith Citron PA-C Orthopedic Surgery [facility] [place]([phone]

## 2020-10-26 ENCOUNTER — Encounter (HOSPITAL_COMMUNITY): Payer: Self-pay | Admitting: Orthopedic Surgery

## 2020-10-26 ENCOUNTER — Ambulatory Visit (HOSPITAL_COMMUNITY): Payer: Medicare HMO | Admitting: Certified Registered Nurse Anesthetist

## 2020-10-26 ENCOUNTER — Encounter (HOSPITAL_COMMUNITY)
Admission: RE | Disposition: A | Discharge status: Home / Self Care | Payer: Self-pay | Source: Ambulatory Visit | Attending: Orthopedic Surgery

## 2020-10-26 ENCOUNTER — Ambulatory Visit (HOSPITAL_COMMUNITY)
Admission: RE | Admit: 2020-10-26 | Discharge: 2020-10-26 | Disposition: A | Discharge status: Home / Self Care | Payer: Medicare HMO | Source: Ambulatory Visit | Attending: Orthopedic Surgery | Admitting: Orthopedic Surgery

## 2020-10-26 ENCOUNTER — Ambulatory Visit (HOSPITAL_COMMUNITY): Payer: Medicare HMO | Admitting: Physician Assistant

## 2020-10-26 DIAGNOSIS — Z79899 Other long term (current) drug therapy: Secondary | ICD-10-CM | POA: Diagnosis not present

## 2020-10-26 DIAGNOSIS — I10 Essential (primary) hypertension: Secondary | ICD-10-CM | POA: Diagnosis not present

## 2020-10-26 DIAGNOSIS — Z7982 Long term (current) use of aspirin: Secondary | ICD-10-CM | POA: Diagnosis not present

## 2020-10-26 DIAGNOSIS — Z96652 Presence of left artificial knee joint: Secondary | ICD-10-CM

## 2020-10-26 DIAGNOSIS — Z7984 Long term (current) use of oral hypoglycemic drugs: Secondary | ICD-10-CM | POA: Insufficient documentation

## 2020-10-26 DIAGNOSIS — G8918 Other acute postprocedural pain: Secondary | ICD-10-CM | POA: Diagnosis not present

## 2020-10-26 DIAGNOSIS — Z79890 Hormone replacement therapy (postmenopausal): Secondary | ICD-10-CM | POA: Diagnosis not present

## 2020-10-26 DIAGNOSIS — M1712 Unilateral primary osteoarthritis, left knee: Secondary | ICD-10-CM | POA: Diagnosis not present

## 2020-10-26 DIAGNOSIS — Z87891 Personal history of nicotine dependence: Secondary | ICD-10-CM | POA: Diagnosis not present

## 2020-10-26 DIAGNOSIS — M25762 Osteophyte, left knee: Secondary | ICD-10-CM | POA: Diagnosis not present

## 2020-10-26 DIAGNOSIS — Z791 Long term (current) use of non-steroidal anti-inflammatories (NSAID): Secondary | ICD-10-CM | POA: Diagnosis not present

## 2020-10-26 DIAGNOSIS — E785 Hyperlipidemia, unspecified: Secondary | ICD-10-CM | POA: Diagnosis not present

## 2020-10-26 HISTORY — PX: TOTAL KNEE ARTHROPLASTY: SHX125

## 2020-10-26 LAB — TYPE AND SCREEN
ABO/RH(D): O POS
Antibody Screen: NEGATIVE

## 2020-10-26 LAB — GLUCOSE, CAPILLARY
Glucose-Capillary: 128 mg/dL — ABNORMAL HIGH (ref 70–99)
Glucose-Capillary: 190 mg/dL — ABNORMAL HIGH (ref 70–99)

## 2020-10-26 LAB — ABO/RH: ABO/RH(D): O POS

## 2020-10-26 SURGERY — ARTHROPLASTY, KNEE, TOTAL
Anesthesia: Regional | Site: Knee | Laterality: Left

## 2020-10-26 MED ORDER — KETOROLAC TROMETHAMINE 30 MG/ML IJ SOLN
INTRAMUSCULAR | Status: DC | PRN
  Administered 2020-10-26: 30 mg

## 2020-10-26 MED ORDER — ACETAMINOPHEN 10 MG/ML IV SOLN
INTRAVENOUS | Status: DC | PRN
  Administered 2020-10-26: 1000 mg via INTRAVENOUS

## 2020-10-26 MED ORDER — PROPOFOL 10 MG/ML IV BOLUS
INTRAVENOUS | Status: DC | PRN
  Administered 2020-10-26: 150 mg via INTRAVENOUS

## 2020-10-26 MED ORDER — METOCLOPRAMIDE HCL 5 MG/ML IJ SOLN: 5.0000 mg | Freq: Three times a day (TID) | INTRAMUSCULAR | Status: DC | PRN

## 2020-10-26 MED ORDER — LACTATED RINGERS IV BOLUS
500.0000 mL | Freq: Once | INTRAVENOUS | Status: AC
  Administered 2020-10-26: 500 mL via INTRAVENOUS

## 2020-10-26 MED ORDER — EPHEDRINE 5 MG/ML INJ
INTRAVENOUS | Status: AC
  Filled 2020-10-26: qty 10

## 2020-10-26 MED ORDER — ONDANSETRON HCL 4 MG/2ML IJ SOLN
INTRAMUSCULAR | Status: DC | PRN
  Administered 2020-10-26: 4 mg via INTRAVENOUS

## 2020-10-26 MED ORDER — HYDROCODONE-ACETAMINOPHEN 7.5-325 MG PO TABS: 1.0000 | ORAL_TABLET | ORAL | Status: DC | PRN

## 2020-10-26 MED ORDER — DEXAMETHASONE SODIUM PHOSPHATE 10 MG/ML IJ SOLN
INTRAMUSCULAR | Status: AC
  Filled 2020-10-26: qty 1

## 2020-10-26 MED ORDER — METHOCARBAMOL 500 MG IVPB - SIMPLE MED: 500.0000 mg | Freq: Four times a day (QID) | INTRAVENOUS | Status: DC | PRN

## 2020-10-26 MED ORDER — PROPOFOL 10 MG/ML IV BOLUS
INTRAVENOUS | Status: AC
  Filled 2020-10-26: qty 20

## 2020-10-26 MED ORDER — BUPIVACAINE-EPINEPHRINE (PF) 0.25% -1:200000 IJ SOLN
INTRAMUSCULAR | Status: DC | PRN
  Administered 2020-10-26: 30 mL

## 2020-10-26 MED ORDER — ONDANSETRON HCL 4 MG/2ML IJ SOLN: 4.0000 mg | Freq: Four times a day (QID) | INTRAMUSCULAR | Status: DC | PRN

## 2020-10-26 MED ORDER — SODIUM CHLORIDE 0.9 % IR SOLN
Status: DC | PRN
  Administered 2020-10-26: 1000 mL

## 2020-10-26 MED ORDER — TRANEXAMIC ACID-NACL 1000-0.7 MG/100ML-% IV SOLN
INTRAVENOUS | Status: AC
  Filled 2020-10-26: qty 100

## 2020-10-26 MED ORDER — TRANEXAMIC ACID-NACL 1000-0.7 MG/100ML-% IV SOLN
1000.0000 mg | Freq: Once | INTRAVENOUS | Status: AC
  Administered 2020-10-26: 1000 mg via INTRAVENOUS

## 2020-10-26 MED ORDER — POVIDONE-IODINE 10 % EX SWAB
2.0000 | Freq: Once | CUTANEOUS | Status: AC
Start: 2020-10-26 — End: 2020-10-26
  Administered 2020-10-26: 2 via TOPICAL

## 2020-10-26 MED ORDER — CHLORHEXIDINE GLUCONATE 0.12 % MT SOLN
15.0000 mL | Freq: Once | OROMUCOSAL | Status: AC
  Administered 2020-10-26: 15 mL via OROMUCOSAL

## 2020-10-26 MED ORDER — EPHEDRINE SULFATE-NACL 50-0.9 MG/10ML-% IV SOSY
PREFILLED_SYRINGE | INTRAVENOUS | Status: DC | PRN
  Administered 2020-10-26 (×4): 10 mg via INTRAVENOUS
  Administered 2020-10-26: 15 mg via INTRAVENOUS
  Administered 2020-10-26: 5 mg via INTRAVENOUS

## 2020-10-26 MED ORDER — BUPIVACAINE-EPINEPHRINE (PF) 0.25% -1:200000 IJ SOLN
INTRAMUSCULAR | Status: AC
  Filled 2020-10-26: qty 30

## 2020-10-26 MED ORDER — ACETAMINOPHEN 325 MG PO TABS: 325.0000 mg | ORAL_TABLET | Freq: Four times a day (QID) | ORAL | Status: DC | PRN

## 2020-10-26 MED ORDER — ASPIRIN 81 MG PO CHEW: 81.0000 mg | CHEWABLE_TABLET | Freq: Two times a day (BID) | ORAL | 0 refills | Status: AC

## 2020-10-26 MED ORDER — FENTANYL CITRATE (PF) 100 MCG/2ML IJ SOLN
50.0000 ug | INTRAMUSCULAR | Status: DC
  Administered 2020-10-26: 100 ug via INTRAVENOUS
  Filled 2020-10-26: qty 2

## 2020-10-26 MED ORDER — FENTANYL CITRATE (PF) 100 MCG/2ML IJ SOLN
INTRAMUSCULAR | Status: DC | PRN
  Administered 2020-10-26 (×2): 50 ug via INTRAVENOUS

## 2020-10-26 MED ORDER — LACTATED RINGERS IV BOLUS
250.0000 mL | Freq: Once | INTRAVENOUS | Status: AC
  Administered 2020-10-26: 250 mL via INTRAVENOUS

## 2020-10-26 MED ORDER — TRANEXAMIC ACID-NACL 1000-0.7 MG/100ML-% IV SOLN
1000.0000 mg | INTRAVENOUS | Status: AC
Start: 2020-10-26 — End: 2020-10-26
  Administered 2020-10-26: 1000 mg via INTRAVENOUS
  Filled 2020-10-26: qty 100

## 2020-10-26 MED ORDER — FENTANYL CITRATE (PF) 100 MCG/2ML IJ SOLN: 25.0000 ug | INTRAMUSCULAR | Status: DC | PRN

## 2020-10-26 MED ORDER — SODIUM CHLORIDE (PF) 0.9 % IJ SOLN
INTRAMUSCULAR | Status: AC
  Filled 2020-10-26: qty 30

## 2020-10-26 MED ORDER — CEFAZOLIN SODIUM-DEXTROSE 2-4 GM/100ML-% IV SOLN
INTRAVENOUS | Status: AC
  Filled 2020-10-26: qty 100

## 2020-10-26 MED ORDER — METHOCARBAMOL 500 MG PO TABS: 500.0000 mg | ORAL_TABLET | Freq: Four times a day (QID) | ORAL | 0 refills | Status: DC | PRN

## 2020-10-26 MED ORDER — POLYETHYLENE GLYCOL 3350 17 G PO PACK: 17.0000 g | PACK | Freq: Two times a day (BID) | ORAL | 0 refills | Status: DC

## 2020-10-26 MED ORDER — HYDROMORPHONE HCL 1 MG/ML IJ SOLN
INTRAMUSCULAR | Status: DC | PRN
  Administered 2020-10-26 (×2): 1 mg via INTRAVENOUS

## 2020-10-26 MED ORDER — LIDOCAINE 2% (20 MG/ML) 5 ML SYRINGE
INTRAMUSCULAR | Status: DC | PRN
  Administered 2020-10-26: 100 mg via INTRAVENOUS

## 2020-10-26 MED ORDER — CELECOXIB 200 MG PO CAPS
200.0000 mg | ORAL_CAPSULE | Freq: Two times a day (BID) | ORAL | Status: DC
Start: 2020-10-26 — End: 2020-10-26

## 2020-10-26 MED ORDER — KETOROLAC TROMETHAMINE 15 MG/ML IJ SOLN: 15.0000 mg | Freq: Once | INTRAMUSCULAR | Status: DC

## 2020-10-26 MED ORDER — METHOCARBAMOL 500 MG PO TABS: 500.0000 mg | ORAL_TABLET | Freq: Four times a day (QID) | ORAL | Status: DC | PRN

## 2020-10-26 MED ORDER — ONDANSETRON HCL 4 MG/2ML IJ SOLN
INTRAMUSCULAR | Status: AC
  Filled 2020-10-26: qty 2

## 2020-10-26 MED ORDER — KETOROLAC TROMETHAMINE 30 MG/ML IJ SOLN
INTRAMUSCULAR | Status: AC
  Filled 2020-10-26: qty 1

## 2020-10-26 MED ORDER — ACETAMINOPHEN 10 MG/ML IV SOLN
INTRAVENOUS | Status: AC
  Filled 2020-10-26: qty 100

## 2020-10-26 MED ORDER — ONDANSETRON HCL 4 MG PO TABS
4.0000 mg | ORAL_TABLET | Freq: Four times a day (QID) | ORAL | Status: DC | PRN
  Filled 2020-10-26: qty 1

## 2020-10-26 MED ORDER — ORAL CARE MOUTH RINSE: 15.0000 mL | Freq: Once | OROMUCOSAL | Status: AC

## 2020-10-26 MED ORDER — KETOROLAC TROMETHAMINE 30 MG/ML IJ SOLN
INTRAMUSCULAR | Status: DC | PRN
  Administered 2020-10-26: 15 mg via INTRAVENOUS

## 2020-10-26 MED ORDER — ONDANSETRON HCL 4 MG/2ML IJ SOLN: 4.0000 mg | Freq: Once | INTRAMUSCULAR | Status: DC | PRN

## 2020-10-26 MED ORDER — CEFAZOLIN SODIUM-DEXTROSE 2-4 GM/100ML-% IV SOLN
2.0000 g | INTRAVENOUS | Status: AC
  Administered 2020-10-26: 2 g via INTRAVENOUS
  Filled 2020-10-26: qty 100

## 2020-10-26 MED ORDER — SODIUM CHLORIDE (PF) 0.9 % IJ SOLN
INTRAMUSCULAR | Status: DC | PRN
  Administered 2020-10-26: 30 mL

## 2020-10-26 MED ORDER — FERROUS SULFATE 325 (65 FE) MG PO TABS: 325.0000 mg | ORAL_TABLET | Freq: Three times a day (TID) | ORAL | 0 refills | Status: DC

## 2020-10-26 MED ORDER — HYDROMORPHONE HCL 2 MG/ML IJ SOLN
INTRAMUSCULAR | Status: AC
  Filled 2020-10-26: qty 1

## 2020-10-26 MED ORDER — DEXAMETHASONE SODIUM PHOSPHATE 10 MG/ML IJ SOLN
10.0000 mg | Freq: Once | INTRAMUSCULAR | Status: AC
  Administered 2020-10-26: 10 mg via INTRAVENOUS

## 2020-10-26 MED ORDER — CEFAZOLIN SODIUM-DEXTROSE 2-4 GM/100ML-% IV SOLN
2.0000 g | Freq: Four times a day (QID) | INTRAVENOUS | Status: DC
  Administered 2020-10-26: 2 g via INTRAVENOUS

## 2020-10-26 MED ORDER — HYDROCODONE-ACETAMINOPHEN 5-325 MG PO TABS: 1.0000 | ORAL_TABLET | ORAL | Status: DC | PRN

## 2020-10-26 MED ORDER — FENTANYL CITRATE (PF) 100 MCG/2ML IJ SOLN
INTRAMUSCULAR | Status: AC
  Filled 2020-10-26: qty 2

## 2020-10-26 MED ORDER — LACTATED RINGERS IV SOLN
INTRAVENOUS | Status: DC
  Administered 2020-10-26: 1000 mL via INTRAVENOUS

## 2020-10-26 MED ORDER — MIDAZOLAM HCL 2 MG/2ML IJ SOLN
1.0000 mg | INTRAMUSCULAR | Status: DC
  Filled 2020-10-26: qty 2

## 2020-10-26 MED ORDER — STERILE WATER FOR IRRIGATION IR SOLN
Status: DC | PRN
  Administered 2020-10-26: 2000 mL

## 2020-10-26 MED ORDER — ACETAMINOPHEN 10 MG/ML IV SOLN: 1000.0000 mg | Freq: Once | INTRAVENOUS | Status: DC | PRN

## 2020-10-26 MED ORDER — METOCLOPRAMIDE HCL 5 MG PO TABS
5.0000 mg | ORAL_TABLET | Freq: Three times a day (TID) | ORAL | Status: DC | PRN
  Filled 2020-10-26: qty 2

## 2020-10-26 MED ORDER — HYDROCODONE-ACETAMINOPHEN 7.5-325 MG PO TABS: 1.0000 | ORAL_TABLET | ORAL | 0 refills | Status: DC | PRN

## 2020-10-26 MED ORDER — DOCUSATE SODIUM 100 MG PO CAPS: 100.0000 mg | ORAL_CAPSULE | Freq: Two times a day (BID) | ORAL | 0 refills | Status: DC

## 2020-10-26 MED ORDER — ROPIVACAINE HCL 5 MG/ML IJ SOLN
INTRAMUSCULAR | Status: DC | PRN
  Administered 2020-10-26: 30 mL via PERINEURAL

## 2020-10-26 SURGICAL SUPPLY — 53 items
ATTUNE MED ANAT PAT 41 KNEE (Knees) ×2 IMPLANT
ATTUNE PS FEM LT SZ 7 CEM KNEE (Femur) ×2 IMPLANT
ATTUNE PSRP INSR SZ7 5 KNEE (Insert) ×2 IMPLANT
BAG ZIPLOCK 12X15 (MISCELLANEOUS) IMPLANT
BASE TIBIAL ROT PLAT SZ 7 KNEE (Knees) ×1 IMPLANT
BLADE SAW SGTL 11.0X1.19X90.0M (BLADE) IMPLANT
BLADE SAW SGTL 13.0X1.19X90.0M (BLADE) ×2 IMPLANT
BLADE SURG SZ10 CARB STEEL (BLADE) ×4 IMPLANT
BNDG ELASTIC 6X5.8 VLCR STR LF (GAUZE/BANDAGES/DRESSINGS) ×2 IMPLANT
BOWL SMART MIX CTS (DISPOSABLE) ×2 IMPLANT
CEMENT HV SMART SET (Cement) ×4 IMPLANT
COVER WAND RF STERILE (DRAPES) IMPLANT
CUFF TOURN SGL QUICK 34 (TOURNIQUET CUFF) ×1
CUFF TRNQT CYL 34X4.125X (TOURNIQUET CUFF) ×1 IMPLANT
DECANTER SPIKE VIAL GLASS SM (MISCELLANEOUS) ×4 IMPLANT
DERMABOND ADVANCED (GAUZE/BANDAGES/DRESSINGS) ×1
DERMABOND ADVANCED .7 DNX12 (GAUZE/BANDAGES/DRESSINGS) ×1 IMPLANT
DRAPE U-SHAPE 47X51 STRL (DRAPES) ×2 IMPLANT
DRESSING AQUACEL AG SP 3.5X10 (GAUZE/BANDAGES/DRESSINGS) ×1 IMPLANT
DRSG AQUACEL AG SP 3.5X10 (GAUZE/BANDAGES/DRESSINGS) ×2
DURAPREP 26ML APPLICATOR (WOUND CARE) ×4 IMPLANT
ELECT REM PT RETURN 15FT ADLT (MISCELLANEOUS) ×2 IMPLANT
GLOVE ECLIPSE 8.0 STRL XLNG CF (GLOVE) IMPLANT
GLOVE ORTHO TXT STRL SZ7.5 (GLOVE) ×2 IMPLANT
GLOVE SURG ENC MOIS LTX SZ6 (GLOVE) IMPLANT
GLOVE SURG UNDER POLY LF SZ6.5 (GLOVE) ×2 IMPLANT
GLOVE SURG UNDER POLY LF SZ7.5 (GLOVE) ×2 IMPLANT
GLOVE SURG UNDER POLY LF SZ8.5 (GLOVE) IMPLANT
GOWN STRL REUS W/TWL 2XL LVL3 (GOWN DISPOSABLE) IMPLANT
GOWN STRL REUS W/TWL LRG LVL3 (GOWN DISPOSABLE) ×2 IMPLANT
HANDPIECE INTERPULSE COAX TIP (DISPOSABLE) ×1
HOLDER FOLEY CATH W/STRAP (MISCELLANEOUS) IMPLANT
KIT TURNOVER KIT A (KITS) ×2 IMPLANT
MANIFOLD NEPTUNE II (INSTRUMENTS) ×2 IMPLANT
NDL SAFETY ECLIPSE 18X1.5 (NEEDLE) IMPLANT
NEEDLE HYPO 18GX1.5 SHARP (NEEDLE)
NS IRRIG 1000ML POUR BTL (IV SOLUTION) ×2 IMPLANT
PACK TOTAL KNEE CUSTOM (KITS) ×2 IMPLANT
PENCIL SMOKE EVACUATOR (MISCELLANEOUS) IMPLANT
PIN DRILL FIX HALF THREAD (BIT) ×2 IMPLANT
PIN FIX SIGMA LCS THRD HI (PIN) ×2 IMPLANT
PROTECTOR NERVE ULNAR (MISCELLANEOUS) IMPLANT
SET HNDPC FAN SPRY TIP SCT (DISPOSABLE) ×1 IMPLANT
SET PAD KNEE POSITIONER (MISCELLANEOUS) ×2 IMPLANT
SUT MNCRL AB 4-0 PS2 18 (SUTURE) ×2 IMPLANT
SUT STRATAFIX PDS+ 0 24IN (SUTURE) ×2 IMPLANT
SUT VIC AB 1 CT1 36 (SUTURE) ×2 IMPLANT
SUT VIC AB 2-0 CT1 27 (SUTURE) ×3
SUT VIC AB 2-0 CT1 TAPERPNT 27 (SUTURE) ×3 IMPLANT
SYR 3ML LL SCALE MARK (SYRINGE) ×2 IMPLANT
TIBIAL BASE ROT PLAT SZ 7 KNEE (Knees) ×2 IMPLANT
WATER STERILE IRR 1000ML POUR (IV SOLUTION) ×4 IMPLANT
WRAP KNEE MAXI GEL POST OP (GAUZE/BANDAGES/DRESSINGS) ×2 IMPLANT

## 2020-10-26 NOTE — Anesthesia Postprocedure Evaluation (Signed)
Anesthesia Post Note  Patient: Kevin Olson  Procedure(s) Performed: TOTAL KNEE ARTHROPLASTY (Left Knee)     Patient location during evaluation: PACU Anesthesia Type: Regional and General Level of consciousness: awake Pain management: pain level controlled Vital Signs Assessment: post-procedure vital signs reviewed and stable Respiratory status: spontaneous breathing, nonlabored ventilation, respiratory function stable and patient connected to nasal cannula oxygen Cardiovascular status: blood pressure returned to baseline and stable Postop Assessment: no apparent nausea or vomiting Anesthetic complications: no   No complications documented.  Last Vitals:  Vitals:   10/26/20 1300 10/26/20 1400  BP: (!) 124/51   Pulse: 86   Resp: 16 14  Temp:    SpO2: 92%     Last Pain:  Vitals:   10/26/20 1400  PainSc: 2                  Manvir Thorson P Kimyah Frein

## 2020-10-26 NOTE — Interval H&P Note (Signed)
History and Physical Interval Note:  10/26/2020 8:43 AM  Kevin Olson  has presented today for surgery, with the diagnosis of Left knee osteoarthritis.  The various methods of treatment have been discussed with the patient and family. After consideration of risks, benefits and other options for treatment, the patient has consented to  Procedure(s) with comments: TOTAL KNEE ARTHROPLASTY (Left) - 70 mins as a surgical intervention.  The patient's history has been reviewed, patient examined, no change in status, stable for surgery.  I have reviewed the patient's chart and labs.  Questions were answered to the patient's satisfaction.     Mauri Pole

## 2020-10-26 NOTE — Op Note (Signed)
NAME:  Kevin Olson                      MEDICAL RECORD NO.:  [id number]                             FACILITY:  [facility]      PHYSICIAN:  Pietro Cassis. Alvan Dame, M.D.  DATE OF BIRTH:  Feb 26, 1946      DATE OF PROCEDURE:  10/26/2020                                     OPERATIVE REPORT         PREOPERATIVE DIAGNOSIS:  Left knee osteoarthritis.      POSTOPERATIVE DIAGNOSIS:  Left knee osteoarthritis.      FINDINGS:  The patient was noted to have complete loss of cartilage and   bone-on-bone arthritis with associated osteophytes in the medial and patellofemoral compartments of   the knee with a significant synovitis and associated effusion.  The patient had failed months of conservative treatment including medications, injection therapy, activity modification.     PROCEDURE:  Left total knee replacement.      COMPONENTS USED:  DePuy Attune rotating platform posterior stabilized knee   system, a size 7 femur, 7 tibia, size 5 mm PS AOX insert, and 41 anatomic patellar   button.      SURGEON:  Pietro Cassis. Alvan Dame, M.D.      ASSISTANT:  Griffith Citron, PA-C.      ANESTHESIA:  General and Regional.      SPECIMENS:  None.      COMPLICATION:  None.      DRAINS:  None.  EBL: <100 cc      TOURNIQUET TIME:   Total Tourniquet Time Documented: Thigh (Left) - 36 minutes Total: Thigh (Left) - 36 minutes  .      The patient was stable to the recovery room.      INDICATION FOR PROCEDURE:  Kevin Olson is a 75 y.o. male patient of   mine.  The patient had been seen, evaluated, and treated for months conservatively in the   office with medication, activity modification, and injections.  The patient had   radiographic changes of bone-on-bone arthritis with endplate sclerosis and osteophytes noted.  Based on the radiographic changes and failed conservative measures, the patient   decided to proceed with definitive treatment, total knee replacement.  Risks of infection, DVT, component failure, need for  revision surgery, neurovascular injury were reviewed in the office setting.  The postop course was reviewed stressing the efforts to maximize post-operative satisfaction and function.  Consent was obtained for benefit of pain   relief.      PROCEDURE IN DETAIL:  The patient was brought to the operative theater.   Once adequate anesthesia, preoperative antibiotics, 2 gm of Ancef,1 gm of Tranexamic Acid, and 10 mg of Decadron administered, the patient was positioned supine with a left thigh tourniquet placed.  The  left lower extremity was prepped and draped in sterile fashion.  A time-   out was performed identifying the patient, planned procedure, and the appropriate extremity.      The left lower extremity was placed in the [facility] leg holder.  The leg was   exsanguinated, tourniquet elevated to 250 mmHg.  A midline incision was   made followed  by median parapatellar arthrotomy.  Following initial   exposure, attention was first directed to the patella.  Precut   measurement was noted to be 26 mm.  I resected down to 14 mm and used a   41 anatomic patellar button to restore patellar height as well as cover the cut surface.      The lug holes were drilled and a metal shim was placed to protect the   patella from retractors and saw blade during the procedure.      At this point, attention was now directed to the femur.  The femoral   canal was opened with a drill, irrigated to try to prevent fat emboli.  An   intramedullary rod was passed at 5 degrees valgus, 10 mm of bone was   resected off the distal femur.  Following this resection, the tibia was   subluxated anteriorly.  Using the extramedullary guide, 2 mm of bone was resected off   the proximal mdial tibia.  We confirmed the gap would be   stable medially and laterally with a size 5 spacer block as well as confirmed that the tibial cut was perpendicular in the coronal plane, checking with an alignment rod.      Once this was done, I  sized the femur to be a size 7 in the anterior-   posterior dimension, chose a standard component based on medial and   lateral dimension.  The size 7 rotation block was then pinned in   position anterior referenced using the C-clamp to set rotation.  The   anterior, posterior, and  chamfer cuts were made without difficulty nor   notching making certain that I was along the anterior cortex to help   with flexion gap stability.      The final box cut was made off the lateral aspect of distal femur.      At this point, the tibia was sized to be a size 7.  The size 7 tray was   then pinned in position through the medial third of the tubercle,   drilled, and keel punched.  Trial reduction was now carried with a 7 femur,  7 tibia, a size 5 mm PS insert, and the 41 anatomic patella botton.  The knee was brought to full extension with good flexion stability with the patella   tracking through the trochlea without application of pressure.  Given   all these findings the trial components removed.  Final components were   opened and cement was mixed.  The knee was irrigated with normal saline solution and pulse lavage.  The synovial lining was   then injected with 30 cc of 0.25% Marcaine with epinephrine, 1 cc of Toradol and 30 cc of NS for a total of 61 cc.     Final implants were then cemented onto cleaned and dried cut surfaces of bone with the knee brought to extension with a size 5 mm PS trial insert.      Once the cement had fully cured, excess cement was removed   throughout the knee.  I confirmed that I was satisfied with the range of   motion and stability, and the final size 5 mm PS AOX insert was chosen.  It was   placed into the knee.      The tourniquet had been let down at 36 minutes.  No significant   hemostasis was required.  The extensor mechanism was then reapproximated using #1 Vicryl and #1  Stratafix sutures with the knee   in flexion.  The   remaining wound was closed with 2-0  Vicryl and running 4-0 Monocryl.   The knee was cleaned, dried, dressed sterilely using Dermabond and   Aquacel dressing.  The patient was then   brought to recovery room in stable condition, tolerating the procedure   well.   Please note that Physician Assistant, Griffith Citron, PA-C was present for the entirety of the case, and was utilized for pre-operative positioning, peri-operative retractor management, general facilitation of the procedure and for primary wound closure at the end of the case.              Pietro Cassis Alvan Dame, M.D.    10/26/2020 11:15 AM

## 2020-10-26 NOTE — Anesthesia Procedure Notes (Signed)
Anesthesia Regional Block: Adductor canal block   Pre-Anesthetic Checklist: ,, timeout performed, Correct Patient, Correct Site, Correct Laterality, Correct Procedure,, site marked, risks and benefits discussed, Surgical consent,  Pre-op evaluation,  At surgeon's request and post-op pain management  Laterality: Left  Prep: chloraprep       Needles:  Injection technique: Single-shot  Needle Type: Echogenic Stimulator Needle     Needle Length: 10cm  Needle Gauge: 20     Additional Needles:   Procedures:,,,, ultrasound used (permanent image in chart),,,,  Narrative:  Start time: 10/26/2020 8:55 AM End time: 10/26/2020 9:05 AM Injection made incrementally with aspirations every 5 mL.  Performed by: Personally  Anesthesiologist: Murvin Natal, MD  Additional Notes: Functioning IV was confirmed and monitors were applied. A time-out was performed. Hand hygiene and sterile gloves were used. The thigh was placed in a frog-leg position and prepped in a sterile fashion. A 132mm 20ga BBraun echogenic stimulator needle was placed using ultrasound guidance.  Negative aspiration and negative test dose prior to incremental administration of local anesthetic. The patient tolerated the procedure well.

## 2020-10-26 NOTE — Anesthesia Procedure Notes (Addendum)
Procedure Name: LMA Insertion Date/Time: 10/26/2020 9:54 AM Performed by: [place] Tomika Eckles C, CRNA Pre-anesthesia Checklist: Patient identified, Emergency Drugs available, Suction available and Patient being monitored Patient Re-evaluated:Patient Re-evaluated prior to induction Oxygen Delivery Method: Circle system utilized Preoxygenation: Pre-oxygenation with 100% oxygen Induction Type: IV induction Ventilation: Mask ventilation without difficulty LMA: LMA inserted LMA Size: 4.0 Number of attempts: 1 Airway Equipment and Method: Bite block Placement Confirmation: positive ETCO2 Tube secured with: Tape Dental Injury: Teeth and Oropharynx as per pre-operative assessment

## 2020-10-26 NOTE — Evaluation (Signed)
Physical Therapy Evaluation Patient Details Name: Kevin Olson MRN: [id number] DOB: 03/27/1946 Today's Date: 10/26/2020   History of Present Illness  s/p L TKA.  Clinical Impression  Patient evaluated by Physical Therapy with no further acute PT needs identified. All education has been completed and the patient has no further questions.  See below for mobility details. Pt doing well. Reviewed TKA HEP. Pt is ready to d/c with wife's assist   See below for any follow-up Physical Therapy or equipment needs. PT is signing off. Thank you for this referral.     Follow Up Recommendations Follow surgeon's recommendation for DC plan and follow-up therapies    Equipment Recommendations  Rolling walker with 5" wheels    Recommendations for Other Services       Precautions / Restrictions Precautions Precautions: Fall;Knee Restrictions Weight Bearing Restrictions: No Other Position/Activity Restrictions: WBAT      Mobility  Bed Mobility Overal bed mobility: Needs Assistance Bed Mobility: Supine to Sit     Supine to sit: Supervision     General bed mobility comments: for safety, no physical assist    Transfers Overall transfer level: Needs assistance Equipment used: Rolling walker (2 wheeled) Transfers: Sit to/from Stand Sit to Stand: Min guard         General transfer comment: cues for hand placement  Ambulation/Gait Ambulation/Gait assistance: Supervision;Min guard Gait Distance (Feet): 200 Feet Assistive device: Rolling walker (2 wheeled) Gait Pattern/deviations: Step-to pattern;Decreased stance time - left     General Gait Details: cues for sequence and RW position. no LOB, steady with RW support  Stairs Stairs: Yes Stairs assistance: Min guard Stair Management: Two rails;Step to pattern;Forwards Number of Stairs: 5 (x2) General stair comments: cues for sequence, min/guard for safety  Wheelchair Mobility    Modified Rankin (Stroke Patients Only)        Balance                                             Pertinent Vitals/Pain Pain Assessment: 0-10 Pain Score: 5  Pain Location: L knee Pain Descriptors / Indicators: Grimacing;Sore Pain Intervention(s): Limited activity within patient's tolerance;Monitored during session;Premedicated before session;Repositioned    Home Living Family/patient expects to be discharged to:: Private residence Living Arrangements: Spouse/significant other Available Help at Discharge: Family Type of Home: House Home Access: Stairs to enter Entrance Stairs-Rails: Right;Left;Can reach both Technical brewer of Steps: 4 Home Layout: One level Home Equipment: None      Prior Function Level of Independence: Independent               Hand Dominance        Extremity/Trunk Assessment   Upper Extremity Assessment Upper Extremity Assessment: Overall WFL for tasks assessed    Lower Extremity Assessment Lower Extremity Assessment: LLE deficits/detail LLE Deficits / Details: ankle WFL, knee extension and hip flexion 2+/5; knee AAROM grossly 8 to 70 degrees       Communication   Communication: No difficulties  Cognition Arousal/Alertness: Awake/alert Behavior During Therapy: WFL for tasks assessed/performed Overall Cognitive Status: Within Functional Limits for tasks assessed                                        General Comments      Exercises Total Joint  Exercises Ankle Circles/Pumps: AROM;10 reps Quad Sets: AROM;5 reps;Limitations Quad Sets Limitations: pain Heel Slides: AROM;Left;10 reps;AAROM Straight Leg Raises: AROM;Left;10 reps   Assessment/Plan    PT Assessment All further PT needs can be met in the next venue of care  PT Problem List         PT Treatment Interventions      PT Goals (Current goals can be found in the Care Plan section)  Acute Rehab PT Goals Patient Stated Goal: home today. less knee pain with walking PT Goal  Formulation: All assessment and education complete, DC therapy    Frequency     Barriers to discharge        Co-evaluation               AM-PAC PT "6 Clicks" Mobility  Outcome Measure Help needed turning from your back to your side while in a flat bed without using bedrails?: None Help needed moving from lying on your back to sitting on the side of a flat bed without using bedrails?: None Help needed moving to and from a bed to a chair (including a wheelchair)?: A Little Help needed standing up from a chair using your arms (e.g., wheelchair or bedside chair)?: A Little Help needed to walk in hospital room?: A Little Help needed climbing 3-5 steps with a railing? : A Little 6 Click Score: 20    End of Session Equipment Utilized During Treatment: Gait belt Activity Tolerance: Patient tolerated treatment well Patient left: Other (comment) (Edge of stretcher, RN present)   PT Visit Diagnosis: Other abnormalities of gait and mobility (R26.89)    Time: 5284-1324 PT Time Calculation (min) (ACUTE ONLY): 24 min   Charges:   PT Evaluation $PT Eval Low Complexity: 1 Low PT Treatments $Gait Training: 8-22 mins        Baxter Flattery, PT  Acute Rehab Dept ([place]) [phone] Pager [phone]  10/26/2020   [facility] 10/26/2020, 4:54 PM

## 2020-10-26 NOTE — Transfer of Care (Signed)
Immediate Anesthesia Transfer of Care Note  Patient: Kevin Olson  Procedure(s) Performed: TOTAL KNEE ARTHROPLASTY (Left Knee)  Patient Location: PACU  Anesthesia Type:General  Level of Consciousness: drowsy  Airway & Oxygen Therapy: Patient Spontanous Breathing and Patient connected to face mask oxygen  Post-op Assessment: Report given to RN and Post -op Vital signs reviewed and stable  Post vital signs: Reviewed and stable  Last Vitals:  Vitals Value Taken Time  BP 149/62 10/26/20 1138  Temp    Pulse 86 10/26/20 1141  Resp 11 10/26/20 1141  SpO2 100 % 10/26/20 1141  Vitals shown include unvalidated device data.  Last Pain:  Vitals:   10/26/20 0906  PainSc: 0-No pain         Complications: No complications documented.

## 2020-10-26 NOTE — Discharge Instructions (Signed)

## 2020-10-26 NOTE — Anesthesia Preprocedure Evaluation (Addendum)
Anesthesia Evaluation  Patient identified by MRN, date of birth, ID band Patient awake    Reviewed: Allergy & Precautions, NPO status , Patient's Chart, lab work & pertinent test results  Airway Mallampati: III  TM Distance: >3 FB Neck ROM: Full    Dental no notable dental hx.    Pulmonary former smoker,    Pulmonary exam normal breath sounds clear to auscultation       Cardiovascular hypertension, Pt. on medications Normal cardiovascular exam Rhythm:Regular Rate:Normal  ECG: NSR, rate 64  Myocardial perfusion: Nuclear stress EF: 62%. There were no wall motion abnormalities There was no ST segment deviation noted during stress. This is a low risk study. There are no significant perfusion defects identified. There is no LAD territory ischemia. (LAD plaque noted on recent CT of coronary arteries) The study is normal.     Neuro/Psych PSYCHIATRIC DISORDERS Depression negative neurological ROS     GI/Hepatic negative GI ROS, Neg liver ROS,   Endo/Other  diabetes, Oral Hypoglycemic AgentsHypothyroidism   Renal/GU negative Renal ROS     Musculoskeletal  (+) Arthritis ,   Abdominal (+) + obese,   Peds  Hematology  (+) anemia , HLD   Anesthesia Other Findings Left knee osteoarthritis  Reproductive/Obstetrics                            Anesthesia Physical Anesthesia Plan  ASA: III  Anesthesia Plan: General and Regional   Post-op Pain Management: GA combined w/ Regional for post-op pain   Induction: Intravenous  PONV Risk Score and Plan: 2 and Ondansetron, Dexamethasone and Treatment may vary due to age or medical condition  Airway Management Planned: LMA  Additional Equipment:   Intra-op Plan:   Post-operative Plan: Extubation in OR  Informed Consent: I have reviewed the patients History and Physical, chart, labs and discussed the procedure including the risks, benefits and  alternatives for the proposed anesthesia with the patient or authorized representative who has indicated his/her understanding and acceptance.     Dental advisory given  Plan Discussed with: CRNA  Anesthesia Plan Comments:         Anesthesia Quick Evaluation

## 2020-10-26 NOTE — Progress Notes (Signed)
Assisted Dr. Ellender with left, ultrasound guided, adductor canal block. Side rails up, monitors on throughout procedure. See vital signs in flow sheet. Tolerated Procedure well.  

## 2020-10-26 NOTE — Care Plan (Signed)
Ortho Bundle Case Management Note  Patient Details  Name: Eddrick Dilone MRN: [id number] Date of Birth: 04-07-1946  L TKA on 10-26-20 DCP:  Home with wife.  1 story home with 3 ste. DME:  RW ordered through [place].  Has a 3-in-1. PT:  [facility].  PT eval scheduled on 10-28-20.                   DME Arranged:  Gilford Rile rolling DME Agency:  [employer]  HH Arranged:  NA [place] Agency:  NA  Additional Comments: Please contact me with any questions of if this plan should need to change.  Marianne Sofia, RN,CCM [facility]  [phone] 10/26/2020, 9:51 AM

## 2020-10-27 ENCOUNTER — Encounter (HOSPITAL_COMMUNITY): Payer: Self-pay | Admitting: Orthopedic Surgery

## 2020-10-28 DIAGNOSIS — M25562 Pain in left knee: Secondary | ICD-10-CM | POA: Diagnosis not present

## 2020-10-28 DIAGNOSIS — M48061 Spinal stenosis, lumbar region without neurogenic claudication: Secondary | ICD-10-CM | POA: Diagnosis not present

## 2020-11-03 DIAGNOSIS — M7062 Trochanteric bursitis, left hip: Secondary | ICD-10-CM | POA: Diagnosis not present

## 2020-11-03 DIAGNOSIS — Z471 Aftercare following joint replacement surgery: Secondary | ICD-10-CM | POA: Diagnosis not present

## 2020-11-03 DIAGNOSIS — Z96652 Presence of left artificial knee joint: Secondary | ICD-10-CM | POA: Diagnosis not present

## 2020-11-04 DIAGNOSIS — M25562 Pain in left knee: Secondary | ICD-10-CM | POA: Diagnosis not present

## 2020-11-09 DIAGNOSIS — M25562 Pain in left knee: Secondary | ICD-10-CM | POA: Diagnosis not present

## 2020-11-11 DIAGNOSIS — M25562 Pain in left knee: Secondary | ICD-10-CM | POA: Diagnosis not present

## 2020-11-16 DIAGNOSIS — M25562 Pain in left knee: Secondary | ICD-10-CM | POA: Diagnosis not present

## 2020-11-18 DIAGNOSIS — M25562 Pain in left knee: Secondary | ICD-10-CM | POA: Diagnosis not present

## 2020-11-22 DIAGNOSIS — M25562 Pain in left knee: Secondary | ICD-10-CM | POA: Diagnosis not present

## 2020-11-26 DIAGNOSIS — M25562 Pain in left knee: Secondary | ICD-10-CM | POA: Diagnosis not present

## 2020-11-29 DIAGNOSIS — M25562 Pain in left knee: Secondary | ICD-10-CM | POA: Diagnosis not present

## 2020-12-02 DIAGNOSIS — M25562 Pain in left knee: Secondary | ICD-10-CM | POA: Diagnosis not present

## 2020-12-10 DIAGNOSIS — Z96652 Presence of left artificial knee joint: Secondary | ICD-10-CM | POA: Diagnosis not present

## 2020-12-10 DIAGNOSIS — Z471 Aftercare following joint replacement surgery: Secondary | ICD-10-CM | POA: Diagnosis not present

## 2020-12-14 NOTE — Patient Instructions (Addendum)
DUE TO COVID-19 ONLY ONE VISITOR IS ALLOWED TO COME WITH YOU AND STAY IN THE WAITING ROOM ONLY DURING PRE OP AND PROCEDURE DAY OF SURGERY. THE 1 VISITOR  MAY VISIT WITH YOU AFTER SURGERY IN YOUR PRIVATE ROOM DURING VISITING HOURS ONLY!  YOU NEED TO HAVE A COVID 19 TEST ON: 12/17/20@ 1:00 PM, THIS TEST MUST BE DONE BEFORE SURGERY,  COVID TESTING SITE [place] [address], IT IS ON THE RIGHT GOING OUT [address] APPROXIMATELY  2 MINUTES PAST [employer] ON THE RIGHT. ONCE YOUR COVID TEST IS COMPLETED,  PLEASE BEGIN THE QUARANTINE INSTRUCTIONS AS OUTLINED IN YOUR HANDOUT.                Kevin Olson   Your procedure is scheduled on: 12/21/20   Report to [facility] Main  Entrance   Report to admitting at: 7:30 AM     Call this number if you have problems the morning of surgery [phone]    Remember:  NO SOLID FOOD AFTER MIDNIGHT THE NIGHT PRIOR TO SURGERY. NOTHING BY MOUTH EXCEPT CLEAR LIQUIDS UNTIL . PLEASE FINISH GATORADE DRINK PER SURGEON ORDER  WHICH NEEDS TO BE COMPLETED AT: 7:00 AM .  CLEAR LIQUID DIET  Foods Allowed                                                                     Foods Excluded  Coffee and tea, regular and decaf                             liquids that you cannot  Plain Jell-O any favor except red or purple                                           see through such as: Fruit ices (not with fruit pulp)                                     milk, soups, orange juice  Iced Popsicles                                    All solid food Carbonated beverages, regular and diet                                    Cranberry, grape and apple juices Sports drinks like Gatorade Lightly seasoned clear broth or consume(fat free) Sugar, honey syrup  Sample Menu Breakfast                                Lunch  Supper Cranberry juice                    Beef broth                            Chicken  broth Jell-O                                     Grape juice                           Apple juice Coffee or tea                        Jell-O                                      Popsicle                                                Coffee or tea                        Coffee or tea  _____________________________________________________________________   BRUSH YOUR TEETH MORNING OF SURGERY AND RINSE YOUR MOUTH OUT, NO CHEWING GUM CANDY OR MINTS.    Take these medicines the morning of surgery with A SIP OF WATER: levothyroxine,tamsulosin.  How to Manage Your Diabetes Before and After Surgery  Why is it important to control my blood sugar before and after surgery? . Improving blood sugar levels before and after surgery helps healing and can limit problems. . A way of improving blood sugar control is eating a healthy diet by: o  Eating less sugar and carbohydrates o  Increasing activity/exercise o  Talking with your doctor about reaching your blood sugar goals . High blood sugars (greater than 180 mg/dL) can raise your risk of infections and slow your recovery, so you will need to focus on controlling your diabetes during the weeks before surgery. . Make sure that the doctor who takes care of your diabetes knows about your planned surgery including the date and location.  How do I manage my blood sugar before surgery? . Check your blood sugar at least 4 times a day, starting 2 days before surgery, to make sure that the level is not too high or low. o Check your blood sugar the morning of your surgery when you wake up and every 2 hours until you get to the Short Stay unit. . If your blood sugar is less than 70 mg/dL, you will need to treat for low blood sugar: o Do not take insulin. o Treat a low blood sugar (less than 70 mg/dL) with  cup of clear juice (cranberry or apple), 4 glucose tablets, OR glucose gel. o Recheck blood sugar in 15 minutes after treatment (to make sure it is greater  than 70 mg/dL). If your blood sugar is not greater than 70 mg/dL on recheck, call [phone] for further instructions. . Report your blood sugar to the short stay nurse when you get to Short Stay.  . If you are  admitted to the hospital after surgery: o Your blood sugar will be checked by the staff and you will probably be given insulin after surgery (instead of oral diabetes medicines) to make sure you have good blood sugar levels. o The goal for blood sugar control after surgery is 80-180 mg/dL.   WHAT DO I DO ABOUT MY DIABETES MEDICATION?  [name] Kitchen Do not take oral diabetes medicines (pills) the morning of surgery.  . THE DAY BEFORE SURGERY, take Metformin,Actos,Januvia and Glimepiride as usual.       THE MORNING OF SURGERY, DO NOT TAKE ANY DIABETIC MEDICATIONS DAY OF YOUR SURGERY                               You may not have any metal on your body including hair pins and              piercings  Do not wear jewelry, lotions, powders or perfumes, deodorant             Men may shave face and neck.   Do not bring valuables to the hospital. [place].  Contacts, dentures or bridgework may not be worn into surgery.  Leave suitcase in the car. After surgery it may be brought to your room.     Patients discharged the day of surgery will not be allowed to drive home. IF YOU ARE HAVING SURGERY AND GOING HOME THE SAME DAY, YOU MUST HAVE AN ADULT TO DRIVE YOU HOME AND BE WITH YOU FOR 24 HOURS. YOU MAY GO HOME BY TAXI OR UBER OR ORTHERWISE, BUT AN ADULT MUST ACCOMPANY YOU HOME AND STAY WITH YOU FOR 24 HOURS.  Name and phone number of your driver:  Special Instructions: N/A              Please read over the following fact sheets you were given: _____________________________________________________________________          [facility] - Preparing for Surgery Before surgery, you can play an important role.  Because skin is not sterile, your skin  needs to be as free of germs as possible.  You can reduce the number of germs on your skin by washing with CHG (chlorahexidine gluconate) soap before surgery.  CHG is an antiseptic cleaner which kills germs and bonds with the skin to continue killing germs even after washing. Please DO NOT use if you have an allergy to CHG or antibacterial soaps.  If your skin becomes reddened/irritated stop using the CHG and inform your nurse when you arrive at Short Stay. Do not shave (including legs and underarms) for at least 48 hours prior to the first CHG shower.  You may shave your face/neck. Please follow these instructions carefully:  1.  Shower with CHG Soap the night before surgery and the  morning of Surgery.  2.  If you choose to wash your hair, wash your hair first as usual with your  normal  shampoo.  3.  After you shampoo, rinse your hair and body thoroughly to remove the  shampoo.                           4.  Use CHG as you would any other liquid soap.  You can apply chg directly  to the skin and  wash                       Gently with a scrungie or clean washcloth.  5.  Apply the CHG Soap to your body ONLY FROM THE NECK DOWN.   Do not use on face/ open                           Wound or open sores. Avoid contact with eyes, ears mouth and genitals (private parts).                       Wash face,  Genitals (private parts) with your normal soap.             6.  Wash thoroughly, paying special attention to the area where your surgery  will be performed.  7.  Thoroughly rinse your body with warm water from the neck down.  8.  DO NOT shower/wash with your normal soap after using and rinsing off  the CHG Soap.                9.  Pat yourself dry with a clean towel.            10.  Wear clean pajamas.            11.  Place clean sheets on your bed the night of your first shower and do not  sleep with pets. Day of Surgery : Do not apply any lotions/deodorants the morning of surgery.  Please wear clean  clothes to the hospital/surgery center.  FAILURE TO FOLLOW THESE INSTRUCTIONS MAY RESULT IN THE CANCELLATION OF YOUR SURGERY PATIENT SIGNATURE_________________________________  NURSE SIGNATURE__________________________________  ________________________________________________________________________   Kevin Olson  An incentive spirometer is a tool that can help keep your lungs clear and active. This tool measures how well you are filling your lungs with each breath. Taking long deep breaths may help reverse or decrease the chance of developing breathing (pulmonary) problems (especially infection) following:  A long period of time when you are unable to move or be active. BEFORE THE PROCEDURE   If the spirometer includes an indicator to show your best effort, your nurse or respiratory therapist will set it to a desired goal.  If possible, sit up straight or lean slightly forward. Try not to slouch.  Hold the incentive spirometer in an upright position. INSTRUCTIONS FOR USE  1. Sit on the edge of your bed if possible, or sit up as far as you can in bed or on a chair. 2. Hold the incentive spirometer in an upright position. 3. Breathe out normally. 4. Place the mouthpiece in your mouth and seal your lips tightly around it. 5. Breathe in slowly and as deeply as possible, raising the piston or the ball toward the top of the column. 6. Hold your breath for 3-5 seconds or for as long as possible. Allow the piston or ball to fall to the bottom of the column. 7. Remove the mouthpiece from your mouth and breathe out normally. 8. Rest for a few seconds and repeat Steps 1 through 7 at least 10 times every 1-2 hours when you are awake. Take your time and take a few normal breaths between deep breaths. 9. The spirometer may include an indicator to show your best effort. Use the indicator as a goal to work toward during each repetition. 10. After each set of 10 deep  breaths, practice  coughing to be sure your lungs are clear. If you have an incision (the cut made at the time of surgery), support your incision when coughing by placing a pillow or rolled up towels firmly against it. Once you are able to get out of bed, walk around indoors and cough well. You may stop using the incentive spirometer when instructed by your caregiver.  RISKS AND COMPLICATIONS  Take your time so you do not get dizzy or light-headed.  If you are in pain, you may need to take or ask for pain medication before doing incentive spirometry. It is harder to take a deep breath if you are having pain. AFTER USE  Rest and breathe slowly and easily.  It can be helpful to keep track of a log of your progress. Your caregiver can provide you with a simple table to help with this. If you are using the spirometer at home, follow these instructions: [place] IF:   You are having difficultly using the spirometer.  You have trouble using the spirometer as often as instructed.  Your pain medication is not giving enough relief while using the spirometer.  You develop fever of 100.5 F (38.1 C) or higher. SEEK IMMEDIATE MEDICAL CARE IF:   You cough up bloody sputum that had not been present before.  You develop fever of 102 F (38.9 C) or greater.  You develop worsening pain at or near the incision site. MAKE SURE YOU:   Understand these instructions.  Will watch your condition.  Will get help right away if you are not doing well or get worse. Document Released: 01/15/2007 Document Revised: 11/27/2011 Document Reviewed: 03/18/2007 [facility] Patient Information 2014 [place], [place].   ________________________________________________________________________

## 2020-12-15 ENCOUNTER — Encounter (HOSPITAL_COMMUNITY): Payer: Self-pay

## 2020-12-15 ENCOUNTER — Encounter (HOSPITAL_COMMUNITY)
Admission: RE | Admit: 2020-12-15 | Discharge: 2020-12-15 | Disposition: A | Discharge status: Home / Self Care | Payer: Medicare HMO | Source: Ambulatory Visit | Attending: Orthopedic Surgery | Admitting: Orthopedic Surgery

## 2020-12-15 ENCOUNTER — Other Ambulatory Visit: Payer: Self-pay

## 2020-12-15 DIAGNOSIS — Z01812 Encounter for preprocedural laboratory examination: Secondary | ICD-10-CM | POA: Insufficient documentation

## 2020-12-15 DIAGNOSIS — E782 Mixed hyperlipidemia: Secondary | ICD-10-CM | POA: Diagnosis not present

## 2020-12-15 DIAGNOSIS — E1142 Type 2 diabetes mellitus with diabetic polyneuropathy: Secondary | ICD-10-CM | POA: Diagnosis not present

## 2020-12-15 DIAGNOSIS — I10 Essential (primary) hypertension: Secondary | ICD-10-CM | POA: Diagnosis not present

## 2020-12-15 DIAGNOSIS — E039 Hypothyroidism, unspecified: Secondary | ICD-10-CM | POA: Diagnosis not present

## 2020-12-15 DIAGNOSIS — M1990 Unspecified osteoarthritis, unspecified site: Secondary | ICD-10-CM | POA: Diagnosis not present

## 2020-12-15 DIAGNOSIS — E119 Type 2 diabetes mellitus without complications: Secondary | ICD-10-CM | POA: Diagnosis not present

## 2020-12-15 DIAGNOSIS — E1140 Type 2 diabetes mellitus with diabetic neuropathy, unspecified: Secondary | ICD-10-CM | POA: Diagnosis not present

## 2020-12-15 LAB — CBC
HCT: 33.8 % — ABNORMAL LOW (ref 39.0–52.0)
Hemoglobin: 10.3 g/dL — ABNORMAL LOW (ref 13.0–17.0)
MCH: 26.2 pg (ref 26.0–34.0)
MCHC: 30.5 g/dL (ref 30.0–36.0)
MCV: 86 fL (ref 80.0–100.0)
Platelets: 398 10*3/uL (ref 150–400)
RBC: 3.93 MIL/uL — ABNORMAL LOW (ref 4.22–5.81)
RDW: 16.3 % — ABNORMAL HIGH (ref 11.5–15.5)
WBC: 4.8 10*3/uL (ref 4.0–10.5)
nRBC: 0 % (ref 0.0–0.2)

## 2020-12-15 LAB — BASIC METABOLIC PANEL
Anion gap: 8 (ref 5–15)
BUN: 27 mg/dL — ABNORMAL HIGH (ref 8–23)
CO2: 25 mmol/L (ref 22–32)
Calcium: 9.9 mg/dL (ref 8.9–10.3)
Chloride: 108 mmol/L (ref 98–111)
Creatinine, Ser: 0.77 mg/dL (ref 0.61–1.24)
GFR, Estimated: >60 mL/min (ref >60)
Glucose, Bld: 114 mg/dL — ABNORMAL HIGH (ref 70–99)
Potassium: 4.7 mmol/L (ref 3.5–5.1)
Sodium: 141 mmol/L (ref 135–145)

## 2020-12-15 LAB — SURGICAL PCR SCREEN
MRSA, PCR: NEGATIVE
Staphylococcus aureus: NEGATIVE

## 2020-12-15 LAB — GLUCOSE, CAPILLARY: Glucose-Capillary: 159 mg/dL — ABNORMAL HIGH (ref 70–99)

## 2020-12-15 LAB — HEMOGLOBIN A1C
Hgb A1c MFr Bld: 6.4 % — ABNORMAL HIGH (ref 4.8–5.6)
Mean Plasma Glucose: 136.98 mg/dL

## 2020-12-15 NOTE — Progress Notes (Signed)
COVID Vaccine Completed: Yes Date COVID Vaccine completed: 11/21 [place] COVID vaccine manufacturer: [place]    PCP - Sammuel Hines: [facility] Cardiologist - Dr. Lyman Bishop  Chest x-ray -  EKG - 10/18/20 Stress Test -  ECHO -  Cardiac Cath -  Pacemaker/ICD device last checked: A1-C: 7.1:10/18/20 Sleep Study -  CPAP -   Fasting Blood Sugar - 140's Checks Blood Sugar __2___ times a week  Blood Thinner Instructions: Aspirin Instructions: Last Dose:  Anesthesia review: Hx: DIA,HTN  Patient denies shortness of breath, fever, cough and chest pain at PAT appointment   Patient verbalized understanding of instructions that were given to them at the PAT appointment. Patient was also instructed that they will need to review over the PAT instructions again at home before surgery.

## 2020-12-17 ENCOUNTER — Other Ambulatory Visit (HOSPITAL_COMMUNITY)
Admission: RE | Admit: 2020-12-17 | Discharge: 2020-12-17 | Disposition: A | Discharge status: Home / Self Care | Payer: Medicare HMO | Source: Ambulatory Visit | Attending: Orthopedic Surgery | Admitting: Orthopedic Surgery

## 2020-12-17 DIAGNOSIS — Z20822 Contact with and (suspected) exposure to covid-19: Secondary | ICD-10-CM | POA: Diagnosis not present

## 2020-12-17 DIAGNOSIS — Z01812 Encounter for preprocedural laboratory examination: Secondary | ICD-10-CM | POA: Diagnosis not present

## 2020-12-18 LAB — SARS CORONAVIRUS 2 (TAT 6-24 HRS): SARS Coronavirus 2: NEGATIVE

## 2020-12-20 NOTE — H&P (Signed)
TOTAL KNEE ADMISSION H&P  Patient is being admitted for right total knee arthroplasty.  Subjective:  Chief Complaint:right knee pain.  HPI: Kevin Olson, 75 y.o. male, has a history of pain and functional disability in the right knee due to arthritis and has failed non-surgical conservative treatments for greater than 12 weeks to includecorticosteriod injections and activity modification.  Onset of symptoms was gradual, starting 3 years ago with gradually worsening course since that time. The patient noted no past surgery on the right knee(s).  Patient currently rates pain in the right knee(s) at 7 out of 10 with activity. Patient has worsening of pain with activity and weight bearing and pain that interferes with activities of daily living.  Patient has evidence of joint space narrowing by imaging studies.There is no active infection.  Patient Active Problem List   Diagnosis Date Noted  . 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 Malignant neoplasm of prostate ([place]) 07/22/2019  . Osteoarthritis of left knee 10/02/2018  . Osteoarthritis of right knee 10/02/2018  . Pain in left knee 09/03/2018  . Pain in right knee 09/03/2018  . Lumbar pain 12/17/2017  . Spinal stenosis of lumbar region 12/17/2017   Past Medical History:  Diagnosis Date  . Depression   . DM2 (diabetes mellitus, type 2) ([place])   . Dyslipidemia   . Hypertension   . OA (osteoarthritis) of knee   . Prostate cancer [facility])    prostate    Past Surgical History:  Procedure Laterality Date  . INGUINAL HERNIA REPAIR    . PROSTATE BIOPSY    . TOTAL KNEE ARTHROPLASTY Left 10/26/2020   Procedure: TOTAL KNEE ARTHROPLASTY;  Surgeon: Paralee Cancel, MD;  Location: [facility] ORS;  Service: Orthopedics;  Laterality: Left;  70 mins    No current facility-administered medications for this encounter.   Current Outpatient Medications  Medication Sig Dispense Refill Last Dose  . ALPRAZolam (XANAX) 1 MG tablet Take 1 mg by mouth at  bedtime.     [name] Kitchen aspirin EC 81 MG tablet Take 81 mg by mouth daily. Swallow whole.     [name] Kitchen atorvastatin (LIPITOR) 10 MG tablet Take 10 mg by mouth every evening.     . baclofen (LIORESAL) 10 MG tablet Take 10 mg by mouth 3 (three) times daily.     . celecoxib (CELEBREX) 200 MG capsule Take 200 mg by mouth 2 (two) times daily.     . Cholecalciferol (VITAMIN D3) 50 MCG (2000 UT) TABS Take 2,000 Units by mouth every evening.     . Coenzyme Q10-Vitamin E (QUNOL ULTRA COQ10 PO) Take 1 capsule by mouth at bedtime.     . DULoxetine (CYMBALTA) 60 MG capsule Take 60 mg by mouth every evening.     . Evolocumab (REPATHA SURECLICK) 818 MG/ML SOAJ Inject 1 Dose into the skin every 14 (fourteen) days. (Patient taking differently: Inject 140 mg into the skin every 14 (fourteen) days.) 2 mL 6   . glimepiride (AMARYL) 4 MG tablet Take 4 mg by mouth daily with breakfast.     . levothyroxine (SYNTHROID) 100 MCG tablet Take 100 mcg by mouth daily before breakfast.     . losartan (COZAAR) 25 MG tablet Take 12.5 mg by mouth every evening.     . metFORMIN (GLUCOPHAGE) 1000 MG tablet Take 1,000 mg by mouth in the morning and at bedtime.     . methocarbamol (ROBAXIN) 500 MG tablet Take 1 tablet (500 mg total) by mouth every 6 (six)  hours as needed for muscle spasms. 40 tablet 0   . Multiple Vitamin (MULTIVITAMIN WITH MINERALS) TABS tablet Take 1 tablet by mouth daily. Centrum Silver     . pioglitazone (ACTOS) 15 MG tablet Take 15 mg by mouth daily.     . sitaGLIPtin (JANUVIA) 50 MG tablet Take 50 mg by mouth every evening.     . tamsulosin (FLOMAX) 0.4 MG CAPS capsule Take 0.8 mg by mouth daily.     . Turmeric Curcumin 500 MG CAPS Take 500 mg by mouth daily.     . Blood Glucose Monitoring Suppl (TRUE METRIX AIR GLUCOSE METER) DEVI True Metrix Air Glucose Meter kit     . docusate sodium (COLACE) 100 MG capsule Take 1 capsule (100 mg total) by mouth 2 (two) times daily. (Patient not taking: Reported on 12/03/2020) 28 capsule 0  Not Taking at Unknown time  . glucose blood (TRUE METRIX BLOOD GLUCOSE TEST) test strip True Metrix Glucose Test Strip     . HYDROcodone-acetaminophen (NORCO) 7.5-325 MG tablet Take 1-2 tablets by mouth every 4 (four) hours as needed for moderate pain. (Patient not taking: Reported on 12/03/2020) 60 tablet 0 Not Taking at Unknown time  . polyethylene glycol (MIRALAX / GLYCOLAX) 17 g packet Take 17 g by mouth 2 (two) times daily. (Patient not taking: Reported on 12/03/2020) 28 packet 0 Not Taking at Unknown time  . TRUEplus Lancets 33G MISC TRUEplus Lancets 33 gauge      No Known Allergies  Social History   Tobacco Use  . Smoking status: Former Smoker    Packs/day: 2.00    Years: 12.00    Pack years: 24.00    Types: Cigarettes    Quit date: 04/19/1979    Years since quitting: 41.7  . Smokeless tobacco: Never Used  Substance Use Topics  . Alcohol use: Yes    Comment: occas.    Family History  Problem Relation Age of Onset  . Pancreatic cancer Sister   . Other Sister        brain tumor  . CAD Mother   . CAD Brother        CABGx4   . CAD Brother        CABGx4  . Colon cancer Neg Hx   . Breast cancer Neg Hx      Review of Systems  Constitutional: Negative for chills and fever.  Respiratory: Negative for cough and shortness of breath.   Cardiovascular: Negative for chest pain.  Gastrointestinal: Negative for nausea and vomiting.  Musculoskeletal: Positive for arthralgias.    Objective:  Physical Exam Alert and oriented, no apparent distress.  General: Alert and oriented x3, cooperative and pleasant, no acute distress. Head: normocephalic, atraumatic, neck supple. Eyes: EOMI.  Musculoskeletal: Right knee exam: Slight flexion contracture Flexion over 100 with tightness No palpable effusion, warmth or erythema Slight varus to right knee Crepitation with range of motion Neurovascular intact distally Stable medial lateral collateral ligaments  Calves soft and  nontender. Motor function intact in LE. Strength 5/5 LE bilaterally. Neuro: Distal pulses 2+. Sensation to light touch intact in LE.  Slightly antalgic gait and walking without assistive devices. Left Knee Exam: moderate swelling. Well healed incision, dry and clean with no drainage or tenderness. AROM 0-120. No lateral instability with knee in 30 degree and full extension. Sensation and motor function intact in LE. Distal pulses 2+. Calves soft and nontender. Vital signs in last 24 hours:    Labs:   Estimated  body mass index is 31.57 kg/m as calculated from the following:   Height as of 12/15/20: _0  (1.778 m).   Weight as of 12/15/20: 99.8 kg.   Imaging Review Plain radiographs demonstrate severe degenerative joint disease of the right knee(s). The overall alignment isneutral. The bone quality appears to be adequate for age and reported activity level.   Assessment/Plan:  End stage arthritis, right knee   The patient history, physical examination, clinical judgment of the provider and imaging studies are consistent with end stage degenerative joint disease of the right knee(s) and total knee arthroplasty is deemed medically necessary. The treatment options including medical management, injection therapy arthroscopy and arthroplasty were discussed at length. The risks and benefits of total knee arthroplasty were presented and reviewed. The risks due to aseptic loosening, infection, stiffness, patella tracking problems, thromboembolic complications and other imponderables were discussed. The patient acknowledged the explanation, agreed to proceed with the plan and consent was signed. Patient is being admitted for inpatient treatment for surgery, pain control, PT, OT, prophylactic antibiotics, VTE prophylaxis, progressive ambulation and ADL's and discharge planning. The patient is planning to be discharged home.  Therapy Plans: outpatient therapy at [facility] Disposition: Home with  wife Planned DVT Prophylaxis: aspirin 69m BID DME needed: none PCP: Dr. SElesa Hacker clearance received Cardiologist: Dr. HDebara PickettTXA: IV Allergies: NKDA Anesthesia Concerns: prefers general anesthesia BMI: 33.1 Last HgbA1c: 7.1% on 10/18/20  Other: - Planning on SDD - Norco okay  Patient's anticipated LOS is less than 2 midnights, meeting these requirements: - Lives within 1 hour of care - Has a competent adult at home to recover with post-op recover - NO history of  - Chronic pain requiring opiods  - Diabetes  - Coronary Artery Disease  - Heart failure  - Heart attack  - Stroke  - DVT/VTE  - Cardiac arrhythmia  - Respiratory Failure/COPD  - Renal failure  - Anemia  - Advanced Liver disease  AGriffith Citron PA-C Orthopedic Surgery [facility] [place] ([phone]

## 2020-12-21 ENCOUNTER — Encounter (HOSPITAL_COMMUNITY)
Admission: RE | Disposition: A | Discharge status: Home / Self Care | Payer: Self-pay | Source: Other Acute Inpatient Hospital | Attending: Orthopedic Surgery

## 2020-12-21 ENCOUNTER — Ambulatory Visit (HOSPITAL_COMMUNITY)
Admission: RE | Admit: 2020-12-21 | Discharge: 2020-12-21 | Disposition: A | Discharge status: Home / Self Care | Payer: Medicare HMO | Source: Other Acute Inpatient Hospital | Attending: Orthopedic Surgery | Admitting: Orthopedic Surgery

## 2020-12-21 ENCOUNTER — Ambulatory Visit (HOSPITAL_COMMUNITY): Payer: Medicare HMO | Admitting: Certified Registered Nurse Anesthetist

## 2020-12-21 ENCOUNTER — Encounter (HOSPITAL_COMMUNITY): Payer: Self-pay | Admitting: Orthopedic Surgery

## 2020-12-21 DIAGNOSIS — Z96651 Presence of right artificial knee joint: Secondary | ICD-10-CM

## 2020-12-21 DIAGNOSIS — Z79899 Other long term (current) drug therapy: Secondary | ICD-10-CM | POA: Diagnosis not present

## 2020-12-21 DIAGNOSIS — Z96652 Presence of left artificial knee joint: Secondary | ICD-10-CM | POA: Diagnosis not present

## 2020-12-21 DIAGNOSIS — E119 Type 2 diabetes mellitus without complications: Secondary | ICD-10-CM | POA: Diagnosis not present

## 2020-12-21 DIAGNOSIS — M25461 Effusion, right knee: Secondary | ICD-10-CM | POA: Diagnosis not present

## 2020-12-21 DIAGNOSIS — M1711 Unilateral primary osteoarthritis, right knee: Secondary | ICD-10-CM | POA: Diagnosis not present

## 2020-12-21 DIAGNOSIS — Z8249 Family history of ischemic heart disease and other diseases of the circulatory system: Secondary | ICD-10-CM | POA: Diagnosis not present

## 2020-12-21 DIAGNOSIS — I10 Essential (primary) hypertension: Secondary | ICD-10-CM | POA: Diagnosis not present

## 2020-12-21 DIAGNOSIS — Z7982 Long term (current) use of aspirin: Secondary | ICD-10-CM | POA: Insufficient documentation

## 2020-12-21 DIAGNOSIS — M659 Synovitis and tenosynovitis, unspecified: Secondary | ICD-10-CM | POA: Diagnosis not present

## 2020-12-21 DIAGNOSIS — Z800 Family history of malignant neoplasm of digestive organs: Secondary | ICD-10-CM | POA: Insufficient documentation

## 2020-12-21 DIAGNOSIS — M25761 Osteophyte, right knee: Secondary | ICD-10-CM | POA: Insufficient documentation

## 2020-12-21 DIAGNOSIS — Z87891 Personal history of nicotine dependence: Secondary | ICD-10-CM | POA: Diagnosis not present

## 2020-12-21 DIAGNOSIS — G8918 Other acute postprocedural pain: Secondary | ICD-10-CM | POA: Diagnosis not present

## 2020-12-21 DIAGNOSIS — Z808 Family history of malignant neoplasm of other organs or systems: Secondary | ICD-10-CM | POA: Diagnosis not present

## 2020-12-21 DIAGNOSIS — Z7984 Long term (current) use of oral hypoglycemic drugs: Secondary | ICD-10-CM | POA: Insufficient documentation

## 2020-12-21 DIAGNOSIS — Z8546 Personal history of malignant neoplasm of prostate: Secondary | ICD-10-CM | POA: Insufficient documentation

## 2020-12-21 DIAGNOSIS — Z791 Long term (current) use of non-steroidal anti-inflammatories (NSAID): Secondary | ICD-10-CM | POA: Insufficient documentation

## 2020-12-21 HISTORY — PX: TOTAL KNEE ARTHROPLASTY: SHX125

## 2020-12-21 LAB — TYPE AND SCREEN
ABO/RH(D): O POS
Antibody Screen: NEGATIVE

## 2020-12-21 LAB — GLUCOSE, CAPILLARY
Glucose-Capillary: 159 mg/dL — ABNORMAL HIGH (ref 70–99)
Glucose-Capillary: 201 mg/dL — ABNORMAL HIGH (ref 70–99)

## 2020-12-21 SURGERY — ARTHROPLASTY, KNEE, TOTAL
Anesthesia: General | Site: Knee | Laterality: Right

## 2020-12-21 MED ORDER — EPHEDRINE 5 MG/ML INJ
INTRAVENOUS | Status: AC
  Filled 2020-12-21: qty 10

## 2020-12-21 MED ORDER — FENTANYL CITRATE (PF) 100 MCG/2ML IJ SOLN
50.0000 ug | INTRAMUSCULAR | Status: AC
  Administered 2020-12-21: 50 ug via INTRAVENOUS
  Filled 2020-12-21: qty 2

## 2020-12-21 MED ORDER — LACTATED RINGERS IV BOLUS
250.0000 mL | Freq: Once | INTRAVENOUS | Status: AC
  Administered 2020-12-21: 250 mL via INTRAVENOUS

## 2020-12-21 MED ORDER — BUPIVACAINE-EPINEPHRINE (PF) 0.25% -1:200000 IJ SOLN
INTRAMUSCULAR | Status: AC
  Filled 2020-12-21: qty 30

## 2020-12-21 MED ORDER — CHLORHEXIDINE GLUCONATE 0.12 % MT SOLN
15.0000 mL | Freq: Once | OROMUCOSAL | Status: AC
  Administered 2020-12-21: 15 mL via OROMUCOSAL

## 2020-12-21 MED ORDER — CEFAZOLIN SODIUM-DEXTROSE 2-4 GM/100ML-% IV SOLN
INTRAVENOUS | Status: AC
  Filled 2020-12-21: qty 100

## 2020-12-21 MED ORDER — DEXAMETHASONE SODIUM PHOSPHATE 10 MG/ML IJ SOLN
INTRAMUSCULAR | Status: AC
  Filled 2020-12-21: qty 1

## 2020-12-21 MED ORDER — LACTATED RINGERS IV SOLN: INTRAVENOUS | Status: DC

## 2020-12-21 MED ORDER — 0.9 % SODIUM CHLORIDE (POUR BTL) OPTIME
TOPICAL | Status: DC | PRN
  Administered 2020-12-21: 1000 mL

## 2020-12-21 MED ORDER — FENTANYL CITRATE (PF) 100 MCG/2ML IJ SOLN
INTRAMUSCULAR | Status: AC
  Filled 2020-12-21: qty 2

## 2020-12-21 MED ORDER — OXYCODONE HCL 5 MG PO TABS
5.0000 mg | ORAL_TABLET | Freq: Once | ORAL | Status: AC | PRN
  Administered 2020-12-21: 5 mg via ORAL

## 2020-12-21 MED ORDER — HYDROMORPHONE HCL 2 MG/ML IJ SOLN
INTRAMUSCULAR | Status: AC
  Filled 2020-12-21: qty 1

## 2020-12-21 MED ORDER — ONDANSETRON HCL 4 MG/2ML IJ SOLN: 4.0000 mg | Freq: Once | INTRAMUSCULAR | Status: DC | PRN

## 2020-12-21 MED ORDER — KETOROLAC TROMETHAMINE 30 MG/ML IJ SOLN
INTRAMUSCULAR | Status: AC
  Filled 2020-12-21: qty 1

## 2020-12-21 MED ORDER — ASPIRIN 81 MG PO CHEW: 81.0000 mg | CHEWABLE_TABLET | Freq: Two times a day (BID) | ORAL | 0 refills | Status: DC

## 2020-12-21 MED ORDER — METHOCARBAMOL 500 MG PO TABS: 500.0000 mg | ORAL_TABLET | Freq: Four times a day (QID) | ORAL | 0 refills | Status: DC | PRN

## 2020-12-21 MED ORDER — ROPIVACAINE HCL 5 MG/ML IJ SOLN
INTRAMUSCULAR | Status: DC | PRN
  Administered 2020-12-21: 30 mL via PERINEURAL

## 2020-12-21 MED ORDER — EPHEDRINE SULFATE-NACL 50-0.9 MG/10ML-% IV SOSY
PREFILLED_SYRINGE | INTRAVENOUS | Status: DC | PRN
  Administered 2020-12-21 (×2): 5 mg via INTRAVENOUS
  Administered 2020-12-21: 10 mg via INTRAVENOUS

## 2020-12-21 MED ORDER — DEXAMETHASONE SODIUM PHOSPHATE 4 MG/ML IJ SOLN
INTRAMUSCULAR | Status: DC | PRN
  Administered 2020-12-21: 10 mg via INTRAVENOUS

## 2020-12-21 MED ORDER — BUPIVACAINE-EPINEPHRINE (PF) 0.25% -1:200000 IJ SOLN
INTRAMUSCULAR | Status: DC | PRN
  Administered 2020-12-21: 30 mL

## 2020-12-21 MED ORDER — CEFAZOLIN SODIUM-DEXTROSE 2-4 GM/100ML-% IV SOLN
2.0000 g | Freq: Four times a day (QID) | INTRAVENOUS | Status: DC
  Administered 2020-12-21: 2 g via INTRAVENOUS

## 2020-12-21 MED ORDER — METHOCARBAMOL 500 MG IVPB - SIMPLE MED: 500.0000 mg | Freq: Four times a day (QID) | INTRAVENOUS | Status: DC | PRN

## 2020-12-21 MED ORDER — DOCUSATE SODIUM 100 MG PO CAPS: 100.0000 mg | ORAL_CAPSULE | Freq: Two times a day (BID) | ORAL | Status: DC

## 2020-12-21 MED ORDER — TRANEXAMIC ACID-NACL 1000-0.7 MG/100ML-% IV SOLN
1000.0000 mg | INTRAVENOUS | Status: AC
Start: 2020-12-21 — End: 2020-12-21
  Administered 2020-12-21: 1000 mg via INTRAVENOUS
  Filled 2020-12-21: qty 100

## 2020-12-21 MED ORDER — MIDAZOLAM HCL 2 MG/2ML IJ SOLN
1.0000 mg | INTRAMUSCULAR | Status: AC
  Administered 2020-12-21: 1 mg via INTRAVENOUS
  Filled 2020-12-21: qty 2

## 2020-12-21 MED ORDER — PROPOFOL 500 MG/50ML IV EMUL
INTRAVENOUS | Status: AC
  Filled 2020-12-21: qty 50

## 2020-12-21 MED ORDER — MIDAZOLAM HCL 2 MG/2ML IJ SOLN
INTRAMUSCULAR | Status: AC
  Filled 2020-12-21: qty 2

## 2020-12-21 MED ORDER — PROPOFOL 10 MG/ML IV BOLUS
INTRAVENOUS | Status: DC | PRN
  Administered 2020-12-21: 150 mg via INTRAVENOUS

## 2020-12-21 MED ORDER — SODIUM CHLORIDE (PF) 0.9 % IJ SOLN
INTRAMUSCULAR | Status: DC | PRN
  Administered 2020-12-21: 30 mL

## 2020-12-21 MED ORDER — SODIUM CHLORIDE 0.9 % IR SOLN
Status: DC | PRN
  Administered 2020-12-21: 1000 mL

## 2020-12-21 MED ORDER — ONDANSETRON HCL 4 MG/2ML IJ SOLN
INTRAMUSCULAR | Status: AC
  Filled 2020-12-21: qty 2

## 2020-12-21 MED ORDER — PROPOFOL 10 MG/ML IV BOLUS
INTRAVENOUS | Status: AC
  Filled 2020-12-21: qty 20

## 2020-12-21 MED ORDER — METHOCARBAMOL 500 MG PO TABS: 500.0000 mg | ORAL_TABLET | Freq: Four times a day (QID) | ORAL | Status: DC | PRN

## 2020-12-21 MED ORDER — DEXAMETHASONE SODIUM PHOSPHATE 10 MG/ML IJ SOLN: 10.0000 mg | Freq: Once | INTRAMUSCULAR | Status: DC

## 2020-12-21 MED ORDER — ONDANSETRON HCL 4 MG/2ML IJ SOLN
INTRAMUSCULAR | Status: DC | PRN
  Administered 2020-12-21: 4 mg via INTRAVENOUS

## 2020-12-21 MED ORDER — LIDOCAINE 2% (20 MG/ML) 5 ML SYRINGE
INTRAMUSCULAR | Status: AC
  Filled 2020-12-21: qty 5

## 2020-12-21 MED ORDER — FENTANYL CITRATE (PF) 100 MCG/2ML IJ SOLN: 25.0000 ug | INTRAMUSCULAR | Status: DC | PRN

## 2020-12-21 MED ORDER — SODIUM CHLORIDE (PF) 0.9 % IJ SOLN
INTRAMUSCULAR | Status: AC
  Filled 2020-12-21: qty 30

## 2020-12-21 MED ORDER — POLYETHYLENE GLYCOL 3350 17 G PO PACK: 17.0000 g | PACK | Freq: Two times a day (BID) | ORAL | 0 refills | Status: DC

## 2020-12-21 MED ORDER — PHENYLEPHRINE HCL (PRESSORS) 10 MG/ML IV SOLN
INTRAVENOUS | Status: AC
  Filled 2020-12-21: qty 1

## 2020-12-21 MED ORDER — TRANEXAMIC ACID-NACL 1000-0.7 MG/100ML-% IV SOLN
1000.0000 mg | Freq: Once | INTRAVENOUS | Status: AC
  Administered 2020-12-21: 1000 mg via INTRAVENOUS

## 2020-12-21 MED ORDER — HYDROCODONE-ACETAMINOPHEN 7.5-325 MG PO TABS: 1.0000 | ORAL_TABLET | ORAL | 0 refills | Status: DC | PRN

## 2020-12-21 MED ORDER — FENTANYL CITRATE (PF) 100 MCG/2ML IJ SOLN
INTRAMUSCULAR | Status: DC | PRN
  Administered 2020-12-21 (×4): 50 ug via INTRAVENOUS

## 2020-12-21 MED ORDER — OXYCODONE HCL 5 MG/5ML PO SOLN: 5.0000 mg | Freq: Once | ORAL | Status: AC | PRN

## 2020-12-21 MED ORDER — STERILE WATER FOR IRRIGATION IR SOLN
Status: DC | PRN
  Administered 2020-12-21: 2000 mL

## 2020-12-21 MED ORDER — TRANEXAMIC ACID-NACL 1000-0.7 MG/100ML-% IV SOLN
INTRAVENOUS | Status: AC
  Filled 2020-12-21: qty 100

## 2020-12-21 MED ORDER — HYDROMORPHONE HCL 1 MG/ML IJ SOLN
INTRAMUSCULAR | Status: DC | PRN
  Administered 2020-12-21: 1 mg via INTRAVENOUS

## 2020-12-21 MED ORDER — AMISULPRIDE (ANTIEMETIC) 5 MG/2ML IV SOLN: 10.0000 mg | Freq: Once | INTRAVENOUS | Status: DC | PRN

## 2020-12-21 MED ORDER — KETOROLAC TROMETHAMINE 30 MG/ML IJ SOLN
INTRAMUSCULAR | Status: DC | PRN
  Administered 2020-12-21: 30 mg

## 2020-12-21 MED ORDER — OXYCODONE HCL 5 MG PO TABS
ORAL_TABLET | ORAL | Status: AC
  Filled 2020-12-21: qty 1

## 2020-12-21 MED ORDER — CEFAZOLIN SODIUM-DEXTROSE 2-4 GM/100ML-% IV SOLN
2.0000 g | INTRAVENOUS | Status: AC
Start: 2020-12-21 — End: 2020-12-21
  Administered 2020-12-21: 2 g via INTRAVENOUS
  Filled 2020-12-21: qty 100

## 2020-12-21 MED ORDER — ORAL CARE MOUTH RINSE: 15.0000 mL | Freq: Once | OROMUCOSAL | Status: AC

## 2020-12-21 MED ORDER — LIDOCAINE 2% (20 MG/ML) 5 ML SYRINGE
INTRAMUSCULAR | Status: DC | PRN
  Administered 2020-12-21: 60 mg via INTRAVENOUS

## 2020-12-21 MED ORDER — LACTATED RINGERS IV BOLUS
500.0000 mL | Freq: Once | INTRAVENOUS | Status: AC
  Administered 2020-12-21: 500 mL via INTRAVENOUS

## 2020-12-21 MED ORDER — POVIDONE-IODINE 10 % EX SWAB
2.0000 | Freq: Once | CUTANEOUS | Status: AC
Start: 2020-12-21 — End: 2020-12-21
  Administered 2020-12-21: 2 via TOPICAL

## 2020-12-21 SURGICAL SUPPLY — 50 items
ATTUNE MED ANAT PAT 41 KNEE (Knees) ×2 IMPLANT
ATTUNE PS FEM RT SZ 6 CEM KNEE (Femur) ×2 IMPLANT
ATTUNE PSRP INSR SZ6 7 KNEE (Insert) ×2 IMPLANT
BAG ZIPLOCK 12X15 (MISCELLANEOUS) IMPLANT
BASE TIBIA ATTUNE KNEE SYS SZ6 (Knees) ×1 IMPLANT
BLADE SAW SGTL 11.0X1.19X90.0M (BLADE) IMPLANT
BLADE SAW SGTL 13.0X1.19X90.0M (BLADE) ×2 IMPLANT
BLADE SURG SZ10 CARB STEEL (BLADE) IMPLANT
BNDG ELASTIC 6X5.8 VLCR STR LF (GAUZE/BANDAGES/DRESSINGS) ×2 IMPLANT
BOWL SMART MIX CTS (DISPOSABLE) ×2 IMPLANT
CEMENT HV SMART SET (Cement) ×4 IMPLANT
COVER WAND RF STERILE (DRAPES) IMPLANT
CUFF TOURN SGL QUICK 34 (TOURNIQUET CUFF) ×1
CUFF TRNQT CYL 34X4.125X (TOURNIQUET CUFF) ×1 IMPLANT
DECANTER SPIKE VIAL GLASS SM (MISCELLANEOUS) ×8 IMPLANT
DERMABOND ADVANCED (GAUZE/BANDAGES/DRESSINGS) ×1
DERMABOND ADVANCED .7 DNX12 (GAUZE/BANDAGES/DRESSINGS) ×1 IMPLANT
DRAPE U-SHAPE 47X51 STRL (DRAPES) ×2 IMPLANT
DRESSING AQUACEL AG SP 3.5X10 (GAUZE/BANDAGES/DRESSINGS) ×1 IMPLANT
DRSG AQUACEL AG SP 3.5X10 (GAUZE/BANDAGES/DRESSINGS) ×2
DURAPREP 26ML APPLICATOR (WOUND CARE) ×4 IMPLANT
ELECT REM PT RETURN 15FT ADLT (MISCELLANEOUS) ×2 IMPLANT
GLOVE ORTHO TXT STRL SZ7.5 (GLOVE) ×2 IMPLANT
GLOVE SURG ENC MOIS LTX SZ6 (GLOVE) ×2 IMPLANT
GLOVE SURG UNDER POLY LF SZ6.5 (GLOVE) ×2 IMPLANT
GLOVE SURG UNDER POLY LF SZ7.5 (GLOVE) ×2 IMPLANT
GOWN STRL REUS W/TWL LRG LVL3 (GOWN DISPOSABLE) ×2 IMPLANT
HANDPIECE INTERPULSE COAX TIP (DISPOSABLE) ×1
KIT TURNOVER KIT A (KITS) ×2 IMPLANT
MANIFOLD NEPTUNE II (INSTRUMENTS) ×2 IMPLANT
NDL SAFETY ECLIPSE 18X1.5 (NEEDLE) ×1 IMPLANT
NEEDLE HYPO 18GX1.5 SHARP (NEEDLE) ×1
NS IRRIG 1000ML POUR BTL (IV SOLUTION) ×2 IMPLANT
PACK TOTAL KNEE CUSTOM (KITS) ×2 IMPLANT
PENCIL SMOKE EVACUATOR (MISCELLANEOUS) ×2 IMPLANT
PIN DRILL FIX HALF THREAD (BIT) ×2 IMPLANT
PIN FIX SIGMA LCS THRD HI (PIN) ×2 IMPLANT
PROTECTOR NERVE ULNAR (MISCELLANEOUS) ×2 IMPLANT
SET HNDPC FAN SPRY TIP SCT (DISPOSABLE) ×1 IMPLANT
SET PAD KNEE POSITIONER (MISCELLANEOUS) ×2 IMPLANT
SUT MNCRL AB 4-0 PS2 18 (SUTURE) ×2 IMPLANT
SUT STRATAFIX PDS+ 0 24IN (SUTURE) ×2 IMPLANT
SUT VIC AB 1 CT1 36 (SUTURE) ×2 IMPLANT
SUT VIC AB 2-0 CT1 27 (SUTURE) ×3
SUT VIC AB 2-0 CT1 TAPERPNT 27 (SUTURE) ×3 IMPLANT
SYR 3ML LL SCALE MARK (SYRINGE) ×2 IMPLANT
TIBIA ATTUNE KNEE SYS BASE SZ6 (Knees) ×2 IMPLANT
TUBE SUCTION HIGH CAP CLEAR NV (SUCTIONS) ×2 IMPLANT
WATER STERILE IRR 1000ML POUR (IV SOLUTION) ×4 IMPLANT
WRAP KNEE MAXI GEL POST OP (GAUZE/BANDAGES/DRESSINGS) ×2 IMPLANT

## 2020-12-21 NOTE — Anesthesia Procedure Notes (Signed)
Procedure Name: LMA Insertion Date/Time: 12/21/2020 9:52 AM Performed by: Claudia Desanctis, CRNA Pre-anesthesia Checklist: Emergency Drugs available, Patient identified, Suction available and Patient being monitored Patient Re-evaluated:Patient Re-evaluated prior to induction Oxygen Delivery Method: Circle system utilized Preoxygenation: Pre-oxygenation with 100% oxygen Induction Type: IV induction LMA: LMA inserted LMA Size: 4.0 Number of attempts: 1 Placement Confirmation: positive ETCO2 and breath sounds checked- equal and bilateral Tube secured with: Tape Dental Injury: Teeth and Oropharynx as per pre-operative assessment

## 2020-12-21 NOTE — Transfer of Care (Signed)
Immediate Anesthesia Transfer of Care Note  Patient: Kevin Olson  Procedure(s) Performed: TOTAL KNEE ARTHROPLASTY (Right Knee)  Patient Location: PACU  Anesthesia Type:GA combined with regional for post-op pain  Level of Consciousness: awake and patient cooperative  Airway & Oxygen Therapy: Patient Spontanous Breathing and Patient connected to face mask  Post-op Assessment: Report given to RN and Post -op Vital signs reviewed and stable  Post vital signs: Reviewed and stable  Last Vitals:  Vitals Value Taken Time  BP 155/58 12/21/20 1154  Temp    Pulse 82 12/21/20 1155  Resp 17 12/21/20 1155  SpO2 100 % 12/21/20 1155  Vitals shown include unvalidated device data.  Last Pain:  Vitals:   12/21/20 0743  TempSrc:   PainSc: 0-No pain      Patients Stated Pain Goal: 4 (80/03/49 1791)  Complications: No complications documented.

## 2020-12-21 NOTE — Evaluation (Signed)
Physical Therapy Evaluation Patient Details Name: Kevin Olson MRN: [id number] DOB: [birth year] Today's Date: 12/21/2020   History of Present Illness  Pt is 75 yo male s/p R TKA on 12/21/20.  PMH includes OA, HTN, DM, R TKA 2/22.  Clinical Impression  Pt is s/p L TKA resulting in the deficits listed below (see PT Problem List). Pt seen in PACU for possible same day discharge.  Pt is very motivated and recently had R TKA so is familiar with HEP, transfer techniques, and the rehab process.  He had good quad control, ROM, and pain control.  Pt was able to demonstrate transfers, gait, and stairs safely to return home with family but pt will benefit from skilled PT to increase their independence and safety with mobility while hospitalized.      Follow Up Recommendations Follow surgeon's recommendation for DC plan and follow-up therapies;Supervision for mobility/OOB    Equipment Recommendations  None recommended by PT    Recommendations for Other Services       Precautions / Restrictions Precautions Precautions: None Restrictions Weight Bearing Restrictions: Yes RLE Weight Bearing: Weight bearing as tolerated      Mobility  Bed Mobility Overal bed mobility: Modified Independent             General bed mobility comments: HOB elevated    Transfers Overall transfer level: Needs assistance Equipment used: Rolling walker (2 wheeled) Transfers: Sit to/from Stand Sit to Stand: Min guard         General transfer comment: Demonstrated safe hand placement and good R LE management  Ambulation/Gait Ambulation/Gait assistance: Min guard Gait Distance (Feet): 80 Feet Assistive device: Rolling walker (2 wheeled) Gait Pattern/deviations: Step-to pattern;Decreased weight shift to right Gait velocity: decreased   General Gait Details: steady gait; step to right pattern; good use of RW  Stairs Stairs: Yes Stairs assistance: Min guard Stair Management: Two rails;Step to  pattern;Forwards Number of Stairs: 5 General stair comments: Performed safely , cues for sequencing  Wheelchair Mobility    Modified Rankin (Stroke Patients Only)       Balance Overall balance assessment: Needs assistance Sitting-balance support: No upper extremity supported Sitting balance-Leahy Scale: Normal     Standing balance support: Bilateral upper extremity supported;No upper extremity supported Standing balance-Leahy Scale: Fair Standing balance comment: RW for ambulation; no AD weight shift and static stand                             Pertinent Vitals/Pain Pain Assessment: 0-10 Pain Score: 5  Pain Location: R knee Pain Descriptors / Indicators: Discomfort;Sore Pain Intervention(s): Limited activity within patient's tolerance;Monitored during session;Premedicated before session    [place] expects to be discharged to:: Private residence Living Arrangements: Spouse/significant other Available Help at Discharge: Family;Available 24 hours/day Type of Home: House Home Access: Stairs to enter Entrance Stairs-Rails: Right;Left;Can reach both Entrance Stairs-Number of Steps: 4 Home Layout: One level Home Equipment: Shower seat;Bedside commode;Walker - 2 wheels      Prior Function Level of Independence: Independent         Comments: Had L TKA in February and returned to normal activities     Hand Dominance        Extremity/Trunk Assessment   Upper Extremity Assessment Upper Extremity Assessment: Overall WFL for tasks assessed    Lower Extremity Assessment Lower Extremity Assessment: RLE deficits/detail RLE Deficits / Details: Expected post op changes; ROM : knee 0 to 90 degrees,  hip and ankle WFL; MMT: hip and knee 3/5, ankle 5/5 RLE Sensation: WNL    Cervical / Trunk Assessment Cervical / Trunk Assessment: Normal  Communication   Communication: No difficulties  Cognition Arousal/Alertness: Awake/alert Behavior  During Therapy: WFL for tasks assessed/performed Overall Cognitive Status: Within Functional Limits for tasks assessed                                        General Comments General comments (skin integrity, edema, etc.): Pt with tremors - baseline.  Educated on safe ice use, no pivots, resting with leg straight, and HEP    Exercises Total Joint Exercises Ankle Circles/Pumps: AROM;Both;10 reps;Supine Quad Sets: AROM;Both;10 reps;Supine Towel Squeeze: AROM;Both;10 reps;Supine Heel Slides: AROM;Right;5 reps;Supine Hip ABduction/ADduction: AROM;Right;5 reps;Supine Long Arc Quad: AROM;5 reps;Right;Seated Goniometric ROM: R knee 0 to 90 degrees Other Exercises Other Exercises: cued for correct form on all exercises; educated for [facility] techniques as needed   Assessment/Plan    PT Assessment Patient needs continued PT services  PT Problem List Decreased strength;Decreased mobility;Decreased safety awareness;Decreased range of motion;Decreased knowledge of precautions;Decreased activity tolerance;Cardiopulmonary status limiting activity;Decreased balance;Decreased knowledge of use of DME;Pain       PT Treatment Interventions DME instruction;Therapeutic activities;Modalities;Gait training;Therapeutic exercise;Patient/family education;Stair training;Balance training;Functional mobility training    PT Goals (Current goals can be found in the Care Plan section)  Acute Rehab PT Goals Patient Stated Goal: return home; going to [place] on August PT Goal Formulation: With patient Time For Goal Achievement: 01/04/21 Potential to Achieve Goals: Good    Frequency 7X/week   Barriers to discharge        Co-evaluation               AM-PAC PT "6 Clicks" Mobility  Outcome Measure Help needed turning from your back to your side while in a flat bed without using bedrails?: A Little Help needed moving from lying on your back to sitting on the side of a flat bed without using  bedrails?: A Little Help needed moving to and from a bed to a chair (including a wheelchair)?: A Little Help needed standing up from a chair using your arms (e.g., wheelchair or bedside chair)?: A Little Help needed to walk in hospital room?: A Little Help needed climbing 3-5 steps with a railing? : A Little 6 Click Score: 18    End of Session Equipment Utilized During Treatment: Gait belt Activity Tolerance: Patient tolerated treatment well Patient left: in bed;with call bell/phone within reach (in PACU) Nurse Communication: Mobility status PT Visit Diagnosis: Other abnormalities of gait and mobility (R26.89);Muscle weakness (generalized) (M62.81);Pain Pain - Right/Left: Right Pain - part of body: Knee    Time: 9937-1696 PT Time Calculation (min) (ACUTE ONLY): 23 min   Charges:   PT Evaluation $PT Eval Low Complexity: 1 Low PT Treatments $Therapeutic Exercise: 8-22 mins        Abran Richard, PT Acute Rehab Services Pager [phone] [facility] [phone]    Karlton Lemon 12/21/2020, 3:33 PM

## 2020-12-21 NOTE — Anesthesia Procedure Notes (Signed)
Anesthesia Regional Block: Adductor canal block   Pre-Anesthetic Checklist: ,, timeout performed, Correct Patient, Correct Site, Correct Laterality, Correct Procedure, Correct Position, site marked, Risks and benefits discussed,  Surgical consent,  Pre-op evaluation,  At surgeon's request and post-op pain management  Laterality: Right  Prep: chloraprep       Needles:  Injection technique: Single-shot  Needle Type: Echogenic Stimulator Needle     Needle Length: 10cm  Needle Gauge: 20     Additional Needles:   Procedures:,,,, ultrasound used (permanent image in chart),,,,  Narrative:  Start time: 12/21/2020 8:54 AM End time: 12/21/2020 9:00 AM Injection made incrementally with aspirations every 5 mL.  Performed by: Personally  Anesthesiologist: Lidia Collum, MD  Additional Notes: Standard monitors applied. Skin prepped. Good needle visualization with ultrasound. Injection made in 5cc increments with no resistance to injection. Patient tolerated the procedure well.

## 2020-12-21 NOTE — Op Note (Signed)
NAME:  Kevin Olson                      MEDICAL RECORD NO.:  [id number]                             FACILITY:  [facility]      PHYSICIAN:  Pietro Cassis. Alvan Dame, M.D.  DATE OF BIRTH:  Nov 21, 1945      DATE OF PROCEDURE:  12/21/2020                                     OPERATIVE REPORT         PREOPERATIVE DIAGNOSIS:  Right knee osteoarthritis.      POSTOPERATIVE DIAGNOSIS:  Right knee osteoarthritis.      FINDINGS:  The patient was noted to have complete loss of cartilage and   bone-on-bone arthritis with associated osteophytes in the medial and patellofemoral compartments of   the knee with a significant synovitis and associated effusion.  The patient had failed months of conservative treatment including medications, injection therapy, activity modification.     PROCEDURE:  Right total knee replacement.      COMPONENTS USED:  DePuy Attune rotating platform posterior stabilized knee   system, a size 6 femur, 6 tibia, size 7 mm PS AOX insert, and 41 anatomic patellar   button.      SURGEON:  Pietro Cassis. Alvan Dame, M.D.      ASSISTANT:  Griffith Citron, PA-C.      ANESTHESIA:  General and Regional.      SPECIMENS:  None.      COMPLICATION:  None.      DRAINS:  None.  EBL: <100 cc      TOURNIQUET TIME:   Total Tourniquet Time Documented: Thigh (Right) - 43 minutes Total: Thigh (Right) - 43 minutes  .      The patient was stable to the recovery room.      INDICATION FOR PROCEDURE:  Kevin Olson is a 75 y.o. male patient of   mine.  The patient had been seen, evaluated, and treated for months conservatively in the   office with medication, activity modification, and injections.  The patient had   radiographic changes of bone-on-bone arthritis with endplate sclerosis and osteophytes noted.  Based on the radiographic changes and failed conservative measures, the patient   decided to proceed with definitive treatment, total knee replacement.  Risks of infection, DVT, component failure, need  for revision surgery, neurovascular injury were reviewed in the office setting.  The postop course was reviewed stressing the efforts to maximize post-operative satisfaction and function.  Consent was obtained for benefit of pain   relief.      PROCEDURE IN DETAIL:  The patient was brought to the operative theater.   Once adequate anesthesia, preoperative antibiotics, 2 gm of Ancef,1 gm of Tranexamic Acid, and 10 mg of Decadron administered, the patient was positioned supine with a right thigh tourniquet placed.  The  right lower extremity was prepped and draped in sterile fashion.  A time-   out was performed identifying the patient, planned procedure, and the appropriate extremity.      The right lower extremity was placed in the [facility] leg holder.  The leg was   exsanguinated, tourniquet elevated to 250 mmHg.  A midline incision was   made followed  by median parapatellar arthrotomy.  Following initial   exposure, attention was first directed to the patella.  Precut   measurement was noted to be 26 mm.  I resected down to 14 mm and used a   41 anatomic patellar button to restore patellar height as well as cover the cut surface.      The lug holes were drilled and a metal shim was placed to protect the   patella from retractors and saw blade during the procedure.      At this point, attention was now directed to the femur.  The femoral   canal was opened with a drill, irrigated to try to prevent fat emboli.  An   intramedullary rod was passed at 5 degrees valgus, 11 mm of bone was   resected off the distal femur due to pre-operative flexion contracture.  Following this resection, the tibia was   subluxated anteriorly.  Using the extramedullary guide, 2 mm of bone was resected off   the proximal medial tibia.  We confirmed the gap would be   stable medially and laterally with a size 5 spacer block as well as confirmed that the tibial cut was perpendicular in the coronal plane, checking with  an alignment rod.      Once this was done, I sized the femur to be a size 6 in the anterior-   posterior dimension, chose a standard component based on medial and   lateral dimension.  The size 6 rotation block was then pinned in   position anterior referenced using the C-clamp to set rotation.  The   anterior, posterior, and  chamfer cuts were made without difficulty nor   notching making certain that I was along the anterior cortex to help   with flexion gap stability.      The final box cut was made off the lateral aspect of distal femur.      At this point, the tibia was sized to be a size 6.  The size 6 tray was   then pinned in position through the medial third of the tubercle,   drilled, and keel punched.  Trial reduction was now carried with a 6 femur,  6 tibia, a size 7 mm PS insert, and the 41 anatomic patella botton.  The knee was brought to full extension with good flexion stability with the patella   tracking through the trochlea without application of pressure.  Given   all these findings the trial components removed.  Final components were   opened and cement was mixed.  The knee was irrigated with normal saline solution and pulse lavage.  The synovial lining was   then injected with 30 cc of 0.25% Marcaine with epinephrine, 1 cc of Toradol and 30 cc of NS for a total of 61 cc.     Final implants were then cemented onto cleaned and dried cut surfaces of bone with the knee brought to extension with a size 7 mm PS trial insert.      Once the cement had fully cured, excess cement was removed   throughout the knee.  I confirmed that I was satisfied with the range of   motion and stability, and the final size 7 mm PS AOX insert was chosen.  It was   placed into the knee.      The tourniquet had been let down at 43 minutes.  No significant   hemostasis was required.  The extensor mechanism was then reapproximated  using #1 Vicryl and #1 Stratafix sutures with the knee   in  flexion.  The   remaining wound was closed with 2-0 Vicryl and running 4-0 Monocryl.   The knee was cleaned, dried, dressed sterilely using Dermabond and   Aquacel dressing.  The patient was then   brought to recovery room in stable condition, tolerating the procedure   well.   Please note that Physician Assistant, Griffith Citron, PA-C was present for the entirety of the case, and was utilized for pre-operative positioning, peri-operative retractor management, general facilitation of the procedure and for primary wound closure at the end of the case.              Pietro Cassis Alvan Dame, M.D.    12/21/2020 11:25 AM

## 2020-12-21 NOTE — Discharge Instructions (Signed)

## 2020-12-21 NOTE — Anesthesia Preprocedure Evaluation (Signed)
Anesthesia Evaluation  Patient identified by MRN, date of birth, ID band Patient awake    Reviewed: Allergy & Precautions, H&P , NPO status , Patient's Chart, lab work & pertinent test results  History of Anesthesia Complications Negative for: history of anesthetic complications  Airway Mallampati: II  TM Distance: >3 FB Neck ROM: Full    Dental  (+) Teeth Intact   Pulmonary neg pulmonary ROS, former smoker,    Pulmonary exam normal        Cardiovascular hypertension, Normal cardiovascular exam     Neuro/Psych negative neurological ROS     GI/Hepatic negative GI ROS, Neg liver ROS,   Endo/Other  diabetes, Type 2  Renal/GU negative Renal ROS   Prostate cancer    Musculoskeletal  (+) Arthritis , Osteoarthritis,    Abdominal   Peds  Hematology negative hematology ROS (+)   Anesthesia Other Findings   Reproductive/Obstetrics                             Anesthesia Physical Anesthesia Plan  ASA: II  Anesthesia Plan: General   Post-op Pain Management: GA combined w/ Regional for post-op pain   Induction: Intravenous  PONV Risk Score and Plan: 2 and Ondansetron, Dexamethasone, Midazolam and Treatment may vary due to age or medical condition  Airway Management Planned: LMA  Additional Equipment: None  Intra-op Plan:   Post-operative Plan: Extubation in OR  Informed Consent: I have reviewed the patients History and Physical, chart, labs and discussed the procedure including the risks, benefits and alternatives for the proposed anesthesia with the patient or authorized representative who has indicated his/her understanding and acceptance.     Dental advisory given  Plan Discussed with:   Anesthesia Plan Comments: (Plan for GA due to patient preference. No issues with GA for prior knee replacement.)        Anesthesia Quick Evaluation

## 2020-12-21 NOTE — Progress Notes (Signed)
Assisted Dr. Jeannine Boga with Right Knee adductor Canal  block. Side rails up, monitors on throughout procedure. See vital signs in flow sheet. Tolerated Procedure well.

## 2020-12-21 NOTE — Anesthesia Postprocedure Evaluation (Signed)
Anesthesia Post Note  Patient: Leeum Sankey  Procedure(s) Performed: TOTAL KNEE ARTHROPLASTY (Right Knee)     Patient location during evaluation: PACU Anesthesia Type: General Level of consciousness: awake and alert Pain management: pain level controlled Vital Signs Assessment: post-procedure vital signs reviewed and stable Respiratory status: spontaneous breathing, nonlabored ventilation and respiratory function stable Cardiovascular status: blood pressure returned to baseline and stable Postop Assessment: no apparent nausea or vomiting Anesthetic complications: no   No complications documented.  Last Vitals:  Vitals:   12/21/20 1230 12/21/20 1245  BP: (!) 142/59 (!) 146/58  Pulse: 77 86  Resp: 12 (!) 9  Temp:    SpO2: 100% 100%    Last Pain:  Vitals:   12/21/20 1245  TempSrc:   PainSc: 2                  Lidia Collum

## 2020-12-22 ENCOUNTER — Encounter (HOSPITAL_COMMUNITY): Payer: Self-pay | Admitting: Orthopedic Surgery

## 2020-12-27 DIAGNOSIS — M25561 Pain in right knee: Secondary | ICD-10-CM | POA: Diagnosis not present

## 2020-12-29 DIAGNOSIS — M25561 Pain in right knee: Secondary | ICD-10-CM | POA: Diagnosis not present

## 2020-12-31 DIAGNOSIS — M25561 Pain in right knee: Secondary | ICD-10-CM | POA: Diagnosis not present

## 2021-01-03 DIAGNOSIS — M25561 Pain in right knee: Secondary | ICD-10-CM | POA: Diagnosis not present

## 2021-01-07 DIAGNOSIS — M25561 Pain in right knee: Secondary | ICD-10-CM | POA: Diagnosis not present

## 2021-01-10 DIAGNOSIS — M25561 Pain in right knee: Secondary | ICD-10-CM | POA: Diagnosis not present

## 2021-01-13 DIAGNOSIS — M25561 Pain in right knee: Secondary | ICD-10-CM | POA: Diagnosis not present

## 2021-01-18 DIAGNOSIS — M25561 Pain in right knee: Secondary | ICD-10-CM | POA: Diagnosis not present

## 2021-01-20 DIAGNOSIS — M25561 Pain in right knee: Secondary | ICD-10-CM | POA: Diagnosis not present

## 2021-01-27 ENCOUNTER — Other Ambulatory Visit: Payer: Self-pay | Admitting: Internal Medicine

## 2021-01-27 MED ORDER — REPATHA SURECLICK 140 MG/ML ~~LOC~~ SOAJ: 1.0000 | SUBCUTANEOUS | 8 refills | Status: DC

## 2021-01-27 NOTE — Telephone Encounter (Signed)
Repatha refilled to [facility] mail order pharmacy

## 2021-01-31 DIAGNOSIS — C61 Malignant neoplasm of prostate: Secondary | ICD-10-CM | POA: Diagnosis not present

## 2021-02-07 DIAGNOSIS — I10 Essential (primary) hypertension: Secondary | ICD-10-CM | POA: Diagnosis not present

## 2021-02-07 DIAGNOSIS — Z471 Aftercare following joint replacement surgery: Secondary | ICD-10-CM | POA: Diagnosis not present

## 2021-02-07 DIAGNOSIS — Z8546 Personal history of malignant neoplasm of prostate: Secondary | ICD-10-CM | POA: Diagnosis not present

## 2021-02-07 DIAGNOSIS — E1142 Type 2 diabetes mellitus with diabetic polyneuropathy: Secondary | ICD-10-CM | POA: Diagnosis not present

## 2021-02-07 DIAGNOSIS — Z96652 Presence of left artificial knee joint: Secondary | ICD-10-CM | POA: Diagnosis not present

## 2021-02-07 DIAGNOSIS — E1140 Type 2 diabetes mellitus with diabetic neuropathy, unspecified: Secondary | ICD-10-CM | POA: Diagnosis not present

## 2021-02-07 DIAGNOSIS — E782 Mixed hyperlipidemia: Secondary | ICD-10-CM | POA: Diagnosis not present

## 2021-02-07 DIAGNOSIS — E119 Type 2 diabetes mellitus without complications: Secondary | ICD-10-CM | POA: Diagnosis not present

## 2021-02-07 DIAGNOSIS — M1990 Unspecified osteoarthritis, unspecified site: Secondary | ICD-10-CM | POA: Diagnosis not present

## 2021-02-07 DIAGNOSIS — E039 Hypothyroidism, unspecified: Secondary | ICD-10-CM | POA: Diagnosis not present

## 2021-02-07 DIAGNOSIS — Z96651 Presence of right artificial knee joint: Secondary | ICD-10-CM | POA: Diagnosis not present

## 2021-02-15 DIAGNOSIS — Z961 Presence of intraocular lens: Secondary | ICD-10-CM | POA: Diagnosis not present

## 2021-03-04 DIAGNOSIS — M1990 Unspecified osteoarthritis, unspecified site: Secondary | ICD-10-CM | POA: Diagnosis not present

## 2021-03-04 DIAGNOSIS — E039 Hypothyroidism, unspecified: Secondary | ICD-10-CM | POA: Diagnosis not present

## 2021-03-04 DIAGNOSIS — E1142 Type 2 diabetes mellitus with diabetic polyneuropathy: Secondary | ICD-10-CM | POA: Diagnosis not present

## 2021-03-04 DIAGNOSIS — E1140 Type 2 diabetes mellitus with diabetic neuropathy, unspecified: Secondary | ICD-10-CM | POA: Diagnosis not present

## 2021-03-04 DIAGNOSIS — Z7984 Long term (current) use of oral hypoglycemic drugs: Secondary | ICD-10-CM | POA: Diagnosis not present

## 2021-03-04 DIAGNOSIS — I700 Atherosclerosis of aorta: Secondary | ICD-10-CM | POA: Diagnosis not present

## 2021-03-04 DIAGNOSIS — Z0000 Encounter for general adult medical examination without abnormal findings: Secondary | ICD-10-CM | POA: Diagnosis not present

## 2021-03-04 DIAGNOSIS — I10 Essential (primary) hypertension: Secondary | ICD-10-CM | POA: Diagnosis not present

## 2021-03-04 DIAGNOSIS — Z125 Encounter for screening for malignant neoplasm of prostate: Secondary | ICD-10-CM | POA: Diagnosis not present

## 2021-03-04 DIAGNOSIS — E782 Mixed hyperlipidemia: Secondary | ICD-10-CM | POA: Diagnosis not present

## 2021-04-05 DIAGNOSIS — E1142 Type 2 diabetes mellitus with diabetic polyneuropathy: Secondary | ICD-10-CM | POA: Diagnosis not present

## 2021-04-05 DIAGNOSIS — E782 Mixed hyperlipidemia: Secondary | ICD-10-CM | POA: Diagnosis not present

## 2021-04-05 DIAGNOSIS — E1140 Type 2 diabetes mellitus with diabetic neuropathy, unspecified: Secondary | ICD-10-CM | POA: Diagnosis not present

## 2021-04-05 DIAGNOSIS — M1990 Unspecified osteoarthritis, unspecified site: Secondary | ICD-10-CM | POA: Diagnosis not present

## 2021-04-05 DIAGNOSIS — E039 Hypothyroidism, unspecified: Secondary | ICD-10-CM | POA: Diagnosis not present

## 2021-04-05 DIAGNOSIS — I10 Essential (primary) hypertension: Secondary | ICD-10-CM | POA: Diagnosis not present

## 2021-05-27 DIAGNOSIS — E782 Mixed hyperlipidemia: Secondary | ICD-10-CM | POA: Diagnosis not present

## 2021-05-27 DIAGNOSIS — E039 Hypothyroidism, unspecified: Secondary | ICD-10-CM | POA: Diagnosis not present

## 2021-05-27 DIAGNOSIS — M1990 Unspecified osteoarthritis, unspecified site: Secondary | ICD-10-CM | POA: Diagnosis not present

## 2021-05-27 DIAGNOSIS — E1142 Type 2 diabetes mellitus with diabetic polyneuropathy: Secondary | ICD-10-CM | POA: Diagnosis not present

## 2021-05-27 DIAGNOSIS — E1140 Type 2 diabetes mellitus with diabetic neuropathy, unspecified: Secondary | ICD-10-CM | POA: Diagnosis not present

## 2021-05-27 DIAGNOSIS — I10 Essential (primary) hypertension: Secondary | ICD-10-CM | POA: Diagnosis not present

## 2021-06-08 ENCOUNTER — Other Ambulatory Visit: Payer: Self-pay | Admitting: Internal Medicine

## 2021-06-10 DIAGNOSIS — Z23 Encounter for immunization: Secondary | ICD-10-CM | POA: Diagnosis not present

## 2021-06-14 ENCOUNTER — Telehealth: Payer: Self-pay | Admitting: Internal Medicine

## 2021-06-14 NOTE — Telephone Encounter (Signed)
PA for repatha sureclick submitted by phone call to [facility] [phone]) Ref: [id number]  Total time: 15 mins

## 2021-06-15 NOTE — Telephone Encounter (Signed)
Patient is approved for Repatha until 09/17/2021

## 2021-07-06 DIAGNOSIS — M7989 Other specified soft tissue disorders: Secondary | ICD-10-CM | POA: Diagnosis not present

## 2021-07-11 ENCOUNTER — Other Ambulatory Visit: Payer: Self-pay | Admitting: Family Medicine

## 2021-07-11 ENCOUNTER — Other Ambulatory Visit: Payer: Self-pay

## 2021-07-11 ENCOUNTER — Ambulatory Visit (INDEPENDENT_AMBULATORY_CARE_PROVIDER_SITE_OTHER): Payer: Medicare HMO

## 2021-07-11 DIAGNOSIS — R609 Edema, unspecified: Secondary | ICD-10-CM

## 2021-07-11 DIAGNOSIS — M7989 Other specified soft tissue disorders: Secondary | ICD-10-CM | POA: Diagnosis not present

## 2021-07-11 IMAGING — US US EXTREM LOW VENOUS*R*
1 series · 14 of 24 positions shown · non-contrast
Comparison: Pelvis and hip radiographs, 10/03/2010.

CLINICAL DATA: RIGHT lower extremity swelling x1 month

EXAM:
RIGHT LOWER EXTREMITY VENOUS DOPPLER ULTRASOUND
TECHNIQUE: Gray-scale sonography with compression, as well as color and duplex
ultrasound, were performed to evaluate the deep venous system(s)
from the level of the common femoral vein through the popliteal and
proximal calf veins.

[Series 1: us venous img lower uni right (dvt) · portal-venous · 14 of 42 slices shown]
[im 1/42]
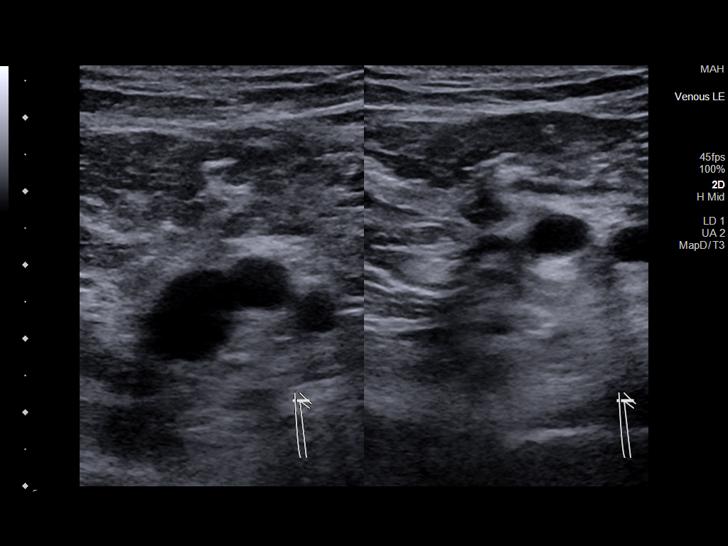
[im 4/42]
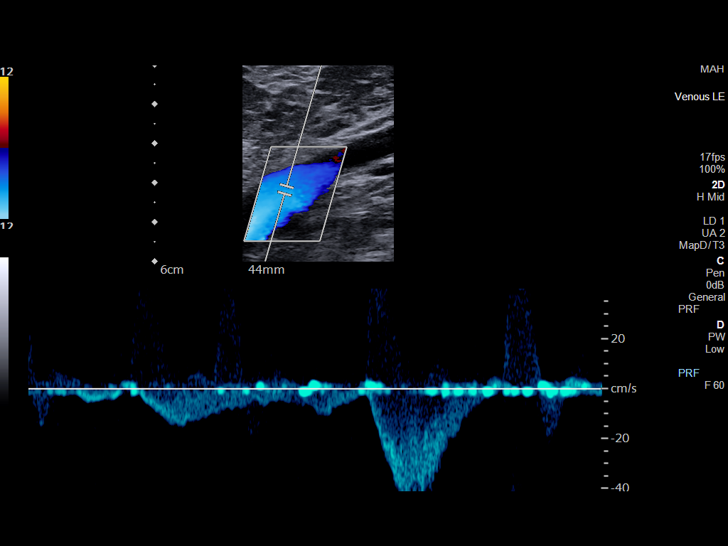
[im 8/42]
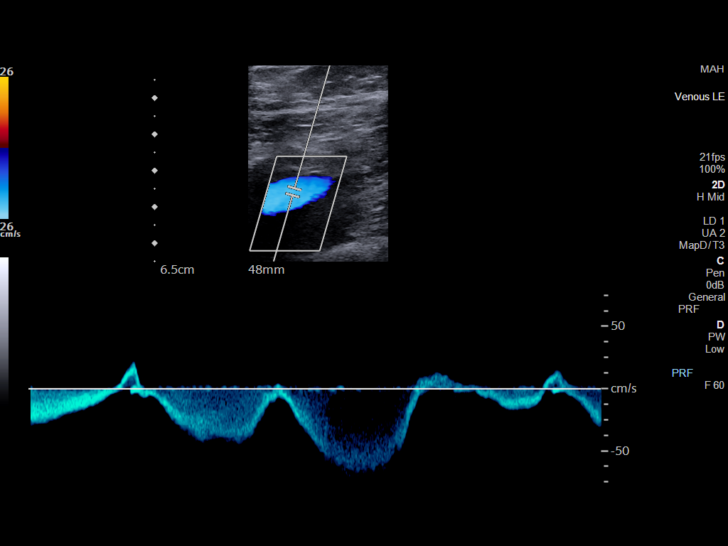
[im 11/42]
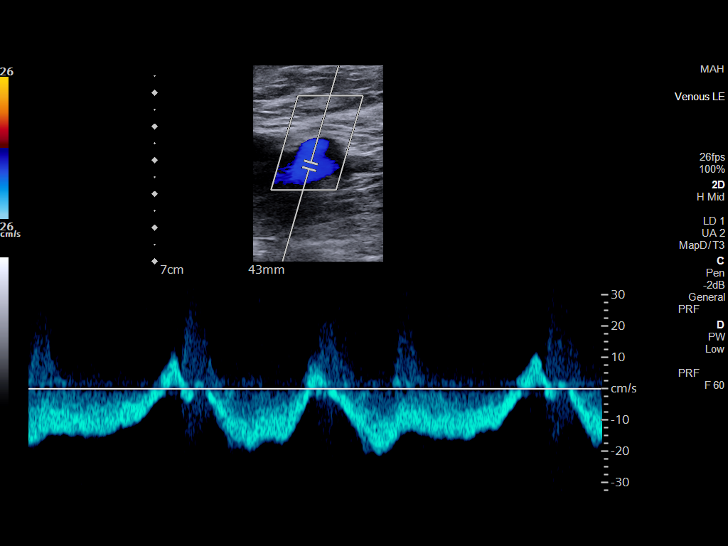
[im 13/42]
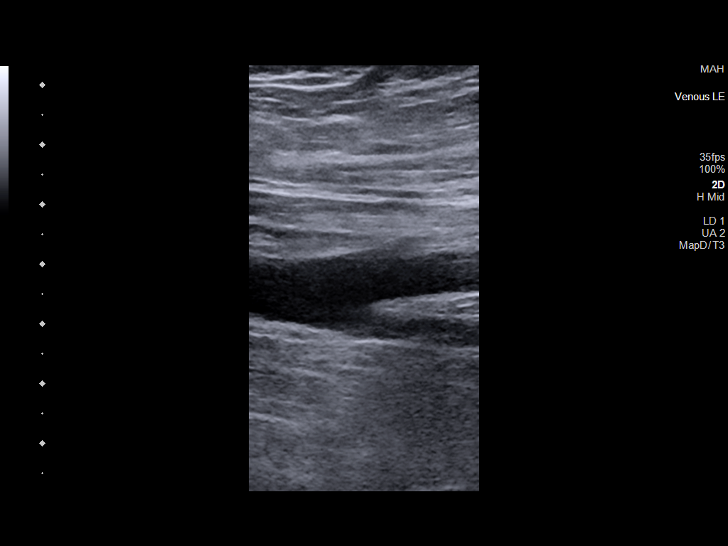
[im 17/42]
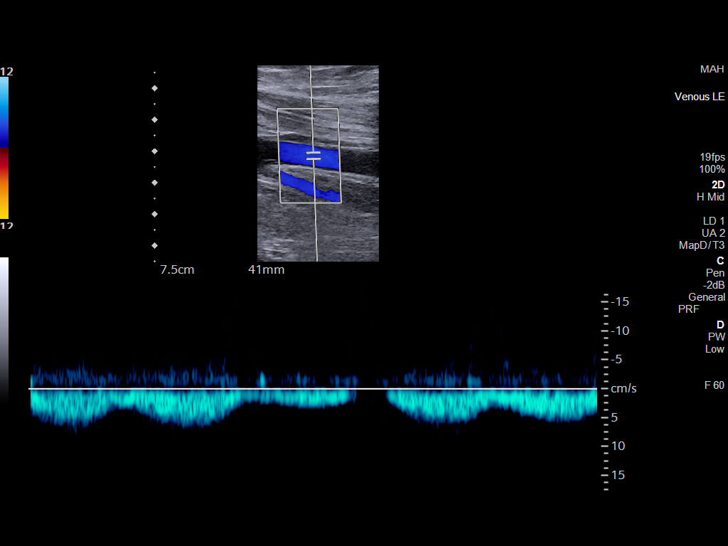
[im 20/42]
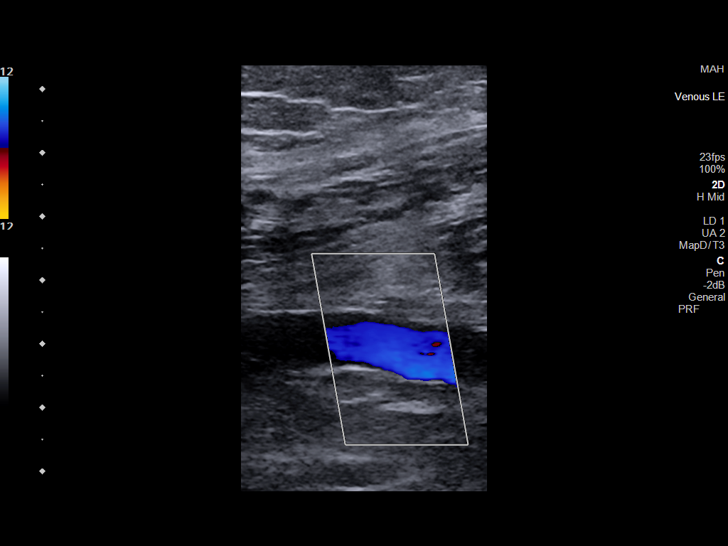
[im 22/42]
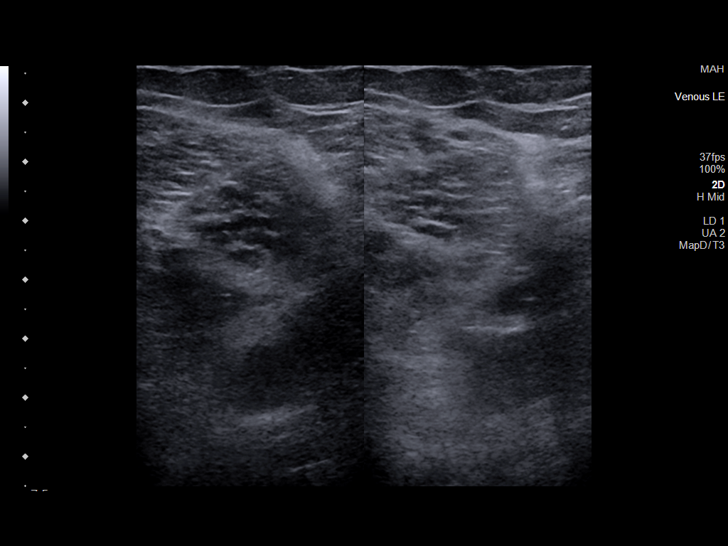
[im 25/42]
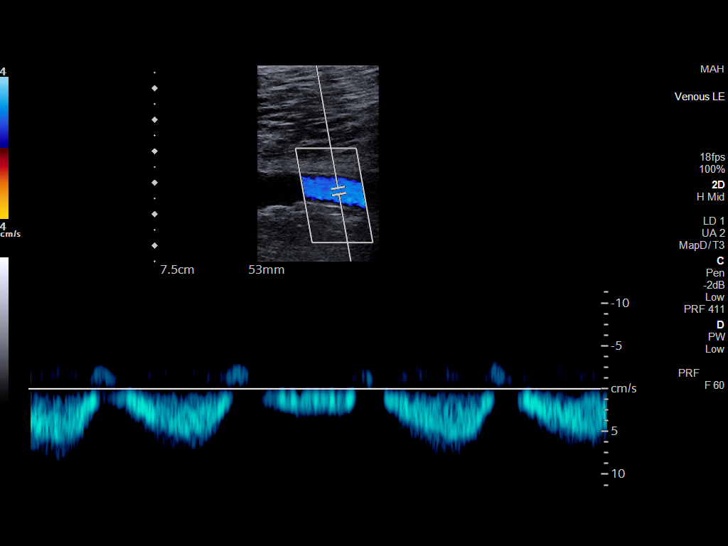
[im 29/42]
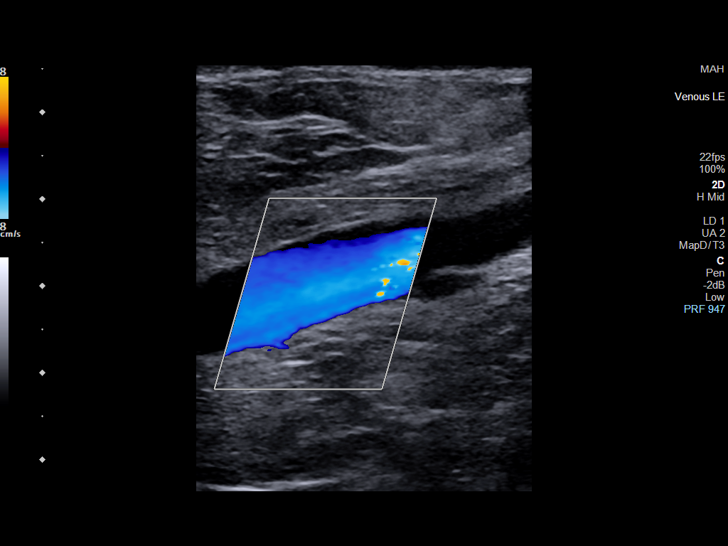
[im 33/42]
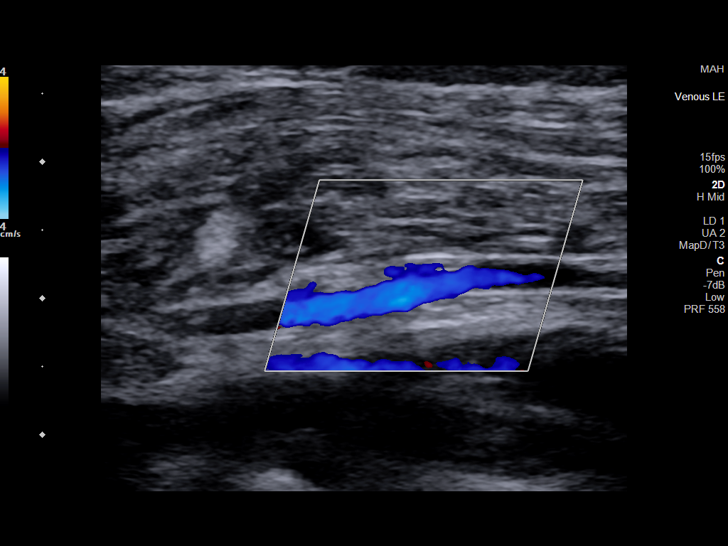
[im 34/42]
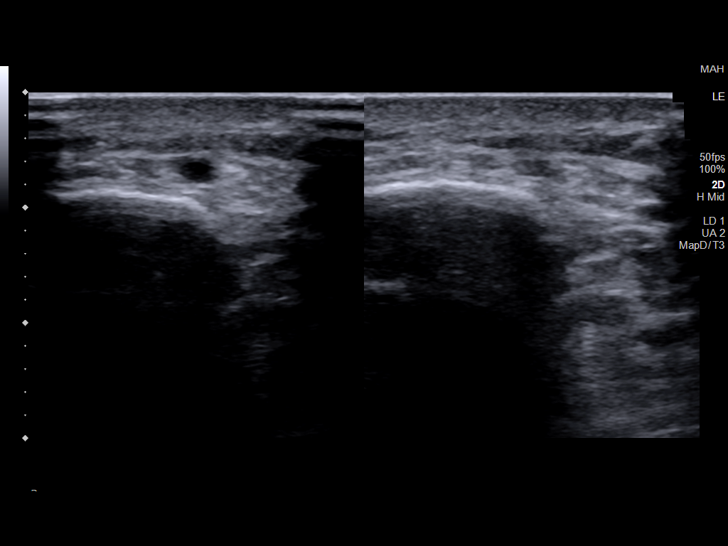
[im 38/42]
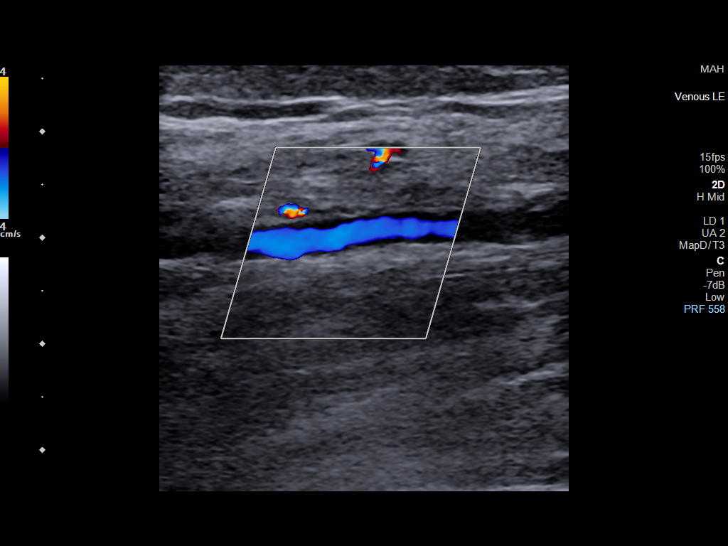
[im 42/42]
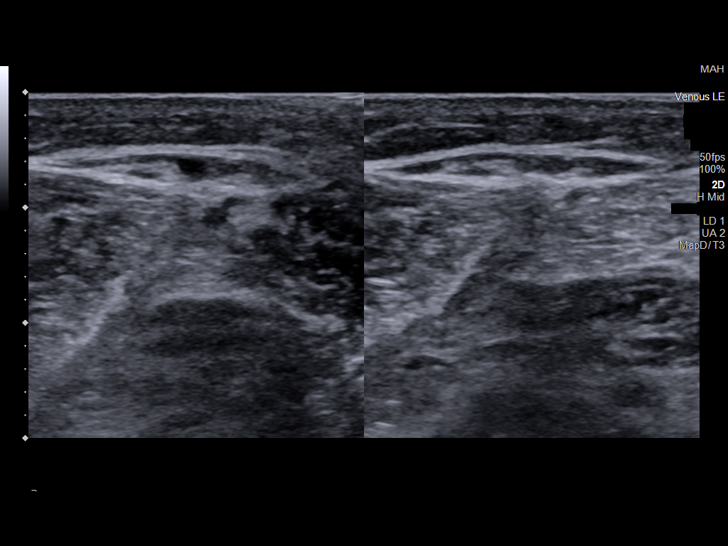

[14 of 24 positions shown; findings below may reference images not displayed]

FINDINGS: VENOUS

Normal compressibility of the common femoral, superficial femoral,
and popliteal veins, as well as the visualized calf veins.
Visualized portions of profunda femoral vein and great saphenous
vein unremarkable. No filling defects to suggest DVT on grayscale or
color Doppler imaging. Doppler waveforms show normal direction of
venous flow, normal respiratory plasticity and response to
augmentation.

Limited views of the contralateral common femoral vein are
unremarkable.

OTHER

No evidence of superficial thrombophlebitis or abnormal fluid
collection.

Limitations: none
IMPRESSION: No evidence of femoropopliteal DVT within the RIGHT lower extremity.

## 2021-07-14 DIAGNOSIS — E039 Hypothyroidism, unspecified: Secondary | ICD-10-CM | POA: Diagnosis not present

## 2021-07-14 DIAGNOSIS — E782 Mixed hyperlipidemia: Secondary | ICD-10-CM | POA: Diagnosis not present

## 2021-07-14 DIAGNOSIS — M1990 Unspecified osteoarthritis, unspecified site: Secondary | ICD-10-CM | POA: Diagnosis not present

## 2021-07-14 DIAGNOSIS — E1142 Type 2 diabetes mellitus with diabetic polyneuropathy: Secondary | ICD-10-CM | POA: Diagnosis not present

## 2021-07-14 DIAGNOSIS — I10 Essential (primary) hypertension: Secondary | ICD-10-CM | POA: Diagnosis not present

## 2021-07-14 DIAGNOSIS — E1140 Type 2 diabetes mellitus with diabetic neuropathy, unspecified: Secondary | ICD-10-CM | POA: Diagnosis not present

## 2021-07-18 DIAGNOSIS — K5730 Diverticulosis of large intestine without perforation or abscess without bleeding: Secondary | ICD-10-CM | POA: Diagnosis not present

## 2021-07-18 DIAGNOSIS — D125 Benign neoplasm of sigmoid colon: Secondary | ICD-10-CM | POA: Diagnosis not present

## 2021-07-18 DIAGNOSIS — K648 Other hemorrhoids: Secondary | ICD-10-CM | POA: Diagnosis not present

## 2021-07-18 DIAGNOSIS — Z86010 Personal history of colonic polyps: Secondary | ICD-10-CM | POA: Diagnosis not present

## 2021-07-20 DIAGNOSIS — D125 Benign neoplasm of sigmoid colon: Secondary | ICD-10-CM | POA: Diagnosis not present

## 2021-07-29 DIAGNOSIS — Z8546 Personal history of malignant neoplasm of prostate: Secondary | ICD-10-CM | POA: Diagnosis not present

## 2021-08-05 DIAGNOSIS — N5201 Erectile dysfunction due to arterial insufficiency: Secondary | ICD-10-CM | POA: Diagnosis not present

## 2021-08-05 DIAGNOSIS — R350 Frequency of micturition: Secondary | ICD-10-CM | POA: Diagnosis not present

## 2021-08-05 DIAGNOSIS — N401 Enlarged prostate with lower urinary tract symptoms: Secondary | ICD-10-CM | POA: Diagnosis not present

## 2021-08-05 DIAGNOSIS — Z8546 Personal history of malignant neoplasm of prostate: Secondary | ICD-10-CM | POA: Diagnosis not present

## 2021-08-16 ENCOUNTER — Telehealth: Payer: Self-pay | Admitting: Internal Medicine

## 2021-08-16 DIAGNOSIS — E7849 Other hyperlipidemia: Secondary | ICD-10-CM

## 2021-08-16 NOTE — Telephone Encounter (Signed)
Lipid panel order/mailed to patient to be completed prior to next visit in Feb 2023

## 2021-08-22 DIAGNOSIS — F419 Anxiety disorder, unspecified: Secondary | ICD-10-CM | POA: Diagnosis not present

## 2021-09-16 DIAGNOSIS — E1140 Type 2 diabetes mellitus with diabetic neuropathy, unspecified: Secondary | ICD-10-CM | POA: Diagnosis not present

## 2021-09-16 DIAGNOSIS — E782 Mixed hyperlipidemia: Secondary | ICD-10-CM | POA: Diagnosis not present

## 2021-09-16 DIAGNOSIS — I10 Essential (primary) hypertension: Secondary | ICD-10-CM | POA: Diagnosis not present

## 2021-09-16 DIAGNOSIS — E1142 Type 2 diabetes mellitus with diabetic polyneuropathy: Secondary | ICD-10-CM | POA: Diagnosis not present

## 2021-09-16 DIAGNOSIS — E039 Hypothyroidism, unspecified: Secondary | ICD-10-CM | POA: Diagnosis not present

## 2021-09-16 DIAGNOSIS — M1990 Unspecified osteoarthritis, unspecified site: Secondary | ICD-10-CM | POA: Diagnosis not present

## 2021-10-06 DIAGNOSIS — E7849 Other hyperlipidemia: Secondary | ICD-10-CM | POA: Diagnosis not present

## 2021-10-06 LAB — LIPID PANEL
Chol/HDL Ratio: 1.6 ratio (ref 0.0–5.0)
Cholesterol, Total: 109 mg/dL (ref 100–199)
HDL: 67 mg/dL (ref >39)
LDL Chol Calc (NIH): 21 mg/dL (ref 0–99)
Triglycerides: 124 mg/dL (ref 0–149)
VLDL Cholesterol Cal: 21 mg/dL (ref 5–40)

## 2021-10-12 ENCOUNTER — Other Ambulatory Visit: Payer: Self-pay | Admitting: Physician Assistant

## 2021-10-25 ENCOUNTER — Encounter (HOSPITAL_BASED_OUTPATIENT_CLINIC_OR_DEPARTMENT_OTHER): Payer: Self-pay | Admitting: Internal Medicine

## 2021-10-25 ENCOUNTER — Other Ambulatory Visit (HOSPITAL_BASED_OUTPATIENT_CLINIC_OR_DEPARTMENT_OTHER): Payer: Self-pay | Admitting: *Deleted

## 2021-10-25 ENCOUNTER — Other Ambulatory Visit: Payer: Self-pay

## 2021-10-25 ENCOUNTER — Ambulatory Visit (HOSPITAL_BASED_OUTPATIENT_CLINIC_OR_DEPARTMENT_OTHER): Payer: Medicare HMO | Admitting: Internal Medicine

## 2021-10-25 VITALS — BP 118/68 | HR 62 | Ht 69.0 in | Wt 240.8 lb

## 2021-10-25 DIAGNOSIS — R0602 Shortness of breath: Secondary | ICD-10-CM | POA: Diagnosis not present

## 2021-10-25 DIAGNOSIS — I2510 Atherosclerotic heart disease of native coronary artery without angina pectoris: Secondary | ICD-10-CM

## 2021-10-25 DIAGNOSIS — E7849 Other hyperlipidemia: Secondary | ICD-10-CM | POA: Diagnosis not present

## 2021-10-25 NOTE — Progress Notes (Signed)
LIPID CLINIC CONSULT NOTE  Chief Complaint:  Follow-up  Primary Care Physician: Aurea Graff, PA-C (Inactive)  Primary Cardiologist:  None  HPI:  Kailer Heindel is a 76 y.o. male who is being seen today for the evaluation of dyslipidemia at the request of Sammuel Hines, PA-C. This is a 76 year old male with a history of dyslipidemia and recent diagnosis of prostate cancer status post radiation therapy.  He is about a month out from that.  He was referred for persistent dyslipidemia and risk factor modification.  Recent labs in September 2020 showed total cholesterol 342, triglycerides 394, LDL of 216 and HDL 47.  This is on atorvastatin 80 mg daily which she reports being compliant with.  He says that he has a pretty atherogenic diet.  He eats a lot of sausage and gravy biscuits and other high-fat foods which she has been trying to work on.  Of note that he does have a significant family history of heart disease, concerning that both his oldest and youngest brothers had coronary artery bypass grafting x4.  His mother also had heart disease and high cholesterol.  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  Moreover, today he is reported progressive fatigue and dyspnea.  This started actually prior to his radiation therapy for the prostate however of course intermittently worsened somewhat although is improving.  He says that doing outdoor work such as clean up stuff with his chainsaw and other things in the backyard he is noted progressive fatigue, weakness and exercise intolerance.  This is probably been over the past 6 months to a year.  He denies any chest pain but does get short of breath with exertion.  He reports a very remote stress test but has not been unable to exercise due to knee osteoarthritis.  01/05/2020  Returns today for follow-up of his CT coronary angiogram which I personally reviewed on 12/12/2019.  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  Coronary calcium score was 250, not 56 percentile for age and sex matched control.  I discussed the findings with him today and felt that we could not necessarily exclude ischemia.  He still reports some persistent dyspnea on exertion.  I recommended starting aspirin 81 mg daily and suggested that we should consider functional testing with another modality such as a Myoview.  1/27/20202  Ron is seen today in follow-up for dyslipidemia.  He he had a virtual visit over the summer.  He is cholesterol on Repatha had resulted in negative value.  I then decreased his atorvastatin from 80 to 40 mg then down to 20 and now 10 mg daily.  He had repeat labs in December 2021 demonstrating a total cholesterol of 92, triglycerides 109, HDL 57 and LDL of 15.  He seems to be tolerating the combination of medications.  He reports that the Repatha is affordable through [facility] at $45 a month.  10/25/2021  Mr. Zaino returns today for follow-up.  Overall he says he is doing well though recently says he has been more short of breath.  He was doing a lot of work around the house.  He is actually gained about 20 pounds and he is attributing this to his shortness of breath.  He denies any chest pain.  His blood pressure is well controlled today.  His lipids continue to be well managed.  Total cholesterol  109, HDL 67, triglycerides 124 and LDL 21.  PMHx:  Past Medical History:  Diagnosis Date   Depression    DM2 (diabetes mellitus, type 2) ([place])    Dyslipidemia    Hypertension    OA (osteoarthritis) of knee    Prostate cancer ([place])    prostate    Past Surgical History:  Procedure Laterality Date   INGUINAL HERNIA REPAIR     PROSTATE BIOPSY     TOTAL KNEE ARTHROPLASTY Left 10/26/2020   Procedure: TOTAL KNEE ARTHROPLASTY;  Surgeon: Paralee Cancel, MD;  Location: [facility];  Service: Orthopedics;  Laterality:  Left;  70 mins   TOTAL KNEE ARTHROPLASTY Right 12/21/2020   Procedure: TOTAL KNEE ARTHROPLASTY;  Surgeon: Paralee Cancel, MD;  Location: [facility];  Service: Orthopedics;  Laterality: Right;  70 mins    FAMHx:  Family History  Problem Relation Age of Onset   Pancreatic cancer Sister    Other Sister        brain tumor   CAD Mother    CAD Brother        CABGx4    CAD Brother        CABGx4   Colon cancer Neg Hx    Breast cancer Neg Hx     SOCHx:   reports that he quit smoking about 42 years ago. His smoking use included cigarettes. He has a 24.00 pack-year smoking history. He has never used smokeless tobacco. He reports current alcohol use. He reports that he does not use drugs.  ALLERGIES:  No Known Allergies  ROS: Pertinent items noted in HPI and remainder of comprehensive ROS otherwise negative.  HOME MEDS: Current Outpatient Medications on File Prior to Visit  Medication Sig Dispense Refill   ALPRAZolam (XANAX) 1 MG tablet Take 1 mg by mouth at bedtime.     aspirin 81 MG chewable tablet aspirin 81 mg chewable tablet  CHEW 1 TABLET (81 MG TOTAL) BY MOUTH IN THE MORNING AND AT BEDTIME FOR 28 DAYS.     atorvastatin (LIPITOR) 10 MG tablet Take 10 mg by mouth every evening.     baclofen (LIORESAL) 10 MG tablet Take 10 mg by mouth 3 (three) times daily.     Blood Glucose Monitoring Suppl (TRUE METRIX AIR GLUCOSE METER) DEVI True Metrix Air Glucose Meter kit     celecoxib (CELEBREX) 200 MG capsule Take 200 mg by mouth 2 (two) times daily.     Cholecalciferol (VITAMIN D3) 50 MCG (2000 UT) TABS Take 2,000 Units by mouth every evening.     Coenzyme Q10-Vitamin E (QUNOL ULTRA COQ10 PO) Take 1 capsule by mouth at bedtime.     docusate sodium (COLACE) 100 MG capsule Take 1 capsule (100 mg total) by mouth 2 (two) times daily.     DULoxetine (CYMBALTA) 60 MG capsule Take 60 mg by mouth every evening.     ferrous sulfate 325 (65 FE) MG tablet ferrous sulfate 325 mg (65 mg iron) tablet  TAKE 1  TABLET 3 TIMES A DAY BY ORAL ROUTE FOR 2-3 WEEKS.     glimepiride (AMARYL) 4 MG tablet Take 4 mg by mouth daily with breakfast.     glucose blood (TRUE METRIX BLOOD GLUCOSE TEST) test strip True Metrix Glucose Test Strip     levothyroxine (SYNTHROID) 100 MCG tablet Take 100 mcg by mouth daily before breakfast.     losartan (COZAAR) 25 MG tablet Take 12.5 mg by mouth every evening.     metFORMIN (GLUCOPHAGE) 1000  MG tablet Take 1,000 mg by mouth in the morning and at bedtime.     methocarbamol (ROBAXIN) 500 MG tablet Take 1 tablet (500 mg total) by mouth every 6 (six) hours as needed for muscle spasms. 40 tablet 0   Multiple Vitamin (MULTIVITAMIN WITH MINERALS) TABS tablet Take 1 tablet by mouth daily. Centrum Silver     pioglitazone (ACTOS) 15 MG tablet Take 15 mg by mouth daily.     polyethylene glycol (MIRALAX / GLYCOLAX) 17 g packet Take 17 g by mouth 2 (two) times daily. 14 each 0   REPATHA SURECLICK 948 MG/ML SOAJ INJECT 140MG SUBCUTANEOUSLY EVERY 2 WEEKS (14 DAYS) AS DIRECTED 6 mL 3   sitaGLIPtin (JANUVIA) 50 MG tablet Take 50 mg by mouth every evening.     tamsulosin (FLOMAX) 0.4 MG CAPS capsule Take 0.8 mg by mouth daily.     TRUEplus Lancets 33G MISC TRUEplus Lancets 33 gauge     Turmeric Curcumin 500 MG CAPS Take 500 mg by mouth daily.     No current facility-administered medications on file prior to visit.    LABS/IMAGING: No results found for this or any previous visit (from the past 48 hour(s)). No results found.  LIPID PANEL:    Component Value Date/Time   CHOL 109 10/06/2021 1132   TRIG 124 10/06/2021 1132   HDL 67 10/06/2021 1132   CHOLHDL 1.6 10/06/2021 1132   LDLCALC 21 10/06/2021 1132    WEIGHTS: Wt Readings from Last 3 Encounters:  10/25/21 240 lb 12.8 oz (109.2 kg)  12/21/20 220 lb (99.8 kg)  12/15/20 220 lb (99.8 kg)    VITALS: Ht 5' 9"  (1.753 m)    Wt 240 lb 12.8 oz (109.2 kg)    BMI 35.56 kg/m   EXAM: General appearance: alert and no  distress Lungs: clear to auscultation bilaterally Heart: regular rate and rhythm Extremities: extremities normal, atraumatic, no cyanosis or edema Neurologic: Grossly normal  EKG: Deferred  ASSESSMENT: Progressive fatigue/dyspnea-abnormal CT coronary angiogram with 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  FFR could not be performed due to artifact and there was loss of the mid to distal LAD. Marked dyslipidemia, suggestive of familial hyperlipidemia Type 2 diabetes Hypertension Moderate obesity Prostate cancer status post radiation therapy Family history of premature onset coronary disease  PLAN: 1.   Mr. Guardado did report some dyspnea on exertion today.  He has reported this before and was found to have some mild to moderate nonobstructive coronary disease on CT coronary angiography.  He thinks it was related to weight gain and there has been significant weight gain over the past couple months.  He is going to be working on that and I encouraged him to monitor his symptoms closely.  If his symptoms worsen or he develops any chest pain associated with that he should contact me for further testing.  Lipids are well controlled.  No changes to his medicines.  I would continue with low-dose atorvastatin in addition to his Repatha.  Plan follow-up with me annually or sooner as necessary  Pixie Casino, MD, [facility], [place] Director of the Advanced Lipid Disorders &  Cardiovascular Risk Reduction Clinic Diplomate of the American Board of Clinical Lipidology Attending Cardiologist  Direct Dial: [phone]   Fax: [phone]  Website:  [email or web address] 10/25/2021, 11:20 AM

## 2021-10-25 NOTE — Patient Instructions (Signed)
Medication Instructions:  Your physician recommends that you continue on your current medications as directed. Please refer to the Current Medication list given to you today.  *If you need a refill on your cardiac medications before your next appointment, please call your pharmacy*   Lab Work: 1 [place], COME FOR FASTING LIPID  If you have labs (blood work) drawn today and your tests are completely normal, you will receive your results only by: [place] (if you have MyChart) OR A paper copy in the mail If you have any lab test that is abnormal or we need to change your treatment, we will call you to review the results.   Testing/Procedures: None ordered   Follow-Up: At [facility], you and your health needs are our priority.  As part of our continuing mission to provide you with exceptional heart care, we have created designated Provider Care Teams.  These Care Teams include your primary Cardiologist (physician) and Advanced Practice Providers (APPs -  Physician Assistants and Nurse Practitioners) who all work together to provide you with the care you need, when you need it.  We recommend signing up for the patient portal called "MyChart".  Sign up information is provided on this After Visit Summary.  MyChart is used to connect with patients for Virtual Visits (Telemedicine).  Patients are able to view lab/test results, encounter notes, upcoming appointments, etc.  Non-urgent messages can be sent to your provider as well.   To learn more about what you can do with MyChart, go to [email or web address].    Your next appointment:   12 month(s)  The format for your next appointment:   In Person  Provider:   K. Mali Hilty, MD    Other Instructions

## 2021-11-22 DIAGNOSIS — R0602 Shortness of breath: Secondary | ICD-10-CM | POA: Diagnosis not present

## 2021-12-13 DIAGNOSIS — E782 Mixed hyperlipidemia: Secondary | ICD-10-CM | POA: Diagnosis not present

## 2021-12-13 DIAGNOSIS — E1140 Type 2 diabetes mellitus with diabetic neuropathy, unspecified: Secondary | ICD-10-CM | POA: Diagnosis not present

## 2021-12-13 DIAGNOSIS — I10 Essential (primary) hypertension: Secondary | ICD-10-CM | POA: Diagnosis not present

## 2021-12-29 DIAGNOSIS — R0602 Shortness of breath: Secondary | ICD-10-CM | POA: Diagnosis not present

## 2022-02-02 DIAGNOSIS — Z7984 Long term (current) use of oral hypoglycemic drugs: Secondary | ICD-10-CM | POA: Diagnosis not present

## 2022-02-02 DIAGNOSIS — E119 Type 2 diabetes mellitus without complications: Secondary | ICD-10-CM | POA: Diagnosis not present

## 2022-02-02 DIAGNOSIS — H524 Presbyopia: Secondary | ICD-10-CM | POA: Diagnosis not present

## 2022-02-02 DIAGNOSIS — H52203 Unspecified astigmatism, bilateral: Secondary | ICD-10-CM | POA: Diagnosis not present

## 2022-02-02 DIAGNOSIS — Z961 Presence of intraocular lens: Secondary | ICD-10-CM | POA: Diagnosis not present

## 2022-03-09 DIAGNOSIS — Z125 Encounter for screening for malignant neoplasm of prostate: Secondary | ICD-10-CM | POA: Diagnosis not present

## 2022-03-09 DIAGNOSIS — Z0000 Encounter for general adult medical examination without abnormal findings: Secondary | ICD-10-CM | POA: Diagnosis not present

## 2022-03-09 DIAGNOSIS — I10 Essential (primary) hypertension: Secondary | ICD-10-CM | POA: Diagnosis not present

## 2022-03-09 DIAGNOSIS — E1140 Type 2 diabetes mellitus with diabetic neuropathy, unspecified: Secondary | ICD-10-CM | POA: Diagnosis not present

## 2022-03-09 DIAGNOSIS — E039 Hypothyroidism, unspecified: Secondary | ICD-10-CM | POA: Diagnosis not present

## 2022-03-09 DIAGNOSIS — D649 Anemia, unspecified: Secondary | ICD-10-CM | POA: Diagnosis not present

## 2022-03-09 DIAGNOSIS — E785 Hyperlipidemia, unspecified: Secondary | ICD-10-CM | POA: Diagnosis not present

## 2022-03-09 DIAGNOSIS — Z131 Encounter for screening for diabetes mellitus: Secondary | ICD-10-CM | POA: Diagnosis not present

## 2022-03-10 ENCOUNTER — Telehealth: Payer: Self-pay | Admitting: Hematology and Oncology

## 2022-03-16 DIAGNOSIS — D649 Anemia, unspecified: Secondary | ICD-10-CM | POA: Diagnosis not present

## 2022-03-17 ENCOUNTER — Encounter (HOSPITAL_COMMUNITY): Payer: Self-pay

## 2022-03-17 ENCOUNTER — Inpatient Hospital Stay (HOSPITAL_COMMUNITY)
Admission: EM | Admit: 2022-03-17 | Discharge: 2022-03-19 | DRG: 812 | Disposition: A | Discharge status: Home / Self Care | Payer: Medicare HMO | Attending: Family Medicine | Admitting: Family Medicine

## 2022-03-17 ENCOUNTER — Other Ambulatory Visit: Payer: Self-pay

## 2022-03-17 DIAGNOSIS — K259 Gastric ulcer, unspecified as acute or chronic, without hemorrhage or perforation: Secondary | ICD-10-CM | POA: Diagnosis present

## 2022-03-17 DIAGNOSIS — E785 Hyperlipidemia, unspecified: Secondary | ICD-10-CM | POA: Diagnosis present

## 2022-03-17 DIAGNOSIS — Z87891 Personal history of nicotine dependence: Secondary | ICD-10-CM

## 2022-03-17 DIAGNOSIS — M1990 Unspecified osteoarthritis, unspecified site: Secondary | ICD-10-CM | POA: Diagnosis not present

## 2022-03-17 DIAGNOSIS — Z79899 Other long term (current) drug therapy: Secondary | ICD-10-CM

## 2022-03-17 DIAGNOSIS — K319 Disease of stomach and duodenum, unspecified: Secondary | ICD-10-CM | POA: Diagnosis not present

## 2022-03-17 DIAGNOSIS — K449 Diaphragmatic hernia without obstruction or gangrene: Secondary | ICD-10-CM | POA: Diagnosis not present

## 2022-03-17 DIAGNOSIS — N400 Enlarged prostate without lower urinary tract symptoms: Secondary | ICD-10-CM | POA: Diagnosis present

## 2022-03-17 DIAGNOSIS — D509 Iron deficiency anemia, unspecified: Principal | ICD-10-CM

## 2022-03-17 DIAGNOSIS — K2289 Other specified disease of esophagus: Secondary | ICD-10-CM | POA: Diagnosis not present

## 2022-03-17 DIAGNOSIS — D649 Anemia, unspecified: Secondary | ICD-10-CM | POA: Diagnosis present

## 2022-03-17 DIAGNOSIS — Z860100 Personal history of colon polyps, unspecified: Secondary | ICD-10-CM

## 2022-03-17 DIAGNOSIS — Z8249 Family history of ischemic heart disease and other diseases of the circulatory system: Secondary | ICD-10-CM

## 2022-03-17 DIAGNOSIS — M25561 Pain in right knee: Secondary | ICD-10-CM | POA: Diagnosis not present

## 2022-03-17 DIAGNOSIS — Z96653 Presence of artificial knee joint, bilateral: Secondary | ICD-10-CM | POA: Diagnosis present

## 2022-03-17 DIAGNOSIS — E039 Hypothyroidism, unspecified: Secondary | ICD-10-CM | POA: Diagnosis not present

## 2022-03-17 DIAGNOSIS — M25562 Pain in left knee: Secondary | ICD-10-CM | POA: Diagnosis present

## 2022-03-17 DIAGNOSIS — M48061 Spinal stenosis, lumbar region without neurogenic claudication: Secondary | ICD-10-CM | POA: Diagnosis present

## 2022-03-17 DIAGNOSIS — E1165 Type 2 diabetes mellitus with hyperglycemia: Secondary | ICD-10-CM | POA: Diagnosis present

## 2022-03-17 DIAGNOSIS — D500 Iron deficiency anemia secondary to blood loss (chronic): Secondary | ICD-10-CM | POA: Diagnosis not present

## 2022-03-17 DIAGNOSIS — Z7982 Long term (current) use of aspirin: Secondary | ICD-10-CM | POA: Diagnosis not present

## 2022-03-17 DIAGNOSIS — Z800 Family history of malignant neoplasm of digestive organs: Secondary | ICD-10-CM

## 2022-03-17 DIAGNOSIS — Z96652 Presence of left artificial knee joint: Secondary | ICD-10-CM | POA: Diagnosis not present

## 2022-03-17 DIAGNOSIS — E669 Obesity, unspecified: Secondary | ICD-10-CM | POA: Diagnosis present

## 2022-03-17 DIAGNOSIS — R9431 Abnormal electrocardiogram [ECG] [EKG]: Secondary | ICD-10-CM | POA: Diagnosis not present

## 2022-03-17 DIAGNOSIS — K5790 Diverticulosis of intestine, part unspecified, without perforation or abscess without bleeding: Secondary | ICD-10-CM | POA: Diagnosis present

## 2022-03-17 DIAGNOSIS — Z7984 Long term (current) use of oral hypoglycemic drugs: Secondary | ICD-10-CM | POA: Diagnosis not present

## 2022-03-17 DIAGNOSIS — Z8719 Personal history of other diseases of the digestive system: Secondary | ICD-10-CM

## 2022-03-17 DIAGNOSIS — Z8546 Personal history of malignant neoplasm of prostate: Secondary | ICD-10-CM

## 2022-03-17 DIAGNOSIS — K2270 Barrett's esophagus without dysplasia: Secondary | ICD-10-CM | POA: Diagnosis present

## 2022-03-17 DIAGNOSIS — Z79890 Hormone replacement therapy (postmenopausal): Secondary | ICD-10-CM

## 2022-03-17 DIAGNOSIS — Z96651 Presence of right artificial knee joint: Secondary | ICD-10-CM

## 2022-03-17 DIAGNOSIS — F32A Depression, unspecified: Secondary | ICD-10-CM | POA: Diagnosis present

## 2022-03-17 DIAGNOSIS — Z6835 Body mass index (BMI) 35.0-35.9, adult: Secondary | ICD-10-CM | POA: Diagnosis not present

## 2022-03-17 DIAGNOSIS — C61 Malignant neoplasm of prostate: Secondary | ICD-10-CM | POA: Diagnosis present

## 2022-03-17 DIAGNOSIS — G8929 Other chronic pain: Secondary | ICD-10-CM | POA: Diagnosis present

## 2022-03-17 DIAGNOSIS — F418 Other specified anxiety disorders: Secondary | ICD-10-CM | POA: Diagnosis not present

## 2022-03-17 DIAGNOSIS — E119 Type 2 diabetes mellitus without complications: Secondary | ICD-10-CM | POA: Diagnosis not present

## 2022-03-17 DIAGNOSIS — D62 Acute posthemorrhagic anemia: Secondary | ICD-10-CM | POA: Diagnosis not present

## 2022-03-17 DIAGNOSIS — I2510 Atherosclerotic heart disease of native coronary artery without angina pectoris: Secondary | ICD-10-CM | POA: Diagnosis present

## 2022-03-17 DIAGNOSIS — F419 Anxiety disorder, unspecified: Secondary | ICD-10-CM | POA: Diagnosis present

## 2022-03-17 DIAGNOSIS — I10 Essential (primary) hypertension: Secondary | ICD-10-CM | POA: Diagnosis present

## 2022-03-17 LAB — COMPREHENSIVE METABOLIC PANEL
ALT: 11 U/L (ref 0–44)
AST: 18 U/L (ref 15–41)
Albumin: 3.9 g/dL (ref 3.5–5.0)
Alkaline Phosphatase: 72 U/L (ref 38–126)
Anion gap: 8 (ref 5–15)
BUN: 22 mg/dL (ref 8–23)
CO2: 23 mmol/L (ref 22–32)
Calcium: 9.4 mg/dL (ref 8.9–10.3)
Chloride: 109 mmol/L (ref 98–111)
Creatinine, Ser: 0.82 mg/dL (ref 0.61–1.24)
GFR, Estimated: >60 mL/min (ref >60)
Glucose, Bld: 80 mg/dL (ref 70–99)
Potassium: 4.2 mmol/L (ref 3.5–5.1)
Sodium: 140 mmol/L (ref 135–145)
Total Bilirubin: 1 mg/dL (ref 0.3–1.2)
Total Protein: 7.1 g/dL (ref 6.5–8.1)

## 2022-03-17 LAB — CBC WITH DIFFERENTIAL/PLATELET
Abs Immature Granulocytes: 0.01 10*3/uL (ref 0.00–0.07)
Basophils Absolute: 0.1 10*3/uL (ref 0.0–0.1)
Basophils Relative: 1 %
Eosinophils Absolute: 0.2 10*3/uL (ref 0.0–0.5)
Eosinophils Relative: 3 %
HCT: 23.7 % — ABNORMAL LOW (ref 39.0–52.0)
Hemoglobin: 7 g/dL — ABNORMAL LOW (ref 13.0–17.0)
Immature Granulocytes: 0 %
Lymphocytes Relative: 25 %
Lymphs Abs: 1.3 10*3/uL (ref 0.7–4.0)
MCH: 24.7 pg — ABNORMAL LOW (ref 26.0–34.0)
MCHC: 29.5 g/dL — ABNORMAL LOW (ref 30.0–36.0)
MCV: 83.7 fL (ref 80.0–100.0)
Monocytes Absolute: 0.5 10*3/uL (ref 0.1–1.0)
Monocytes Relative: 11 %
Neutro Abs: 2.9 10*3/uL (ref 1.7–7.7)
Neutrophils Relative %: 60 %
Platelets: 289 10*3/uL (ref 150–400)
RBC: 2.83 MIL/uL — ABNORMAL LOW (ref 4.22–5.81)
RDW: 15.2 % (ref 11.5–15.5)
WBC: 4.9 10*3/uL (ref 4.0–10.5)
nRBC: 0 % (ref 0.0–0.2)

## 2022-03-17 LAB — GLUCOSE, CAPILLARY: Glucose-Capillary: 90 mg/dL (ref 70–99)

## 2022-03-17 LAB — PREPARE RBC (CROSSMATCH)

## 2022-03-17 LAB — HEMOGLOBIN A1C
Hgb A1c MFr Bld: 6.3 % — ABNORMAL HIGH (ref 4.8–5.6)
Mean Plasma Glucose: 134.11 mg/dL

## 2022-03-17 MED ORDER — LEVOTHYROXINE SODIUM 100 MCG PO TABS
100.0000 ug | ORAL_TABLET | Freq: Every day | ORAL | Status: DC
  Administered 2022-03-18: 100 ug via ORAL
  Filled 2022-03-17 (×2): qty 1

## 2022-03-17 MED ORDER — FERROUS SULFATE 325 (65 FE) MG PO TABS
325.0000 mg | ORAL_TABLET | Freq: Three times a day (TID) | ORAL | Status: DC
  Administered 2022-03-18 – 2022-03-19 (×2): 325 mg via ORAL
  Filled 2022-03-17 (×2): qty 1

## 2022-03-17 MED ORDER — MELATONIN 5 MG PO TABS: 5.0000 mg | ORAL_TABLET | Freq: Every evening | ORAL | Status: DC | PRN

## 2022-03-17 MED ORDER — ALPRAZOLAM 1 MG PO TABS
1.0000 mg | ORAL_TABLET | Freq: Every day | ORAL | Status: DC
  Administered 2022-03-17 – 2022-03-18 (×2): 1 mg via ORAL
  Filled 2022-03-17 (×2): qty 2

## 2022-03-17 MED ORDER — INSULIN ASPART 100 UNIT/ML IJ SOLN
0.0000 [IU] | Freq: Three times a day (TID) | INTRAMUSCULAR | Status: DC
  Administered 2022-03-18: 5 [IU] via SUBCUTANEOUS
  Administered 2022-03-18 – 2022-03-19 (×4): 2 [IU] via SUBCUTANEOUS

## 2022-03-17 MED ORDER — ATORVASTATIN CALCIUM 10 MG PO TABS
10.0000 mg | ORAL_TABLET | Freq: Every evening | ORAL | Status: DC
  Administered 2022-03-17 – 2022-03-18 (×2): 10 mg via ORAL
  Filled 2022-03-17 (×2): qty 1

## 2022-03-17 MED ORDER — POLYETHYLENE GLYCOL 3350 17 G PO PACK: 17.0000 g | PACK | Freq: Every day | ORAL | Status: DC | PRN

## 2022-03-17 MED ORDER — INSULIN ASPART 100 UNIT/ML IJ SOLN: 0.0000 [IU] | Freq: Every day | INTRAMUSCULAR | Status: DC

## 2022-03-17 MED ORDER — ACETAMINOPHEN 325 MG PO TABS: 650.0000 mg | ORAL_TABLET | Freq: Four times a day (QID) | ORAL | Status: DC | PRN

## 2022-03-17 MED ORDER — SODIUM CHLORIDE 0.9 % IV SOLN
10.0000 mL/h | Freq: Once | INTRAVENOUS | Status: AC
  Administered 2022-03-17: 10 mL/h via INTRAVENOUS

## 2022-03-17 MED ORDER — LACTATED RINGERS IV SOLN: INTRAVENOUS | Status: DC

## 2022-03-17 MED ORDER — PANTOPRAZOLE SODIUM 40 MG IV SOLR
40.0000 mg | Freq: Two times a day (BID) | INTRAVENOUS | Status: DC
  Administered 2022-03-17 – 2022-03-19 (×4): 40 mg via INTRAVENOUS
  Filled 2022-03-17 (×4): qty 10

## 2022-03-17 MED ORDER — ONDANSETRON HCL 4 MG/2ML IJ SOLN: 4.0000 mg | Freq: Four times a day (QID) | INTRAMUSCULAR | Status: DC | PRN

## 2022-03-17 MED ORDER — DULOXETINE HCL 60 MG PO CPEP
60.0000 mg | ORAL_CAPSULE | Freq: Every evening | ORAL | Status: DC
  Administered 2022-03-17 – 2022-03-18 (×2): 60 mg via ORAL
  Filled 2022-03-17 (×2): qty 2

## 2022-03-17 MED ORDER — TAMSULOSIN HCL 0.4 MG PO CAPS
0.8000 mg | ORAL_CAPSULE | Freq: Every morning | ORAL | Status: DC
  Administered 2022-03-18 – 2022-03-19 (×2): 0.8 mg via ORAL
  Filled 2022-03-17 (×2): qty 2

## 2022-03-17 NOTE — H&P (Incomplete)
History and Physical  Kevin Olson QZR:[id number] DOB: 1945/12/19 DOA: 03/17/2022  Referring physician: Dr. Zenia Resides, Kevin Olson  PCP: Kevin Olson (Inactive)  Outpatient Specialists: Oncology, opthalmology, cardiology. Patient coming from: Home through PCP's office  Chief Complaint: Abnormal labs   HPI: Kevin Olson is a 76 y.o. male with medical history significant for essential hypertension, hyperlipidemia, hypothyroidism, type 2 diabetes, iron deficiency anemia, chronic anxiety/depression, BPH, who presented to [facility] ED at the recommendation of his PCP due to abnormal labs with hemoglobin of 6.7K.  Denies hematemesis, melena or hematochezia.  He is on daily aspirin 81 mg and daily Celebrex.    Upon presentation to the ED, repeated hemoglobin 7.0.  BP stable, not hypotensive.  2 units PRBCs ordered by EDP to be transfused.  [facility] consulted by EDP.  TRH, hospitalist service, asked to admit.  ED Course: Tmax 98.1.  BP 152/75, pulse 62, respiratory 14, O2 saturation 97% on room air.  Lab studies remarkable for hemoglobin of 7.0, MCV 83, platelet count 289.  Review of Systems: Review of systems as noted in the HPI. All other systems reviewed and are negative.   Past Medical History:  Diagnosis Date   Depression    DM2 (diabetes mellitus, type 2) ([place])    Dyslipidemia    Hypertension    OA (osteoarthritis) of knee    Prostate cancer ([place])    prostate   Past Surgical History:  Procedure Laterality Date   INGUINAL HERNIA REPAIR     PROSTATE BIOPSY     TOTAL KNEE ARTHROPLASTY Left 10/26/2020   Procedure: TOTAL KNEE ARTHROPLASTY;  Surgeon: Paralee Cancel, MD;  Location: [facility];  Service: Orthopedics;  Laterality: Left;  70 mins   TOTAL KNEE ARTHROPLASTY Right 12/21/2020   Procedure: TOTAL KNEE ARTHROPLASTY;  Surgeon: Paralee Cancel, MD;  Location: [facility];  Service: Orthopedics;  Laterality: Right;  70 mins    Social History:  reports that he quit smoking about 42 years ago. His  smoking use included cigarettes. He has a 24.00 pack-year smoking history. He has never used smokeless tobacco. He reports current alcohol use. He reports that he does not use drugs.   No Known Allergies  Family History  Problem Relation Age of Onset   Pancreatic cancer Sister    Other Sister        brain tumor   CAD Mother    CAD Brother        CABGx4    CAD Brother        CABGx4   Colon cancer Neg Hx    Breast cancer Neg Hx       Prior to Admission medications   Medication Sig Start Date End Date Taking? Authorizing Provider  ALPRAZolam Duanne Moron) 1 MG tablet Take 1 mg by mouth at bedtime. 08/21/17  Yes [provider]  aspirin EC 81 MG tablet Take 81 mg by mouth daily. Swallow whole.   Yes [provider]  atorvastatin (LIPITOR) 10 MG tablet Take 10 mg by mouth every evening. 09/06/20  Yes [provider]  baclofen (LIORESAL) 10 MG tablet Take 10-20 mg by mouth 2 (two) times daily. Take 1 tablet (10 mg) in the morning and Take 2 tablets (20 mg) at bedtime 08/27/17  Yes [provider]  celecoxib (CELEBREX) 200 MG capsule Take 200 mg by mouth daily.   Yes [provider]  Cholecalciferol (VITAMIN D3) 50 MCG (2000 UT) TABS Take 2,000 Units by mouth every morning.   Yes  [provider]  Coenzyme Q10-Vitamin E (QUNOL ULTRA COQ10 PO) Take 1 capsule by mouth at bedtime.   Yes [provider]  DULoxetine (CYMBALTA) 60 MG capsule Take 60 mg by mouth every evening. 08/14/17  Yes [provider]  ferrous sulfate 325 (65 FE) MG tablet Take 325 mg by mouth 3 (three) times daily with meals.   Yes [provider]  glimepiride (AMARYL) 4 MG tablet Take 4 mg by mouth daily with breakfast. 08/14/17  Yes [provider]  levothyroxine (SYNTHROID) 100 MCG tablet Take 100 mcg by mouth daily before breakfast. 05/22/19  Yes [provider]  losartan (COZAAR) 25 MG tablet Take 12.5 mg by mouth every evening.  12/24/13  Yes [provider]  metFORMIN (GLUCOPHAGE) 1000 MG tablet Take 1,000 mg by mouth in the morning and at bedtime.   Yes [provider]  Multiple Vitamins-Minerals (MULTIVITAMIN ADULT) CHEW Chew 6 tablets by mouth every evening. Fruits & Veggies   Yes [provider]  pioglitazone (ACTOS) 15 MG tablet Take 15 mg by mouth daily. 08/14/17  Yes [provider]  REPATHA SURECLICK 485 MG/ML SOAJ INJECT 140MG SUBCUTANEOUSLY EVERY 2 WEEKS (14 DAYS) AS DIRECTED 06/09/21  Yes Hilty, Nadean Corwin, MD  tamsulosin (FLOMAX) 0.4 MG CAPS capsule Take 0.8 mg by mouth in the morning.   Yes [provider]  albuterol (VENTOLIN HFA) 108 (90 Base) MCG/ACT inhaler Inhale 1 puff into the lungs every 4 (four) hours as needed for wheezing or shortness of breath. 11/22/21   [provider]  Blood Glucose Monitoring Suppl (TRUE METRIX AIR GLUCOSE METER) DEVI True Metrix Air Glucose Meter kit    [provider]  docusate sodium (COLACE) 100 MG capsule Take 1 capsule (100 mg total) by mouth 2 (two) times daily. Patient not taking: Reported on 03/17/2022 12/21/20   Irving Copas, PA-C  glucose blood (TRUE METRIX BLOOD GLUCOSE TEST) test strip True Metrix Glucose Test Strip    [provider]  methocarbamol (ROBAXIN) 500 MG tablet Take 1 tablet (500 mg total) by mouth every 6 (six) hours as needed for muscle spasms. Patient not taking: Reported on 03/17/2022 12/21/20   Irving Copas, PA-C  TRUEplus Lancets 33G MISC TRUEplus Lancets 33 gauge    [provider]    Physical Exam: BP (!) 145/66   Pulse 64   Temp 97.9 F (36.6 C) (Oral)   Resp 11   Ht 5' 9"  (1.753 m)   Wt 108.9 kg   SpO2 100%   BMI 35.44 kg/m   General: 76 y.o. year-old male well developed well nourished in no acute distress.  Alert and oriented x3. Cardiovascular: Regular rate and rhythm with no rubs or gallops.  No thyromegaly or JVD noted.  No lower extremity edema. 2/4  pulses in all 4 extremities. Respiratory: Clear to auscultation with no wheezes or rales. Good inspiratory effort. Abdomen: Soft nontender nondistended with normal bowel sounds x4 quadrants. Muskuloskeletal: No cyanosis, clubbing or edema noted bilaterally Neuro: CN II-XII intact, strength, sensation, reflexes Skin: No ulcerative lesions noted or rashes Psychiatry: Judgement and insight appear normal. Mood is appropriate for condition and setting          Labs on Admission:  Basic Metabolic Panel: Recent Labs  Lab 03/17/22 1709  NA 140  K 4.2  CL 109  CO2 23  GLUCOSE 80  BUN 22  CREATININE 0.82  CALCIUM 9.4   Liver Function Tests: Recent Labs  Lab  03/17/22 1709  AST 18  ALT 11  ALKPHOS 72  BILITOT 1.0  PROT 7.1  ALBUMIN 3.9   No results for input(s): "LIPASE", "AMYLASE" in the last 168 hours. No results for input(s): "AMMONIA" in the last 168 hours. CBC: Recent Labs  Lab 03/17/22 1709  WBC 4.9  NEUTROABS 2.9  HGB 7.0*  HCT 23.7*  MCV 83.7  PLT 289   Cardiac Enzymes: No results for input(s): "CKTOTAL", "CKMB", "CKMBINDEX", "TROPONINI" in the last 168 hours.  BNP (last 3 results) No results for input(s): "BNP" in the last 8760 hours.  ProBNP (last 3 results) No results for input(s): "PROBNP" in the last 8760 hours.  CBG: No results for input(s): "GLUCAP" in the last 168 hours.  Radiological Exams on Admission: No results found.  EKG: I independently viewed the EKG done and my findings are as followed: Sinus rhythm rate of 62.  Nonspecific ST-T changes.  QTc 430.  Assessment/Plan Present on Admission:  Symptomatic anemia  Principal Problem:   Symptomatic anemia  Symptomatic anemia with hemoglobin of 6.7 Sent from his PCPs office for abnormal labs with hemoglobin of 6.7. Hemoglobin confirmed 7.0 in the ED. 2 units PRBCs ordered by EDP to be transfused. Repeat hemoglobin posttransfusion. Denies melena or hematochezia. On daily baby aspirin and  daily Celebrex, hold off IV Protonix 40 mg twice daily. [facility] consulted by EDP.  Type 2 diabetes with hyperglycemia Obtain hemoglobin A1c Hold off home oral hypoglycemics Start insulin sliding scale Clear liquid diet for now N.p.o. after midnight until seen by GI.  Hypothyroidism Resume home levothyroxine.  Essential hypertension BP is not at goal, elevated. Resume home oral antihypertensive. Continue to closely monitor vital signs  Hyperlipidemia Resume home regimen  BPH Resume home tamsulosin.  Chronic anxiety/depression Resume home regimen    DVT prophylaxis: SCDs due to concern for possible GI bleed.  Code Status: Full code  Family Communication: None at bedside.  Disposition Plan: Admitted to telemetry unit  Consults called: None.  Admission status: Inpatient status.   Status is: Inpatient The patient will require least 2 midnights for further evaluation and treatment of present condition.   Kayleen Memos MD [facility] Pager [phone]  If 7PM-7AM, please contact night-coverage [email or web address] Password [facility]  03/17/2022, 7:44 PM

## 2022-03-17 NOTE — ED Provider Notes (Signed)
[facility] DEPT Provider Note   CSN: [id number] Arrival date & time: 03/17/22  5400     History  Chief Complaint  Patient presents with   Abnormal Lab    Kevin Olson is a 76 y.o. male.  76 year old male presents due to low hemoglobin from his PCPs office.  He brought paperwork with him as her hemoglobin was 6.7.  8 days ago he was 7.3.  Patient also told me that his stools were guaiac positive in the office today.  He denies any black stools.  No blood per rectum.  No abdominal discomfort no hematemesis.  Has noted increasing weakness.  No prior history of GI bleed.  Does not take any blood thinners.  No treatment use prior to arrival       Home Medications Prior to Admission medications   Medication Sig Start Date End Date Taking? Authorizing Provider  ALPRAZolam Duanne Moron) 1 MG tablet Take 1 mg by mouth at bedtime. 08/21/17   [provider]  aspirin 81 MG chewable tablet aspirin 81 mg chewable tablet  CHEW 1 TABLET (81 MG TOTAL) BY MOUTH IN THE MORNING AND AT BEDTIME FOR 28 DAYS.    [provider]  atorvastatin (LIPITOR) 10 MG tablet Take 10 mg by mouth every evening. 09/06/20   [provider]  baclofen (LIORESAL) 10 MG tablet Take 10 mg by mouth 3 (three) times daily. 08/27/17   [provider]  Blood Glucose Monitoring Suppl (TRUE METRIX AIR GLUCOSE METER) DEVI True Metrix Air Glucose Meter kit    [provider]  celecoxib (CELEBREX) 200 MG capsule Take 200 mg by mouth 2 (two) times daily.    [provider]  Cholecalciferol (VITAMIN D3) 50 MCG (2000 UT) TABS Take 2,000 Units by mouth every evening.    [provider]  Coenzyme Q10-Vitamin E (QUNOL ULTRA COQ10 PO) Take 1 capsule by mouth at bedtime.    [provider]  docusate sodium (COLACE) 100 MG capsule Take 1 capsule (100 mg total) by mouth 2 (two) times daily. 12/21/20   Irving Copas, PA-C  DULoxetine (CYMBALTA)  60 MG capsule Take 60 mg by mouth every evening. 08/14/17   [provider]  ferrous sulfate 325 (65 FE) MG tablet ferrous sulfate 325 mg (65 mg iron) tablet  TAKE 1 TABLET 3 TIMES A DAY BY ORAL ROUTE FOR 2-3 WEEKS.    [provider]  glimepiride (AMARYL) 4 MG tablet Take 4 mg by mouth daily with breakfast. 08/14/17   [provider]  glucose blood (TRUE METRIX BLOOD GLUCOSE TEST) test strip True Metrix Glucose Test Strip    [provider]  levothyroxine (SYNTHROID) 100 MCG tablet Take 100 mcg by mouth daily before breakfast. 05/22/19   [provider]  losartan (COZAAR) 25 MG tablet Take 12.5 mg by mouth every evening. 12/24/13   [provider]  metFORMIN (GLUCOPHAGE) 1000 MG tablet Take 1,000 mg by mouth in the morning and at bedtime.    [provider]  methocarbamol (ROBAXIN) 500 MG tablet Take 1 tablet (500 mg total) by mouth every 6 (six) hours as needed for muscle spasms. 12/21/20   Irving Copas, PA-C  Multiple Vitamin (MULTIVITAMIN WITH MINERALS) TABS tablet Take 1 tablet by mouth daily. Centrum Silver    [provider]  pioglitazone (ACTOS) 15 MG tablet Take 15 mg by mouth daily. 08/14/17   [provider]  REPATHA SURECLICK 867 MG/ML SOAJ INJECT [place]  EVERY 2 WEEKS (14 DAYS) AS DIRECTED 06/09/21   Hilty, Nadean Corwin, MD  sitaGLIPtin (JANUVIA) 50 MG tablet Take 50 mg by mouth every evening.    [provider]  tamsulosin (FLOMAX) 0.4 MG CAPS capsule Take 0.8 mg by mouth daily.    [provider]  TRUEplus Lancets 33G MISC TRUEplus Lancets 70 gauge    [provider]  Turmeric Curcumin 500 MG CAPS Take 500 mg by mouth daily.    [provider]  Zinc 50 MG CAPS Take by mouth.    [provider]      Allergies    Patient has no known allergies.    Review of Systems   Review of Systems  All other systems reviewed and are negative.   Physical  Exam Updated Vital Signs BP 132/62 (BP Location: Left Arm)   Pulse 68   Temp 98.1 F (36.7 C) (Oral)   Resp 18   SpO2 98%  Physical Exam Vitals and nursing note reviewed.  Constitutional:      General: He is not in acute distress.    Appearance: Normal appearance. He is well-developed. He is not toxic-appearing.  HENT:     Head: Normocephalic and atraumatic.  Eyes:     General: Lids are normal.     Conjunctiva/sclera: Conjunctivae normal.     Pupils: Pupils are equal, round, and reactive to light.     Comments: Pale conjunctiva noted  Neck:     Thyroid: No thyroid mass.     Trachea: No tracheal deviation.  Cardiovascular:     Rate and Rhythm: Normal rate and regular rhythm.     Heart sounds: Normal heart sounds. No murmur heard.    No gallop.  Pulmonary:     Effort: Pulmonary effort is normal. No respiratory distress.     Breath sounds: Normal breath sounds. No stridor. No decreased breath sounds, wheezing, rhonchi or rales.  Abdominal:     General: There is no distension.     Palpations: Abdomen is soft.     Tenderness: There is no abdominal tenderness. There is no rebound.  Musculoskeletal:        General: No tenderness. Normal range of motion.     Cervical back: Normal range of motion and neck supple.  Skin:    General: Skin is warm and dry.     Coloration: Skin is pale.     Findings: No abrasion or rash.  Neurological:     Mental Status: He is alert and oriented to person, place, and time. Mental status is at baseline.     GCS: GCS eye subscore is 4. GCS verbal subscore is 5. GCS motor subscore is 6.     Cranial Nerves: No cranial nerve deficit.     Sensory: No sensory deficit.     Motor: Motor function is intact.  Psychiatric:        Attention and Perception: Attention normal.        Speech: Speech normal.        Behavior: Behavior normal.     ED Results / Procedures / Treatments   Labs (all labs ordered are listed, but only abnormal results are  displayed) Labs Reviewed  CBC WITH DIFFERENTIAL/PLATELET  COMPREHENSIVE METABOLIC PANEL  TYPE AND SCREEN    EKG None  Radiology No results found.  Procedures Procedures    Medications Ordered in ED Medications  lactated ringers infusion (has no administration in time range)    ED Course/  Medical Decision Making/ A&P                           Medical Decision Making Amount and/or Complexity of Data Reviewed Labs: ordered.  Risk Prescription drug management.  Patient hemodynamically stable at this time. Patient with known guaiac positive stools.  No hemoglobin.  He has symptomatic anemia.  2 units of packed red blood cells have been ordered.  His repeat hemoglobin here is 7.  Do not feel that patient needs intra-abdominal imaging at this time..  We will send secure chat message to [facility] on-call.  Plan will be to admit to the hospitalist service.        Final Clinical Impression(s) / ED Diagnoses Final diagnoses:  None    Rx / DC Orders ED Discharge Orders     None         Lacretia Leigh, MD 03/17/22 Bosie Helper

## 2022-03-17 NOTE — ED Notes (Signed)
ED TO INPATIENT HANDOFF REPORT  ED Nurse Name and Phone #: Elpidio Eric [phone]  S Name/Age/Gender Kevin Olson 76 y.o. male Room/Bed: WA13/WA13  Code Status   Code Status: Not on file  Home/SNF/Other Home Patient oriented to: self, place, time, and situation Is this baseline? Yes   Triage Complete: Triage complete  Chief Complaint Symptomatic anemia [D64.9]  Triage Note Pt arrived via POV, abnormal lab hemoglobin 6.6.States he has felt fatigued.    Allergies No Known Allergies  Level of Care/Admitting Diagnosis ED Disposition     ED Disposition  Admit   Condition  --   Comment  Hospital Area: [facility] [[id number]]  Level of Care: Telemetry [5]  Admit to tele based on following criteria: Monitor for Ischemic changes  May admit patient to [facility] or [facility] if equivalent level of care is available:: Yes  Covid Evaluation: Asymptomatic - no recent exposure (last 10 days) testing not required  Diagnosis: Symptomatic anemia [6270350]  Admitting Physician: Kayleen Memos [[id number]]  Attending Physician: Kayleen Memos [[id number]]  Estimated length of stay: past midnight tomorrow  Certification:: I certify this patient will need inpatient services for at least 2 midnights          B Medical/Surgery History Past Medical History:  Diagnosis Date   Depression    DM2 (diabetes mellitus, type 2) ([place])    Dyslipidemia    Hypertension    OA (osteoarthritis) of knee    Prostate cancer ([place])    prostate   Past Surgical History:  Procedure Laterality Date   INGUINAL HERNIA REPAIR     PROSTATE BIOPSY     TOTAL KNEE ARTHROPLASTY Left 10/26/2020   Procedure: TOTAL KNEE ARTHROPLASTY;  Surgeon: Paralee Cancel, MD;  Location: [facility];  Service: Orthopedics;  Laterality: Left;  70 mins   TOTAL KNEE ARTHROPLASTY Right 12/21/2020   Procedure: TOTAL KNEE ARTHROPLASTY;  Surgeon: Paralee Cancel, MD;  Location: [facility];  Service: Orthopedics;  Laterality:  Right;  70 mins     A IV Location/Drains/Wounds Patient Lines/Drains/Airways Status     Active Line/Drains/Airways     Name Placement date Placement time Site Days   Peripheral IV 01/12/20 22 G Right Hand 01/12/20  1102  Hand  795   Peripheral IV 03/17/22 20 G 1" Distal;Left;Posterior Forearm 03/17/22  1712  Forearm  less than 1   Incision (Closed) 10/26/20 Knee Left 10/26/20  1027  -- 507   Incision (Closed) 12/21/20 Knee Right 12/21/20  1025  -- 451            Intake/Output Last 24 hours  Intake/Output Summary (Last 24 hours) at 03/17/2022 2025 Last data filed at 03/17/2022 1855 Gross per 24 hour  Intake 315 ml  Output --  Net 315 ml    Labs/Imaging Results for orders placed or performed during the hospital encounter of 03/17/22 (from the past 48 hour(s))  CBC with Differential/Platelet     Status: Abnormal   Collection Time: 03/17/22  5:09 PM  Result Value Ref Range   WBC 4.9 4.0 - 10.5 K/uL   RBC 2.83 (L) 4.22 - 5.81 MIL/uL   Hemoglobin 7.0 (L) 13.0 - 17.0 g/dL   HCT 23.7 (L) 39.0 - 52.0 %   MCV 83.7 80.0 - 100.0 fL   MCH 24.7 (L) 26.0 - 34.0 pg   MCHC 29.5 (L) 30.0 - 36.0 g/dL   RDW 15.2 11.5 - 15.5 %   Platelets 289 150 - 400  K/uL   nRBC 0.0 0.0 - 0.2 %   Neutrophils Relative % 60 %   Neutro Abs 2.9 1.7 - 7.7 K/uL   Lymphocytes Relative 25 %   Lymphs Abs 1.3 0.7 - 4.0 K/uL   Monocytes Relative 11 %   Monocytes Absolute 0.5 0.1 - 1.0 K/uL   Eosinophils Relative 3 %   Eosinophils Absolute 0.2 0.0 - 0.5 K/uL   Basophils Relative 1 %   Basophils Absolute 0.1 0.0 - 0.1 K/uL   Immature Granulocytes 0 %   Abs Immature Granulocytes 0.01 0.00 - 0.07 K/uL    Comment: Performed at [facility], [place] [address], [place], [place] [address]  Comprehensive metabolic panel     Status: None   Collection Time: 03/17/22  5:09 PM  Result Value Ref Range   Sodium 140 135 - 145 mmol/L   Potassium 4.2 3.5 - 5.1 mmol/L   Chloride 109 98 - 111 mmol/L    CO2 23 22 - 32 mmol/L   Glucose, Bld 80 70 - 99 mg/dL    Comment: Glucose reference range applies only to samples taken after fasting for at least 8 hours.   BUN 22 8 - 23 mg/dL   Creatinine, Ser 0.82 0.61 - 1.24 mg/dL   Calcium 9.4 8.9 - 10.3 mg/dL   Total Protein 7.1 6.5 - 8.1 g/dL   Albumin 3.9 3.5 - 5.0 g/dL   AST 18 15 - 41 U/L   ALT 11 0 - 44 U/L   Alkaline Phosphatase 72 38 - 126 U/L   Total Bilirubin 1.0 0.3 - 1.2 mg/dL   GFR, Estimated >60 >60 mL/min    Comment: (NOTE) Calculated using the CKD-EPI Creatinine Equation (2021)    Anion gap 8 5 - 15    Comment: Performed at [facility], [place] [address], [place], [place] [address]  Type and screen     Status: None (Preliminary result)   Collection Time: 03/17/22  5:09 PM  Result Value Ref Range   ABO/RH(D) O POS    Antibody Screen NEG    Sample Expiration 03/20/2022,2359    Unit Number [id number]    Blood Component Type RED CELLS,LR    Unit division 00    Status of Unit ISSUED    Transfusion Status OK TO TRANSFUSE    Crossmatch Result      Compatible Performed at [place] [address] [place], [place] [address]    Unit Number [id number]    Blood Component Type RED CELLS,LR    Unit division 00    Status of Unit ALLOCATED    Transfusion Status OK TO TRANSFUSE    Crossmatch Result Compatible   Prepare RBC (crossmatch)     Status: None   Collection Time: 03/17/22  5:09 PM  Result Value Ref Range   Order Confirmation      ORDER PROCESSED BY BLOOD BANK Performed at [facility], [place] [address], [place], [place] [address]    No results found.  Pending Labs Unresulted Labs (From admission, onward)    None       Vitals/Pain Today's Vitals   03/17/22 1838 03/17/22 1855 03/17/22 1930 03/17/22 2000  BP: 134/65 (!) 145/66 (!) 162/76 (!) 152/75  Pulse: 66 64 72 62  Resp: '14 11 16 14  '$ Temp: 97.6 F (36.4 C) 97.9 F (36.6 C)    TempSrc: Oral  Oral    SpO2: 100% 100% 96% 97%  Weight:      Height:      PainSc:        Isolation Precautions No active isolations  Medications Medications  lactated ringers infusion ( Intravenous New Bag/Given 03/17/22 1728)  0.9 %  sodium chloride infusion (10 mL/hr Intravenous New Bag/Given 03/17/22 1800)    Mobility walks Low fall risk   Focused Assessments    R Recommendations: See Admitting Provider Note  Report given to:   Additional Notes:

## 2022-03-17 NOTE — ED Triage Notes (Signed)
Pt arrived via POV, abnormal lab hemoglobin 6.6.States he has felt fatigued.

## 2022-03-18 ENCOUNTER — Encounter (HOSPITAL_COMMUNITY): Payer: Self-pay | Admitting: Internal Medicine

## 2022-03-18 DIAGNOSIS — C61 Malignant neoplasm of prostate: Secondary | ICD-10-CM

## 2022-03-18 DIAGNOSIS — E119 Type 2 diabetes mellitus without complications: Secondary | ICD-10-CM | POA: Diagnosis not present

## 2022-03-18 DIAGNOSIS — M25562 Pain in left knee: Secondary | ICD-10-CM

## 2022-03-18 DIAGNOSIS — G629 Polyneuropathy, unspecified: Secondary | ICD-10-CM | POA: Insufficient documentation

## 2022-03-18 DIAGNOSIS — F419 Anxiety disorder, unspecified: Secondary | ICD-10-CM | POA: Insufficient documentation

## 2022-03-18 DIAGNOSIS — M25561 Pain in right knee: Secondary | ICD-10-CM

## 2022-03-18 DIAGNOSIS — E1142 Type 2 diabetes mellitus with diabetic polyneuropathy: Secondary | ICD-10-CM | POA: Insufficient documentation

## 2022-03-18 DIAGNOSIS — G250 Essential tremor: Secondary | ICD-10-CM | POA: Insufficient documentation

## 2022-03-18 DIAGNOSIS — D500 Iron deficiency anemia secondary to blood loss (chronic): Secondary | ICD-10-CM

## 2022-03-18 DIAGNOSIS — E039 Hypothyroidism, unspecified: Secondary | ICD-10-CM

## 2022-03-18 DIAGNOSIS — Z96651 Presence of right artificial knee joint: Secondary | ICD-10-CM

## 2022-03-18 DIAGNOSIS — E782 Mixed hyperlipidemia: Secondary | ICD-10-CM | POA: Insufficient documentation

## 2022-03-18 DIAGNOSIS — Z96652 Presence of left artificial knee joint: Secondary | ICD-10-CM

## 2022-03-18 DIAGNOSIS — G8929 Other chronic pain: Secondary | ICD-10-CM

## 2022-03-18 DIAGNOSIS — Z860100 Personal history of colon polyps, unspecified: Secondary | ICD-10-CM

## 2022-03-18 DIAGNOSIS — D649 Anemia, unspecified: Secondary | ICD-10-CM | POA: Diagnosis not present

## 2022-03-18 DIAGNOSIS — M48061 Spinal stenosis, lumbar region without neurogenic claudication: Secondary | ICD-10-CM

## 2022-03-18 DIAGNOSIS — K5730 Diverticulosis of large intestine without perforation or abscess without bleeding: Secondary | ICD-10-CM | POA: Insufficient documentation

## 2022-03-18 DIAGNOSIS — M1990 Unspecified osteoarthritis, unspecified site: Secondary | ICD-10-CM

## 2022-03-18 DIAGNOSIS — D509 Iron deficiency anemia, unspecified: Secondary | ICD-10-CM

## 2022-03-18 LAB — CBC WITH DIFFERENTIAL/PLATELET
Abs Immature Granulocytes: 0.01 10*3/uL (ref 0.00–0.07)
Basophils Absolute: 0.1 10*3/uL (ref 0.0–0.1)
Basophils Relative: 1 %
Eosinophils Absolute: 0.2 10*3/uL (ref 0.0–0.5)
Eosinophils Relative: 5 %
HCT: 28.6 % — ABNORMAL LOW (ref 39.0–52.0)
Hemoglobin: 8.5 g/dL — ABNORMAL LOW (ref 13.0–17.0)
Immature Granulocytes: 0 %
Lymphocytes Relative: 21 %
Lymphs Abs: 0.9 10*3/uL (ref 0.7–4.0)
MCH: 25.3 pg — ABNORMAL LOW (ref 26.0–34.0)
MCHC: 29.7 g/dL — ABNORMAL LOW (ref 30.0–36.0)
MCV: 85.1 fL (ref 80.0–100.0)
Monocytes Absolute: 0.6 10*3/uL (ref 0.1–1.0)
Monocytes Relative: 12 %
Neutro Abs: 2.7 10*3/uL (ref 1.7–7.7)
Neutrophils Relative %: 61 %
Platelets: 283 10*3/uL (ref 150–400)
RBC: 3.36 MIL/uL — ABNORMAL LOW (ref 4.22–5.81)
RDW: 15.6 % — ABNORMAL HIGH (ref 11.5–15.5)
WBC: 4.4 10*3/uL (ref 4.0–10.5)
nRBC: 0 % (ref 0.0–0.2)

## 2022-03-18 LAB — TYPE AND SCREEN
ABO/RH(D): O POS
Antibody Screen: NEGATIVE
Unit division: 0
Unit division: 0

## 2022-03-18 LAB — COMPREHENSIVE METABOLIC PANEL
ALT: 7 U/L (ref 0–44)
AST: 33 U/L (ref 15–41)
Albumin: 3.4 g/dL — ABNORMAL LOW (ref 3.5–5.0)
Alkaline Phosphatase: 62 U/L (ref 38–126)
Anion gap: 10 (ref 5–15)
BUN: 17 mg/dL (ref 8–23)
CO2: 22 mmol/L (ref 22–32)
Calcium: 8.8 mg/dL — ABNORMAL LOW (ref 8.9–10.3)
Chloride: 108 mmol/L (ref 98–111)
Creatinine, Ser: 0.88 mg/dL (ref 0.61–1.24)
GFR, Estimated: >60 mL/min (ref >60)
Glucose, Bld: 129 mg/dL — ABNORMAL HIGH (ref 70–99)
Potassium: 4.6 mmol/L (ref 3.5–5.1)
Sodium: 140 mmol/L (ref 135–145)
Total Bilirubin: 1.7 mg/dL — ABNORMAL HIGH (ref 0.3–1.2)
Total Protein: 6.4 g/dL — ABNORMAL LOW (ref 6.5–8.1)

## 2022-03-18 LAB — GLUCOSE, CAPILLARY
Glucose-Capillary: 140 mg/dL — ABNORMAL HIGH (ref 70–99)
Glucose-Capillary: 132 mg/dL — ABNORMAL HIGH (ref 70–99)
Glucose-Capillary: 206 mg/dL — ABNORMAL HIGH (ref 70–99)
Glucose-Capillary: 97 mg/dL (ref 70–99)

## 2022-03-18 LAB — CBC
HCT: 27.8 % — ABNORMAL LOW (ref 39.0–52.0)
Hemoglobin: 8.5 g/dL — ABNORMAL LOW (ref 13.0–17.0)
MCH: 25.4 pg — ABNORMAL LOW (ref 26.0–34.0)
MCHC: 30.6 g/dL (ref 30.0–36.0)
MCV: 83.2 fL (ref 80.0–100.0)
Platelets: 260 10*3/uL (ref 150–400)
RBC: 3.34 MIL/uL — ABNORMAL LOW (ref 4.22–5.81)
RDW: 15.7 % — ABNORMAL HIGH (ref 11.5–15.5)
WBC: 5.2 10*3/uL (ref 4.0–10.5)
nRBC: 0 % (ref 0.0–0.2)

## 2022-03-18 LAB — BPAM RBC
Blood Product Expiration Date: 202307242359
Blood Product Expiration Date: 202307242359
ISSUE DATE / TIME: 202306301831
ISSUE DATE / TIME: 202306302332
Unit Type and Rh: 5100
Unit Type and Rh: 5100

## 2022-03-18 LAB — PHOSPHORUS: Phosphorus: 4.2 mg/dL (ref 2.5–4.6)

## 2022-03-18 LAB — LACTATE DEHYDROGENASE: LDH: 126 U/L (ref 98–192)

## 2022-03-18 LAB — MAGNESIUM: Magnesium: 2.3 mg/dL (ref 1.7–2.4)

## 2022-03-18 MED ORDER — SODIUM CHLORIDE 0.9 % IV SOLN
250.0000 mg | Freq: Every day | INTRAVENOUS | Status: AC
  Administered 2022-03-18 – 2022-03-19 (×2): 250 mg via INTRAVENOUS
  Filled 2022-03-18 (×2): qty 20

## 2022-03-18 NOTE — Progress Notes (Signed)
PROGRESS NOTE  Kevin Olson DVV:[id number] DOB: 1946-06-02   PCP: Aurea Graff, PA-C (Inactive)  Patient is from: Home  DOA: 03/17/2022 LOS: 1  Chief complaints Chief Complaint  Patient presents with   Abnormal Lab     Brief Narrative / Interim history: 76 year old M with PMH of CAD on low-dose ASA, DM-2, HTN, hypothyroidism, HLD, anxiety, depression, BPH/prostate cancer, chronic pain/OA, anxiety, obesity and spinal stenosis on Celebrex directed to ED by PCP due to low hemoglobin to 6.6 from ~13 about 3 months prior.  Anemia panel at PCP office consistent with severe iron deficiency.  He has DOE and lightheadedness on presentation.  Denied melena or hematochezia.  Admits to occasional ibuprofen use.  In ED, stable vitals.  Hgb 7.0.  Transfused 2 units.  [facility] consulted.  Subjective: Seen and examined earlier this morning.  No major events overnight of this morning.  Feels better today.  Denies chest pain or dyspnea at rest.  He has not gotten up yet.  Denies nausea, vomiting, abdominal pain, melena or hematochezia.  Denies UTI symptoms.  Objective: Vitals:   03/18/22 0001 03/18/22 0258 03/18/22 0531 03/18/22 0909  BP: (!) 129/59 133/69 (!) 137/55 (!) 140/59  Pulse: 64 61 (!) 54 100  Resp: '16 16 16 18  '$ Temp: 97.8 F (36.6 C) 97.6 F (36.4 C) 97.6 F (36.4 C) 97.7 F (36.5 C)  TempSrc: Oral Oral Oral Oral  SpO2: 96% 97% 96% 99%  Weight:      Height:        Examination:  GENERAL: No apparent distress.  Nontoxic. HEENT: MMM.  Vision and hearing grossly intact.  NECK: Supple.  No apparent JVD.  RESP:  No IWOB.  Fair aeration bilaterally. CVS:  RRR. Heart sounds normal.  ABD/GI/GU: BS+. Abd soft, NTND.  MSK/EXT:  Moves extremities. No apparent deformity. No edema.  SKIN: Some skin abrasion over BLE no signs of infection. NEURO: Awake, alert and oriented appropriately.  No apparent focal neuro deficit. PSYCH: Calm. Normal affect.   Procedures:   None  Microbiology summarized: None  Assessment and plan: Principal Problem:   Symptomatic anemia Active Problems:   Bilateral knee pain   Spinal stenosis of lumbar region   Malignant neoplasm of prostate (HCC)   Status post total left knee replacement   S/P total knee arthroplasty, right   Iron deficiency anemia   Hypothyroidism   Personal history of colonic polyps   Osteoarthritis   Diabetes mellitus type 2, noninsulin dependent (HCC)   Morbid obesity (HCC)  Symptomatic iron deficiency anemia: Hgb 6.6 at PCP office from about 13 about 3 months ago prior.  Denies melena or hematochezia.  On low-dose aspirin for CAD.  On Celebrex for osteoarthritis.  Also occasional ibuprofen.  History of diverticulosis and colonic polyps.  Anemia panel at PCP office with severe iron deficiency.  Had DOE and lightheadedness on presentation.  Hemodynamically stable.  Transfused 2 units with appropriate response.  H&H seems to be stable now. Recent Labs    03/17/22 1709 03/18/22 0820 03/18/22 1133  HGB 7.0* 8.5* 8.5*  -Continue IV PPI -IV ferric gluconate 250 mg daily x2 -[facility] consulted and will see patient. -Monitor H&H.  Goal Hgb> 8.0. -Advised against NSAID use.   Controlled NIDDM-2 with HLD and polyneuropathy: A1c 6.3%.  On Actos, glimepiride and metformin at home. Recent Labs  Lab 03/17/22 2155 03/18/22 0759 03/18/22 1153  GLUCAP 90 140* 132*  -Continue moderate sliding scale -Continue home Lipitor and Cymbalta. -  On Repatha for hyperlipidemia.  Hypothyroidism -Continue home Synthroid   Essential hypertension: Normotensive. -Continue home losartan.   BPH/history of prostate cancer -Continue home tamsulosin.   Chronic anxiety/depression -Continue home regimen.  History of bilateral knee osteoarthritis/bilateral TKA -As needed Tylenol and tramadol  Morbid obesity with comorbidities as above Body mass index is 35.45 kg/m. -Encourage lifestyle change to lose  weight.         DVT prophylaxis:  SCDs Start: 03/18/22 1224  Code Status: Full code Family Communication: None at bedside Level of care: Telemetry Status is: Inpatient Remains inpatient appropriate because: Due to symptomatic iron deficiency anemia   Final disposition: Likely home once medically cleared Consultants:  [facility]  Sch Meds:  Scheduled Meds:  ALPRAZolam  1 mg Oral QHS   atorvastatin  10 mg Oral QPM   DULoxetine  60 mg Oral QPM   ferrous sulfate  325 mg Oral TID WC   insulin aspart  0-15 Units Subcutaneous TID WC   insulin aspart  0-5 Units Subcutaneous QHS   levothyroxine  100 mcg Oral QAC breakfast   pantoprazole (PROTONIX) IV  40 mg Intravenous BID   tamsulosin  0.8 mg Oral q AM   Continuous Infusions:  ferric gluconate (FERRLECIT) IVPB     lactated ringers 50 mL/hr at 03/18/22 0700   PRN Meds:.acetaminophen, melatonin, ondansetron (ZOFRAN) IV, polyethylene glycol  Antimicrobials: Anti-infectives (From admission, onward)    None        I have personally reviewed the following labs and images: CBC: Recent Labs  Lab 03/17/22 1709 03/18/22 0820 03/18/22 1133  WBC 4.9 4.4 5.2  NEUTROABS 2.9 2.7  --   HGB 7.0* 8.5* 8.5*  HCT 23.7* 28.6* 27.8*  MCV 83.7 85.1 83.2  PLT 289 283 260   BMP &GFR Recent Labs  Lab 03/17/22 1709 03/18/22 0820  NA 140 140  K 4.2 4.6  CL 109 108  CO2 23 22  GLUCOSE 80 129*  BUN 22 17  CREATININE 0.82 0.88  CALCIUM 9.4 8.8*  MG  --  2.3  PHOS  --  4.2   Estimated Creatinine Clearance: 89.6 mL/min (by C-G formula based on SCr of 0.88 mg/dL). Liver & Pancreas: Recent Labs  Lab 03/17/22 1709 03/18/22 0820  AST 18 33  ALT 11 7  ALKPHOS 72 62  BILITOT 1.0 1.7*  PROT 7.1 6.4*  ALBUMIN 3.9 3.4*   No results for input(s): "LIPASE", "AMYLASE" in the last 168 hours. No results for input(s): "AMMONIA" in the last 168 hours. Diabetic: Recent Labs    03/17/22 1709  HGBA1C 6.3*   Recent  Labs  Lab 03/17/22 2155 03/18/22 0759 03/18/22 1153  GLUCAP 90 140* 132*   Cardiac Enzymes: No results for input(s): "CKTOTAL", "CKMB", "CKMBINDEX", "TROPONINI" in the last 168 hours. No results for input(s): "PROBNP" in the last 8760 hours. Coagulation Profile: No results for input(s): "INR", "PROTIME" in the last 168 hours. Thyroid Function Tests: No results for input(s): "TSH", "T4TOTAL", "FREET4", "T3FREE", "THYROIDAB" in the last 72 hours. Lipid Profile: No results for input(s): "CHOL", "HDL", "LDLCALC", "TRIG", "CHOLHDL", "LDLDIRECT" in the last 72 hours. Anemia Panel: No results for input(s): "VITAMINB12", "FOLATE", "FERRITIN", "TIBC", "IRON", "RETICCTPCT" in the last 72 hours. Urine analysis: No results found for: "COLORURINE", "APPEARANCEUR", "LABSPEC", "PHURINE", "GLUCOSEU", "HGBUR", "BILIRUBINUR", "KETONESUR", "PROTEINUR", "UROBILINOGEN", "NITRITE", "LEUKOCYTESUR" Sepsis Labs: Invalid input(s): "PROCALCITONIN", "LACTICIDVEN"  Microbiology: No results found for this or any previous visit (from the past 240 hour(s)).  Radiology Studies: No results  found.    Kenon Delashmit T. Caroline  If 7PM-7AM, please contact night-coverage [email or web address] 03/18/2022, 12:37 PM

## 2022-03-18 NOTE — Consult Note (Signed)
Reason for Consult: Symptomatic anemia Referring Physician: Hospital team  Kevin Olson is an 76 y.o. male.  HPI: Patient seen and examined in his hospital computer chart and our office computer chart thoroughly reviewed and he has had no GI symptoms but is on aspirin and Celebrex at home but no blood thinners and has not seen any blood in his bowels and just this week turned in his guaiac cards and in March his hemoglobin was normal but was found to be anemic last week but did not start his iron until yesterday because of doing the cards and he did have a colonoscopy by Dr. Michail Sermon last year and his family history is negative for any GI problems and he has not been losing weight and has no other complaints other than dyspnea on exertion which has been going on about 3 to 4 weeks  Past Medical History:  Diagnosis Date   Depression    DM2 (diabetes mellitus, type 2) ([address])    Dyslipidemia    Hypertension    OA (osteoarthritis) of knee    Prostate cancer [facility])    prostate    Past Surgical History:  Procedure Laterality Date   INGUINAL HERNIA REPAIR     PROSTATE BIOPSY     TOTAL KNEE ARTHROPLASTY Left 10/26/2020   Procedure: TOTAL KNEE ARTHROPLASTY;  Surgeon: Paralee Cancel, MD;  Location: [facility];  Service: Orthopedics;  Laterality: Left;  70 mins   TOTAL KNEE ARTHROPLASTY Right 12/21/2020   Procedure: TOTAL KNEE ARTHROPLASTY;  Surgeon: Paralee Cancel, MD;  Location: [facility];  Service: Orthopedics;  Laterality: Right;  34 mins    Family History  Problem Relation Age of Onset   Pancreatic cancer Sister    Other Sister        brain tumor   CAD Mother    CAD Brother        CABGx4    CAD Brother        CABGx4   Colon cancer Neg Hx    Breast cancer Neg Hx     Social History:  reports that he quit smoking about 42 years ago. His smoking use included cigarettes. He has a 24.00 pack-year smoking history. He has never used smokeless tobacco. He reports current alcohol use. He reports that he  does not use drugs.  Allergies: No Known Allergies  Medications: I have reviewed the patient's current medications.  Results for orders placed or performed during the hospital encounter of 03/17/22 (from the past 48 hour(s))  CBC with Differential/Platelet     Status: Abnormal   Collection Time: 03/17/22  5:09 PM  Result Value Ref Range   WBC 4.9 4.0 - 10.5 K/uL   RBC 2.83 (L) 4.22 - 5.81 MIL/uL   Hemoglobin 7.0 (L) 13.0 - 17.0 g/dL   HCT 23.7 (L) 39.0 - 52.0 %   MCV 83.7 80.0 - 100.0 fL   MCH 24.7 (L) 26.0 - 34.0 pg   MCHC 29.5 (L) 30.0 - 36.0 g/dL   RDW 15.2 11.5 - 15.5 %   Platelets 289 150 - 400 K/uL   nRBC 0.0 0.0 - 0.2 %   Neutrophils Relative % 60 %   Neutro Abs 2.9 1.7 - 7.7 K/uL   Lymphocytes Relative 25 %   Lymphs Abs 1.3 0.7 - 4.0 K/uL   Monocytes Relative 11 %   Monocytes Absolute 0.5 0.1 - 1.0 K/uL   Eosinophils Relative 3 %   Eosinophils Absolute 0.2 0.0 - 0.5 K/uL  Basophils Relative 1 %   Basophils Absolute 0.1 0.0 - 0.1 K/uL   Immature Granulocytes 0 %   Abs Immature Granulocytes 0.01 0.00 - 0.07 K/uL    Comment: Performed at [facility], [place] [address] [place], [place] [address]  Comprehensive metabolic panel     Status: None   Collection Time: 03/17/22  5:09 PM  Result Value Ref Range   Sodium 140 135 - 145 mmol/L   Potassium 4.2 3.5 - 5.1 mmol/L   Chloride 109 98 - 111 mmol/L   CO2 23 22 - 32 mmol/L   Glucose, Bld 80 70 - 99 mg/dL    Comment: Glucose reference range applies only to samples taken after fasting for at least 8 hours.   BUN 22 8 - 23 mg/dL   Creatinine, Ser 0.82 0.61 - 1.24 mg/dL   Calcium 9.4 8.9 - 10.3 mg/dL   Total Protein 7.1 6.5 - 8.1 g/dL   Albumin 3.9 3.5 - 5.0 g/dL   AST 18 15 - 41 U/L   ALT 11 0 - 44 U/L   Alkaline Phosphatase 72 38 - 126 U/L   Total Bilirubin 1.0 0.3 - 1.2 mg/dL   GFR, Estimated >60 >60 mL/min    Comment: (NOTE) Calculated using the CKD-EPI Creatinine Equation (2021)    Anion gap 8  5 - 15    Comment: Performed at [facility], [place] [address], [place], [place] [address]  Type and screen     Status: None (Preliminary result)   Collection Time: 03/17/22  5:09 PM  Result Value Ref Range   ABO/RH(D) O POS    Antibody Screen NEG    Sample Expiration 03/20/2022,2359    Unit Number [id number]    Blood Component Type RED CELLS,LR    Unit division 00    Status of Unit ISSUED    Transfusion Status OK TO TRANSFUSE    Crossmatch Result Compatible    Unit Number [id number]    Blood Component Type RED CELLS,LR    Unit division 00    Status of Unit ISSUED    Transfusion Status OK TO TRANSFUSE    Crossmatch Result      Compatible Performed at [facility], [place] [address], [place], [place] [address]   Prepare RBC (crossmatch)     Status: None   Collection Time: 03/17/22  5:09 PM  Result Value Ref Range   Order Confirmation      ORDER PROCESSED BY BLOOD BANK Performed at [facility], [place] [address], [place], [place] [address]   Hemoglobin A1c     Status: Abnormal   Collection Time: 03/17/22  5:09 PM  Result Value Ref Range   Hgb A1c MFr Bld 6.3 (H) 4.8 - 5.6 %    Comment: (NOTE) Pre diabetes:          5.7%-6.4%  Diabetes:              >6.4%  Glycemic control for   <7.0% adults with diabetes    Mean Plasma Glucose 134.11 mg/dL    Comment: Performed at [place] [address], [place], [place] [address]  Glucose, capillary     Status: None   Collection Time: 03/17/22  9:55 PM  Result Value Ref Range   Glucose-Capillary 90 70 - 99 mg/dL    Comment: Glucose reference range applies only to samples taken after fasting for at least 8 hours.  Glucose, capillary  Status: Abnormal   Collection Time: 03/18/22  7:59 AM  Result Value Ref Range   Glucose-Capillary 140 (H) 70 - 99 mg/dL    Comment: Glucose reference range applies only to samples taken after fasting for at least 8 hours.  CBC with  Differential/Platelet     Status: Abnormal   Collection Time: 03/18/22  8:20 AM  Result Value Ref Range   WBC 4.4 4.0 - 10.5 K/uL   RBC 3.36 (L) 4.22 - 5.81 MIL/uL   Hemoglobin 8.5 (L) 13.0 - 17.0 g/dL   HCT 28.6 (L) 39.0 - 52.0 %   MCV 85.1 80.0 - 100.0 fL   MCH 25.3 (L) 26.0 - 34.0 pg   MCHC 29.7 (L) 30.0 - 36.0 g/dL   RDW 15.6 (H) 11.5 - 15.5 %   Platelets 283 150 - 400 K/uL   nRBC 0.0 0.0 - 0.2 %   Neutrophils Relative % 61 %   Neutro Abs 2.7 1.7 - 7.7 K/uL   Lymphocytes Relative 21 %   Lymphs Abs 0.9 0.7 - 4.0 K/uL   Monocytes Relative 12 %   Monocytes Absolute 0.6 0.1 - 1.0 K/uL   Eosinophils Relative 5 %   Eosinophils Absolute 0.2 0.0 - 0.5 K/uL   Basophils Relative 1 %   Basophils Absolute 0.1 0.0 - 0.1 K/uL   Immature Granulocytes 0 %   Abs Immature Granulocytes 0.01 0.00 - 0.07 K/uL    Comment: Performed at [facility], [place] [address] [place], [place] [address]  Comprehensive metabolic panel     Status: Abnormal   Collection Time: 03/18/22  8:20 AM  Result Value Ref Range   Sodium 140 135 - 145 mmol/L   Potassium 4.6 3.5 - 5.1 mmol/L   Chloride 108 98 - 111 mmol/L   CO2 22 22 - 32 mmol/L   Glucose, Bld 129 (H) 70 - 99 mg/dL    Comment: Glucose reference range applies only to samples taken after fasting for at least 8 hours.   BUN 17 8 - 23 mg/dL   Creatinine, Ser 0.88 0.61 - 1.24 mg/dL   Calcium 8.8 (L) 8.9 - 10.3 mg/dL   Total Protein 6.4 (L) 6.5 - 8.1 g/dL   Albumin 3.4 (L) 3.5 - 5.0 g/dL   AST 33 15 - 41 U/L   ALT 7 0 - 44 U/L   Alkaline Phosphatase 62 38 - 126 U/L   Total Bilirubin 1.7 (H) 0.3 - 1.2 mg/dL   GFR, Estimated >60 >60 mL/min    Comment: (NOTE) Calculated using the CKD-EPI Creatinine Equation (2021)    Anion gap 10 5 - 15    Comment: Performed at [facility], [place] [address], [place],  [address]  Magnesium     Status: None   Collection Time: 03/18/22  8:20 AM  Result Value Ref Range    Magnesium 2.3 1.7 - 2.4 mg/dL    Comment: Performed at [facility], [place] [address] [place],  [address]  Phosphorus     Status: None   Collection Time: 03/18/22  8:20 AM  Result Value Ref Range   Phosphorus 4.2 2.5 - 4.6 mg/dL    Comment: Performed at [facility], [place] [address] [place], [place] [address]  Lactate dehydrogenase     Status: None   Collection Time: 03/18/22 10:41 AM  Result Value Ref Range   LDH 126 98 - 192 U/L    Comment: Performed at [facility]  [place] [address], [place], [place] [address]  CBC     Status: Abnormal   Collection Time: 03/18/22 11:33 AM  Result Value Ref Range   WBC 5.2 4.0 - 10.5 K/uL   RBC 3.34 (L) 4.22 - 5.81 MIL/uL   Hemoglobin 8.5 (L) 13.0 - 17.0 g/dL   HCT 27.8 (L) 39.0 - 52.0 %   MCV 83.2 80.0 - 100.0 fL   MCH 25.4 (L) 26.0 - 34.0 pg   MCHC 30.6 30.0 - 36.0 g/dL   RDW 15.7 (H) 11.5 - 15.5 %   Platelets 260 150 - 400 K/uL   nRBC 0.0 0.0 - 0.2 %    Comment: Performed at [facility], [place] [address] [place], [place] [address]  Glucose, capillary     Status: Abnormal   Collection Time: 03/18/22 11:53 AM  Result Value Ref Range   Glucose-Capillary 132 (H) 70 - 99 mg/dL    Comment: Glucose reference range applies only to samples taken after fasting for at least 8 hours.    No results found.  Review of Systems negative except above specifically no change in bowel habits heartburn indigestion or swallowing problems or other GI complaints Blood pressure (!) 140/59, pulse 100, temperature 97.7 F (36.5 C), temperature source Oral, resp. rate 18, height '5\' 9"'$  (1.753 m), weight 108.9 kg, SpO2 99 %. Physical Exam vital signs stable afebrile no acute distress abdomen is soft nontender CT 3 years ago at urologist reviewed colonoscopy last year reviewed BUN and creatinine okay  Assessment/Plan: Symptomatic anemia concerns over GI blood loss Plan: We will allow soft  solids today and the risk benefits methods of endoscopy was discussed and we compared to the colonoscopy he has had and will proceed tomorrow morning with further work-up and plans and possibly even discharge after that procedure  [facility] 03/18/2022, 1:19 PM

## 2022-03-19 ENCOUNTER — Encounter (HOSPITAL_COMMUNITY): Payer: Self-pay | Admitting: Internal Medicine

## 2022-03-19 ENCOUNTER — Encounter (HOSPITAL_COMMUNITY)
Admission: EM | Disposition: A | Discharge status: Home / Self Care | Payer: Self-pay | Source: Home / Self Care | Attending: Student

## 2022-03-19 ENCOUNTER — Inpatient Hospital Stay (HOSPITAL_COMMUNITY): Payer: Medicare HMO | Admitting: Certified Registered Nurse Anesthetist

## 2022-03-19 DIAGNOSIS — K2289 Other specified disease of esophagus: Secondary | ICD-10-CM

## 2022-03-19 DIAGNOSIS — D649 Anemia, unspecified: Secondary | ICD-10-CM | POA: Diagnosis not present

## 2022-03-19 DIAGNOSIS — K449 Diaphragmatic hernia without obstruction or gangrene: Secondary | ICD-10-CM

## 2022-03-19 DIAGNOSIS — D509 Iron deficiency anemia, unspecified: Secondary | ICD-10-CM

## 2022-03-19 DIAGNOSIS — K259 Gastric ulcer, unspecified as acute or chronic, without hemorrhage or perforation: Secondary | ICD-10-CM

## 2022-03-19 HISTORY — PX: ESOPHAGOGASTRODUODENOSCOPY (EGD) WITH PROPOFOL: SHX5813

## 2022-03-19 HISTORY — PX: BIOPSY: SHX5522

## 2022-03-19 LAB — CBC
HCT: 28.8 % — ABNORMAL LOW (ref 39.0–52.0)
Hemoglobin: 8.9 g/dL — ABNORMAL LOW (ref 13.0–17.0)
MCH: 25.7 pg — ABNORMAL LOW (ref 26.0–34.0)
MCHC: 30.9 g/dL (ref 30.0–36.0)
MCV: 83.2 fL (ref 80.0–100.0)
Platelets: 286 10*3/uL (ref 150–400)
RBC: 3.46 MIL/uL — ABNORMAL LOW (ref 4.22–5.81)
RDW: 15.9 % — ABNORMAL HIGH (ref 11.5–15.5)
WBC: 5.3 10*3/uL (ref 4.0–10.5)
nRBC: 0 % (ref 0.0–0.2)

## 2022-03-19 LAB — RENAL FUNCTION PANEL
Albumin: 3.7 g/dL (ref 3.5–5.0)
Anion gap: 7 (ref 5–15)
BUN: 13 mg/dL (ref 8–23)
CO2: 24 mmol/L (ref 22–32)
Calcium: 9.2 mg/dL (ref 8.9–10.3)
Chloride: 111 mmol/L (ref 98–111)
Creatinine, Ser: 0.82 mg/dL (ref 0.61–1.24)
GFR, Estimated: >60 mL/min (ref >60)
Glucose, Bld: 138 mg/dL — ABNORMAL HIGH (ref 70–99)
Phosphorus: 3.6 mg/dL (ref 2.5–4.6)
Potassium: 3.8 mmol/L (ref 3.5–5.1)
Sodium: 142 mmol/L (ref 135–145)

## 2022-03-19 LAB — GLUCOSE, CAPILLARY
Glucose-Capillary: 134 mg/dL — ABNORMAL HIGH (ref 70–99)
Glucose-Capillary: 122 mg/dL — ABNORMAL HIGH (ref 70–99)

## 2022-03-19 LAB — MAGNESIUM: Magnesium: 2.1 mg/dL (ref 1.7–2.4)

## 2022-03-19 SURGERY — ESOPHAGOGASTRODUODENOSCOPY (EGD) WITH PROPOFOL
Anesthesia: Monitor Anesthesia Care

## 2022-03-19 MED ORDER — PROPOFOL 500 MG/50ML IV EMUL
INTRAVENOUS | Status: DC | PRN
  Administered 2022-03-19: 125 ug/kg/min via INTRAVENOUS

## 2022-03-19 MED ORDER — PROPOFOL 500 MG/50ML IV EMUL
INTRAVENOUS | Status: AC
  Filled 2022-03-19: qty 50

## 2022-03-19 MED ORDER — PROPOFOL 10 MG/ML IV BOLUS
INTRAVENOUS | Status: AC
  Filled 2022-03-19: qty 20

## 2022-03-19 MED ORDER — LIDOCAINE HCL (CARDIAC) PF 100 MG/5ML IV SOSY
PREFILLED_SYRINGE | INTRAVENOUS | Status: DC | PRN
  Administered 2022-03-19: 100 mg via INTRAVENOUS

## 2022-03-19 MED ORDER — SODIUM CHLORIDE 0.9 % IV SOLN: INTRAVENOUS | Status: DC

## 2022-03-19 MED ORDER — PROPOFOL 10 MG/ML IV BOLUS
INTRAVENOUS | Status: DC | PRN
  Administered 2022-03-19 (×2): 20 mg via INTRAVENOUS
  Administered 2022-03-19: 40 mg via INTRAVENOUS
  Administered 2022-03-19: 20 mg via INTRAVENOUS

## 2022-03-19 MED ORDER — PANTOPRAZOLE SODIUM 40 MG PO TBEC: 40.0000 mg | DELAYED_RELEASE_TABLET | Freq: Two times a day (BID) | ORAL | 0 refills | Status: DC

## 2022-03-19 SURGICAL SUPPLY — 15 items

## 2022-03-19 NOTE — Op Note (Signed)
[facility] Patient Name: Kevin Olson Procedure Date: 03/19/2022 MRN: [id number] Attending MD: Clarene Essex , MD Date of Birth: 1946/05/04 CSN: [id number] Age: 76 Admit Type: Inpatient Procedure:                Upper GI endoscopy Indications:              Iron deficiency anemia in patient on aspirin and                            Celebrex Providers:                Clarene Essex, MD, Elmer Ramp. Tilden Dome, RN, Engineer, civil (consulting), Merchant navy officer Referring MD:              Medicines:                Monitored Anesthesia Care Complications:            No immediate complications. Estimated Blood Loss:     Estimated blood loss: none. Procedure:                Pre-Anesthesia Assessment:                           - Prior to the procedure, a History and Physical                            was performed, and patient medications and                            allergies were reviewed. The patient's tolerance of                            previous anesthesia was also reviewed. The risks                            and benefits of the procedure and the sedation                            options and risks were discussed with the patient.                            All questions were answered, and informed consent                            was obtained. Prior Anticoagulants: The patient has                            taken no previous anticoagulant or antiplatelet                            agents except for aspirin. ASA Grade Assessment: II                            -  A patient with mild systemic disease. After                            reviewing the risks and benefits, the patient was                            deemed in satisfactory condition to undergo the                            procedure.                           After obtaining informed consent, the endoscope was                            passed under direct vision. Throughout the                             procedure, the patient's blood pressure, pulse, and                            oxygen saturations were monitored continuously. The                            [id number]-H190 ([id number]) Olympus endoscope was introduced                            through the mouth, and advanced to the third part                            of duodenum. The upper GI endoscopy was                            accomplished without difficulty. The patient                            tolerated the procedure well. Scope In: Scope Out: Findings:      A small hiatal hernia was present.      There were esophageal mucosal changes consistent with short-segment       Barrett's esophagus present in the lower third of the esophagus.       Biopsies were taken with a cold forceps for histology.      Few non-bleeding linear and superficial gastric ulcers with no stigmata       of bleeding were found in the gastric body. Biopsies were taken with a       cold forceps for histology.      The duodenal bulb, first portion of the duodenum, second portion of the       duodenum and third portion of the duodenum were normal.      The exam was otherwise without abnormality. Impression:               - Small hiatal hernia.                           - Esophageal mucosal changes  consistent with                            short-segment Barrett's esophagus. Biopsied.                           - Non-bleeding gastric ulcers with no stigmata of                            bleeding. Biopsied.                           - Normal duodenal bulb, first portion of the                            duodenum, second portion of the duodenum and third                            portion of the duodenum.                           - The examination was otherwise normal. Moderate Sedation:      Not Applicable - Patient had care per Anesthesia. Recommendation:           - Soft diet today. Okay with me to go home                           - Continue present  medications. Hold aspirin and                            Celebrex for now and use twice a day pump inhibitor                            and okay to start iron and watch for constipation                            and dark stools from that                           - Await pathology results.                           - Return to GI clinic in 4 weeks. And we will                            decide if follow-up endoscopy or other work-up and                            plans are needed like CT or capsule endoscopy                           - Telephone GI clinic for pathology results in 1  week.                           - Telephone GI clinic if symptomatic PRN. And call                            [place] sooner if question or problem Procedure Code(s):        --- Professional ---                           [phone], Esophagogastroduodenoscopy, flexible,                            transoral; with biopsy, single or multiple Diagnosis Code(s):        --- Professional ---                           K44.9, Diaphragmatic hernia without obstruction or                            gangrene                           K22.8, Other specified diseases of esophagus                           K25.9, Gastric ulcer, unspecified as acute or                            chronic, without hemorrhage or perforation                           D50.9, Iron deficiency anemia, unspecified CPT copyright 2019 American Medical Association. All rights reserved. The codes documented in this report are preliminary and upon coder review may  be revised to meet current compliance requirements. Clarene Essex, MD 03/19/2022 12:36:23 PM This report has been signed electronically. Number of Addenda: 0

## 2022-03-19 NOTE — Discharge Summary (Signed)
Physician Discharge Summary  Virgle Arth BMW:[id number] DOB: July 16, 1946 DOA: 03/17/2022  PCP: Aurea Graff, PA-C (Inactive)  Admit date: 03/17/2022 Discharge date: 03/19/2022  Admitted From: Home Disposition: Home  Recommendations for Outpatient Follow-up:  Follow up with PCP in 1 week Follow up with Dr. Watt Climes in 4 weeks Please obtain CBC in 1 week to ensure stability of hemoglobin Please follow up on the following pending results: Pathology results from upper endoscopy  Home Health: no   Equipment/Devices: None  Discharge Condition: Good CODE STATUS: Full Diet recommendation: Soft and then progressing  Brief/Interim Summary: From H&P: From Dr. Francia Greaves on 03/17/22: "Kevin Olson is a 76 y.o. male with medical history significant for essential hypertension, hyperlipidemia, hypothyroidism, type 2 diabetes, iron deficiency anemia, chronic anxiety/depression, BPH, spinal stenosis on daily Celebrex, coronary artery disease on daily aspirin, who presented to [facility] ED at the recommendation of his PCP due to abnormal labs with hemoglobin of 6.7K.  Endorses exertional dyspnea and palpitations with walking x1 week.  Denies hematemesis, melena or hematochezia.  He is on daily aspirin 81 mg and daily Celebrex.     Upon presentation to the ED, repeated hemoglobin 7.0.  BP stable, not hypotensive.  2 units PRBCs ordered by EDP to be transfused.  [facility] consulted by EDP.  TR[facility], hospitalist service, asked to admit."  Interim: Patient felt much better after receiving the blood transfusion.  He was not having any nausea, vomiting, abdominal pain, or blood in his stool/urine.  Subjective on day of discharge: Patient continues to feel well.  No blood in his stool/urine, pain, nausea/vomiting, chest pain, or shortness of breath.  He has an upper endoscopy scheduled that ended up showing no active bleeding.  Discharge Diagnoses:  Principal Problem:   Symptomatic anemia Active Problems:   Bilateral  knee pain   Spinal stenosis of lumbar region   Malignant neoplasm of prostate (HCC)   Status post total left knee replacement   S/P total knee arthroplasty, right   Iron deficiency anemia   Hypothyroidism   Personal history of colonic polyps   Osteoarthritis   Diabetes mellitus type 2, noninsulin dependent ([place])   Morbid obesity ([place])  Patient is hemoglobin recovered nicely after receiving 2 units of packed red blood cells.  EGD was unremarkable for active bleeding.  He will go back on oral iron up to 3 times daily as his stomach will allow in addition to holding his aspirin and Celebrex until follow-up.  He will also continue Protonix 40 mg twice daily.  He will call the office in 1 week for pathology results and follow-up in their office in 4 weeks.  The patient will continue his home medicines otherwise.  Discharge Instructions  Discharge Instructions     Call MD for:  persistant dizziness or light-headedness   Complete by: As directed    Diet - low sodium heart healthy   Complete by: As directed    Increase activity slowly   Complete by: As directed    No wound care   Complete by: As directed       Allergies as of 03/19/2022   No Known Allergies      Medication List     STOP taking these medications    aspirin EC 81 MG tablet   celecoxib 200 MG capsule Commonly known as: CELEBREX   docusate sodium 100 MG capsule Commonly known as: Colace   methocarbamol 500 MG tablet Commonly known as: ROBAXIN  TAKE these medications    albuterol 108 (90 Base) MCG/ACT inhaler Commonly known as: VENTOLIN HFA Inhale 1 puff into the lungs every 4 (four) hours as needed for wheezing or shortness of breath.   ALPRAZolam 1 MG tablet Commonly known as: XANAX Take 1 mg by mouth at bedtime.   atorvastatin 10 MG tablet Commonly known as: LIPITOR Take 10 mg by mouth every evening.   baclofen 10 MG tablet Commonly known as: LIORESAL Take 10-20 mg by mouth 2 (two) times  daily. Take 1 tablet (10 mg) in the morning and Take 2 tablets (20 mg) at bedtime   DULoxetine 60 MG capsule Commonly known as: CYMBALTA Take 60 mg by mouth every evening.   ferrous sulfate 325 (65 FE) MG tablet Take 325 mg by mouth 3 (three) times daily with meals.   glimepiride 4 MG tablet Commonly known as: AMARYL Take 4 mg by mouth daily with breakfast.   levothyroxine 100 MCG tablet Commonly known as: SYNTHROID Take 100 mcg by mouth daily before breakfast.   losartan 25 MG tablet Commonly known as: COZAAR Take 12.5 mg by mouth every evening.   metFORMIN 1000 MG tablet Commonly known as: GLUCOPHAGE Take 1,000 mg by mouth in the morning and at bedtime.   Multivitamin Adult Chew Chew 6 tablets by mouth every evening. Fruits & Veggies   pantoprazole 40 MG tablet Commonly known as: Protonix Take 1 tablet (40 mg total) by mouth 2 (two) times daily.   pioglitazone 15 MG tablet Commonly known as: ACTOS Take 15 mg by mouth daily.   QUNOL ULTRA COQ10 PO Take 1 capsule by mouth at bedtime.   Repatha SureClick 096 MG/ML [place] Generic drug: Evolocumab INJECT 140MG SUBCUTANEOUSLY EVERY 2 WEEKS (14 DAYS) AS DIRECTED   tamsulosin 0.4 MG Caps capsule Commonly known as: FLOMAX Take 0.8 mg by mouth in the morning.   True Metrix Air Glucose Meter Engineer, materials Glucose Meter kit   True Metrix Blood Glucose Test test strip Generic drug: glucose blood True Metrix Glucose Test Strip   TRUEplus Lancets 33G Misc TRUEplus Lancets 33 gauge   Vitamin D3 50 MCG (2000 UT) Tabs Take 2,000 Units by mouth every morning.        Follow-up Information     Clarene Essex, MD. Call in 4 week(s).   Specialty: Gastroenterology Contact information: [address] [place] [address] [phone]                No Known Allergies  Consultations: GI  Procedures/Studies: Upper endoscopy w Dr. Watt Climes on 03/19/22  Discharge Exam: Vitals:   03/19/22 1400  03/19/22 1402  BP: (!) 136/52 136/62  Pulse: (!) 53 (!) 52  Resp: 20   Temp: 97.6 F (36.4 C)   SpO2: 100%      General: Pt is alert, awake, not in acute distress Cardiovascular: RRR, S1/S2 +, no edema Respiratory: CTA bilaterally, no wheezing, no rhonchi, no respiratory distress, no conversational dyspnea  Abdominal: Soft, NT, ND, bowel sounds + Extremities: no edema, no cyanosis Psych: Normal mood and affect, stable judgement and insight     The results of significant diagnostics from this hospitalization (including imaging, microbiology, ancillary and laboratory) are listed below for reference.     Labs: Basic Metabolic Panel: Recent Labs  Lab 03/17/22 1709 03/18/22 0820 03/19/22 0728  NA 140 140 142  K 4.2 4.6 3.8  CL 109 108 111  CO2 23 22 24   GLUCOSE 80  129* 138*  BUN 22 17 13   CREATININE 0.82 0.88 0.82  CALCIUM 9.4 8.8* 9.2  MG  --  2.3 2.1  PHOS  --  4.2 3.6   Liver Function Tests: Recent Labs  Lab 03/17/22 1709 03/18/22 0820 03/19/22 0728  AST 18 33  --   ALT 11 7  --   ALKPHOS 72 62  --   BILITOT 1.0 1.7*  --   PROT 7.1 6.4*  --   ALBUMIN 3.9 3.4* 3.7   CBC: Recent Labs  Lab 03/17/22 1709 03/18/22 0820 03/18/22 1133 03/19/22 0728  WBC 4.9 4.4 5.2 5.3  NEUTROABS 2.9 2.7  --   --   HGB 7.0* 8.5* 8.5* 8.9*  HCT 23.7* 28.6* 27.8* 28.8*  MCV 83.7 85.1 83.2 83.2  PLT 289 283 260 286   CBG: Recent Labs  Lab 03/18/22 1153 03/18/22 1634 03/18/22 2104 03/19/22 0734 03/19/22 1259  GLUCAP 132* 206* 97 134* 122*   Hgb A1c Recent Labs    03/17/22 1709  HGBA1C 6.3*   Sepsis Labs Recent Labs  Lab 03/17/22 1709 03/18/22 0820 03/18/22 1133 03/19/22 0728  WBC 4.9 4.4 5.2 5.3    Patient was seen and examined on the day of discharge and was found to be in stable condition. Time coordinating discharge: 25 minutes including assessment and coordination of care, as well as examination of the patient.   SIGNED:  Shelda Pal,  DO [facility] 03/19/2022, 2:52 PM

## 2022-03-19 NOTE — Progress Notes (Signed)
Patient discharged to home with family, discharge instructions reviewed with patient who verbalized understanding. 

## 2022-03-19 NOTE — Progress Notes (Signed)
.   Transition of Care [facility]) Screening Note   Patient Details  Name: Kevin Olson Date of Birth: June 18, 1946   Transition of Care [facility]) CM/SW Contact:    Illene Regulus, LCSW Phone Number: 03/19/2022, 9:26 AM    Transition of Care Department [facility]) has reviewed patient and no TOC needs have been identified at this time. We will continue to monitor patient advancement through interdisciplinary progression rounds. If new patient transition needs arise, please place a TOC consult.

## 2022-03-19 NOTE — Transfer of Care (Signed)
Immediate Anesthesia Transfer of Care Note  Patient: Kevin Olson  Procedure(s) Performed: ESOPHAGOGASTRODUODENOSCOPY (EGD) WITH PROPOFOL BIOPSY  Patient Location: Endoscopy Unit  Anesthesia Type:MAC  Level of Consciousness: awake, alert , oriented and patient cooperative  Airway & Oxygen Therapy: Patient Spontanous Breathing  Post-op Assessment: Report given to RN and Post -op Vital signs reviewed and stable  Post vital signs: Reviewed and stable  Last Vitals:  Vitals Value Taken Time  BP 127/44 1225  Temp    Pulse 63 03/19/22 1225  Resp 14 03/19/22 1225  SpO2 94 % 03/19/22 1225    Last Pain:  Vitals:   03/19/22 1140  TempSrc: Tympanic  PainSc: 0-No pain         Complications: No notable events documented.

## 2022-03-19 NOTE — Progress Notes (Signed)
Kevin Olson 12:06 PM  Subjective: Patient doing better without signs of bleeding and no new complaints ready for endoscopy  Objective: Vital signs stable afebrile no acute distress exam please see preassessment evaluation hemoglobin increased  Assessment: Anemia and patient on Celebrex and aspirin which we discussed  Plan: Okay to proceed with endoscopy with anesthesia assistance  [facility]  office [phone] After 5PM or if no answer call [phone]

## 2022-03-19 NOTE — Anesthesia Postprocedure Evaluation (Signed)
Anesthesia Post Note  Patient: Kevin Olson  Procedure(s) Performed: ESOPHAGOGASTRODUODENOSCOPY (EGD) WITH PROPOFOL BIOPSY     Patient location during evaluation: Endoscopy Anesthesia Type: MAC Level of consciousness: awake and alert Pain management: pain level controlled Vital Signs Assessment: post-procedure vital signs reviewed and stable Respiratory status: spontaneous breathing, nonlabored ventilation, respiratory function stable and patient connected to nasal cannula oxygen Cardiovascular status: blood pressure returned to baseline and stable Postop Assessment: no apparent nausea or vomiting Anesthetic complications: no   No notable events documented.  Last Vitals:  Vitals:   03/19/22 1240 03/19/22 1245  BP: (!) 127/53   Pulse: (!) 56 (!) 55  Resp: 17 11  Temp:    SpO2: 93% 98%    Last Pain:  Vitals:   03/19/22 1240  TempSrc:   PainSc: 0-No pain                 Rivkah Wolz DANIEL

## 2022-03-19 NOTE — Anesthesia Preprocedure Evaluation (Addendum)
Anesthesia Evaluation  Patient identified by MRN, date of birth, ID band Patient awake    Reviewed: Allergy & Precautions, H&P , NPO status , Patient's Chart, lab work & pertinent test results  History of Anesthesia Complications Negative for: history of anesthetic complications  Airway Mallampati: III  TM Distance: >3 FB Neck ROM: Full    Dental no notable dental hx. (+) Dental Advisory Given   Pulmonary neg pulmonary ROS, former smoker,    Pulmonary exam normal        Cardiovascular hypertension, Pt. on medications Normal cardiovascular exam  Normal stress test, LVEF 62%   Neuro/Psych PSYCHIATRIC DISORDERS Anxiety Depression negative neurological ROS     GI/Hepatic negative GI ROS, Neg liver ROS,   Endo/Other  diabetes, Type 2Hypothyroidism   Renal/GU negative Renal ROS   Prostate cancer    Musculoskeletal  (+) Arthritis , Osteoarthritis,    Abdominal   Peds  Hematology  (+) Blood dyscrasia, anemia ,   Anesthesia Other Findings   Reproductive/Obstetrics                           Anesthesia Physical  Anesthesia Plan  ASA: 3  Anesthesia Plan: MAC   Post-op Pain Management: GA combined w/ Regional for post-op pain and Minimal or no pain anticipated   Induction:   PONV Risk Score and Plan: 1 and Propofol infusion and TIVA  Airway Management Planned: Simple Face Mask and Natural Airway  Additional Equipment: None  Intra-op Plan:   Post-operative Plan:   Informed Consent: I have reviewed the patients History and Physical, chart, labs and discussed the procedure including the risks, benefits and alternatives for the proposed anesthesia with the patient or authorized representative who has indicated his/her understanding and acceptance.     Dental advisory given  Plan Discussed with: Anesthesiologist and CRNA  Anesthesia Plan Comments:        Anesthesia Quick  Evaluation

## 2022-03-19 NOTE — Discharge Instructions (Signed)
You were cared for by a hospitalist during your hospital stay. If you have any questions about your discharge medications or the care you received while you were in the hospital after you are discharged, you can call the unit and ask to speak with the hospitalist on call if the hospitalist that took care of you is not available. Once you are discharged, your primary care physician will handle any further medical issues. Please note that NO REFILLS for any discharge medications will be authorized once you are discharged, as it is imperative that you return to your primary care physician (or establish a relationship with a primary care physician if you do not have one) for your aftercare needs so that they can reassess your need for medications and monitor your lab values.  Hold off on aspirin and Celebrex until follow up with your PCP.   Take an over the counter iron supplement (Ferrous sulfate) up to times daily as your stomach will allow.  Please follow up with Dr. Perley Jain team in 4 weeks and call their office in 1 week for your pathology results (from procedure today).

## 2022-03-22 ENCOUNTER — Other Ambulatory Visit: Payer: Self-pay

## 2022-03-22 ENCOUNTER — Encounter (HOSPITAL_COMMUNITY): Payer: Self-pay | Admitting: Gastroenterology

## 2022-03-22 ENCOUNTER — Inpatient Hospital Stay: Payer: Medicare HMO | Attending: Hematology and Oncology | Admitting: Hematology and Oncology

## 2022-03-22 ENCOUNTER — Inpatient Hospital Stay: Payer: Medicare HMO

## 2022-03-22 VITALS — BP 137/68 | HR 81 | Temp 97.7°F | Resp 18 | Ht 69.0 in | Wt 237.7 lb

## 2022-03-22 DIAGNOSIS — D509 Iron deficiency anemia, unspecified: Secondary | ICD-10-CM | POA: Diagnosis not present

## 2022-03-22 DIAGNOSIS — Z8546 Personal history of malignant neoplasm of prostate: Secondary | ICD-10-CM | POA: Diagnosis not present

## 2022-03-22 DIAGNOSIS — D500 Iron deficiency anemia secondary to blood loss (chronic): Secondary | ICD-10-CM

## 2022-03-22 DIAGNOSIS — E119 Type 2 diabetes mellitus without complications: Secondary | ICD-10-CM | POA: Diagnosis not present

## 2022-03-22 DIAGNOSIS — I10 Essential (primary) hypertension: Secondary | ICD-10-CM | POA: Insufficient documentation

## 2022-03-22 DIAGNOSIS — Z800 Family history of malignant neoplasm of digestive organs: Secondary | ICD-10-CM | POA: Diagnosis not present

## 2022-03-22 DIAGNOSIS — Z87891 Personal history of nicotine dependence: Secondary | ICD-10-CM | POA: Diagnosis not present

## 2022-03-22 LAB — CBC WITH DIFFERENTIAL/PLATELET
Abs Immature Granulocytes: 0.01 10*3/uL (ref 0.00–0.07)
Basophils Absolute: 0.1 10*3/uL (ref 0.0–0.1)
Basophils Relative: 1 %
Eosinophils Absolute: 0.2 10*3/uL (ref 0.0–0.5)
Eosinophils Relative: 3 %
HCT: 29.4 % — ABNORMAL LOW (ref 39.0–52.0)
Hemoglobin: 9.2 g/dL — ABNORMAL LOW (ref 13.0–17.0)
Immature Granulocytes: 0 %
Lymphocytes Relative: 17 %
Lymphs Abs: 1 10*3/uL (ref 0.7–4.0)
MCH: 26.6 pg (ref 26.0–34.0)
MCHC: 31.3 g/dL (ref 30.0–36.0)
MCV: 85 fL (ref 80.0–100.0)
Monocytes Absolute: 0.5 10*3/uL (ref 0.1–1.0)
Monocytes Relative: 8 %
Neutro Abs: 4.4 10*3/uL (ref 1.7–7.7)
Neutrophils Relative %: 71 %
Platelets: 247 10*3/uL (ref 150–400)
RBC: 3.46 MIL/uL — ABNORMAL LOW (ref 4.22–5.81)
RDW: 17.7 % — ABNORMAL HIGH (ref 11.5–15.5)
WBC: 6.2 10*3/uL (ref 4.0–10.5)
nRBC: 0 % (ref 0.0–0.2)

## 2022-03-22 LAB — IRON AND IRON BINDING CAPACITY (CC-WL,HP ONLY)
Iron: 73 ug/dL (ref 45–182)
Saturation Ratios: 18 % (ref 17.9–39.5)
TIBC: 410 ug/dL (ref 250–450)
UIBC: 337 ug/dL (ref 117–376)

## 2022-03-22 LAB — TYPE AND SCREEN
ABO/RH(D): O POS
Antibody Screen: NEGATIVE

## 2022-03-22 LAB — FERRITIN: Ferritin: 187 ng/mL (ref 24–336)

## 2022-03-22 NOTE — Assessment & Plan Note (Signed)
This is a very pleasant 76 year old male patient with past medical history significant for diabetes, hypertension referred to hematology for evaluation and recommendations regarding iron deficiency anemia.  He was recently hospitalized for the same and received 2 units of packed red blood cells.  He denies any visible blood loss, however tested positive for fecal occult blood according to wife.  He had endoscopy in the hospital which was unrevealing, last colonoscopy was 6 to 7 months ago according to the patient.  He responded well to the packed red blood cell transfusion and feels better today.  Physical examination today is unremarkable except for pallor.  We have reviewed labs from his PCPs office as well as the recent hospitalization.  We have discussed that we will proceed with a repeat CBC, iron panel, ferritin today.  We have also recommended considering a repeat colonoscopy and a capsule endoscopy if colonoscopy turns out to be unremarkable. We have also discussed about intravenous iron if his ferritin levels are low today.  We have discussed about adverse effects from IV iron including but not limited to myalgias, headaches, rarely anaphylaxis.  He is agreeable to IV iron infusion if this is indicated. Given his family history of metastatic pancreatic cancer, have also recommended genetic testing, he is agreeable to this, referral to genetics placed.  He should return to clinic in 4 weeks.

## 2022-03-22 NOTE — Progress Notes (Signed)
[place] CONSULT NOTE  Patient Care Team: Aurea Graff, PA-C (Inactive) as PCP - General (Physician Assistant) Festus Aloe, MD as Consulting Physician (Urology) Cira Rue, RN Nurse Navigator as Registered Nurse (Medical Oncology) Tyler Pita, MD as Consulting Physician (Radiation Oncology)  CHIEF COMPLAINTS/PURPOSE OF CONSULTATION:  Anemia  ASSESSMENT & PLAN:  IDA (iron deficiency anemia) This is a very pleasant 76 year old male patient with past medical history significant for diabetes, hypertension referred to hematology for evaluation and recommendations regarding iron deficiency anemia.  He was recently hospitalized for the same and received 2 units of packed red blood cells.  He denies any visible blood loss, however tested positive for fecal occult blood according to wife.  He had endoscopy in the hospital which was unrevealing, last colonoscopy was 6 to 7 months ago according to the patient.  He responded well to the packed red blood cell transfusion and feels better today.  Physical examination today is unremarkable except for pallor.  We have reviewed labs from his PCPs office as well as the recent hospitalization.  We have discussed that we will proceed with a repeat CBC, iron panel, ferritin today.  We have also recommended considering a repeat colonoscopy and a capsule endoscopy if colonoscopy turns out to be unremarkable. We have also discussed about intravenous iron if his ferritin levels are low today.  We have discussed about adverse effects from IV iron including but not limited to myalgias, headaches, rarely anaphylaxis.  He is agreeable to IV iron infusion if this is indicated. Given his family history of metastatic pancreatic cancer, have also recommended genetic testing, he is agreeable to this, referral to genetics placed.  He should return to clinic in 4 weeks.  Orders Placed This Encounter  Procedures   CBC with Differential/Platelet     Standing Status:   Standing    Number of Occurrences:   22    Standing Expiration Date:   03/23/2023   Iron and Iron Binding Capacity ([facility] only)    Standing Status:   Future    Number of Occurrences:   1    Standing Expiration Date:   03/23/2023   Ferritin    Standing Status:   Future    Number of Occurrences:   1    Standing Expiration Date:   03/22/2023   Ambulatory referral to Genetics    Referral Priority:   Routine    Referral Type:   Consultation    Referral Reason:   Specialty Services Required    Number of Visits Requested:   1   Type and screen         Standing Status:   Future    Number of Occurrences:   1    Standing Expiration Date:   03/22/2023   This is a very pleasant 76 year old male patient with past medical history significant for type 2 diabetes, hypertension, dyslipidemia, history of prostate cancer 2 years ago currently on surveillance referred to hematology for evaluation of severe anemia.  He appears to have been admitted to the hospital and discharged on July 2 for severe anemia, required 2 units of packed red blood cells.  He had endoscopy during the hospitalization which was unremarkable.  He is establishing with hematology given ongoing severe anemia.  He denies any blood loss in his stools however his FOBT tested positive according to his wife.  His last colonoscopy was 6 to 7 months ago, I do not have access to this report but according  to the patient this was unremarkable except for some polyps.  No family history of colon cancer.  His half sister had pancreatic cancer.  He reports worsening fatigue for the past several months, shortness of breath on exertion.  No weight loss reported.  He is otherwise hard of hearing at baseline, has well-controlled diabetes and hypertension.  He has been taking oral iron supplementation for the past 1 week.  Rest of the pertinent 10 point ROS reviewed and negative  HISTORY OF PRESENTING ILLNESS:  Kevin Olson 76 y.o. male  is here because of symptomatic anemia.  REVIEW OF SYSTEMS:   Constitutional: Denies fevers, chills or abnormal night sweats Eyes: Denies blurriness of vision, double vision or watery eyes Ears, nose, mouth, throat, and face: Denies mucositis or sore throat Respiratory: Denies cough, dyspnea or wheezes Cardiovascular: Denies palpitation, chest discomfort or lower extremity swelling Gastrointestinal:  Denies nausea, heartburn or change in bowel habits Skin: Denies abnormal skin rashes Lymphatics: Denies new lymphadenopathy or easy bruising Neurological:Denies numbness, tingling or new weaknesses Behavioral/Psych: Mood is stable, no new changes  All other systems were reviewed with the patient and are negative.  MEDICAL HISTORY:  Past Medical History:  Diagnosis Date   Depression    DM2 (diabetes mellitus, type 2) (HCC)    Dyslipidemia    Hypertension    OA (osteoarthritis) of knee    Prostate cancer [facility])    prostate    SURGICAL HISTORY: Past Surgical History:  Procedure Laterality Date   BIOPSY  03/19/2022   Procedure: BIOPSY;  Surgeon: Clarene Essex, MD;  Location: [facility];  Service: Gastroenterology;;   ESOPHAGOGASTRODUODENOSCOPY (EGD) WITH PROPOFOL N/A 03/19/2022   Procedure: ESOPHAGOGASTRODUODENOSCOPY (EGD) WITH PROPOFOL;  Surgeon: Clarene Essex, MD;  Location: [facility];  Service: Gastroenterology;  Laterality: N/A;   INGUINAL HERNIA REPAIR     PROSTATE BIOPSY     TOTAL KNEE ARTHROPLASTY Left 10/26/2020   Procedure: TOTAL KNEE ARTHROPLASTY;  Surgeon: Paralee Cancel, MD;  Location: [facility];  Service: Orthopedics;  Laterality: Left;  70 mins   TOTAL KNEE ARTHROPLASTY Right 12/21/2020   Procedure: TOTAL KNEE ARTHROPLASTY;  Surgeon: Paralee Cancel, MD;  Location: [facility];  Service: Orthopedics;  Laterality: Right;  70 mins    SOCIAL HISTORY: Social History   Socioeconomic History   Marital status: Married    Spouse name: Not on file   Number of children: Not on file   Years  of education: Not on file   Highest education level: Not on file  Occupational History   Occupation: retired    Comment: Architect  Tobacco Use   Smoking status: Former    Packs/day: 2.00    Years: 12.00    Total pack years: 24.00    Types: Cigarettes    Quit date: 04/19/1979    Years since quitting: 42.9   Smokeless tobacco: Never  Vaping Use   Vaping Use: Never used  Substance and Sexual Activity   Alcohol use: Yes    Comment: occas.   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  Other Topics Concern   Not on file  Social History Narrative   Not on file   Social Determinants of Health   Financial Resource Strain: Not on file  Food Insecurity: Not on file  Transportation Needs: Not on file  Physical Activity: Not on file  Stress: Not on file  Social Connections: Not on file  Intimate  Partner Violence: Not on file    FAMILY HISTORY: Family History  Problem Relation Age of Onset   Pancreatic cancer Sister    Other Sister        brain tumor   CAD Mother    CAD Brother        CABGx4    CAD Brother        CABGx4   Colon cancer Neg Hx    Breast cancer Neg Hx     ALLERGIES:  has No Known Allergies.  MEDICATIONS:  Current Outpatient Medications  Medication Sig Dispense Refill   albuterol (VENTOLIN HFA) 108 (90 Base) MCG/ACT inhaler Inhale 1 puff into the lungs every 4 (four) hours as needed for wheezing or shortness of breath.     ALPRAZolam (XANAX) 1 MG tablet Take 1 mg by mouth at bedtime.     atorvastatin (LIPITOR) 10 MG tablet Take 10 mg by mouth every evening.     baclofen (LIORESAL) 10 MG tablet Take 10-20 mg by mouth 2 (two) times daily. Take 1 tablet (10 mg) in the morning and Take 2 tablets (20 mg) at bedtime     Blood Glucose Monitoring Suppl (TRUE METRIX AIR GLUCOSE METER) DEVI True Metrix Air Glucose Meter kit     Cholecalciferol (VITAMIN D3) 50 MCG (2000 UT) TABS Take 2,000 Units by mouth  every morning.     Coenzyme Q10-Vitamin E (QUNOL ULTRA COQ10 PO) Take 1 capsule by mouth at bedtime.     DULoxetine (CYMBALTA) 60 MG capsule Take 60 mg by mouth every evening.     ferrous sulfate 325 (65 FE) MG tablet Take 325 mg by mouth 3 (three) times daily with meals.     glimepiride (AMARYL) 4 MG tablet Take 4 mg by mouth daily with breakfast.     glucose blood (TRUE METRIX BLOOD GLUCOSE TEST) test strip True Metrix Glucose Test Strip     levothyroxine (SYNTHROID) 100 MCG tablet Take 100 mcg by mouth daily before breakfast.     losartan (COZAAR) 25 MG tablet Take 12.5 mg by mouth every evening.     metFORMIN (GLUCOPHAGE) 1000 MG tablet Take 1,000 mg by mouth in the morning and at bedtime.     Multiple Vitamins-Minerals (MULTIVITAMIN ADULT) CHEW Chew 6 tablets by mouth every evening. Fruits & Veggies     pantoprazole (PROTONIX) 40 MG tablet Take 1 tablet (40 mg total) by mouth 2 (two) times daily. 60 tablet 0   pioglitazone (ACTOS) 15 MG tablet Take 15 mg by mouth daily.     REPATHA SURECLICK 627 MG/ML SOAJ INJECT 140MG SUBCUTANEOUSLY EVERY 2 WEEKS (14 DAYS) AS DIRECTED 6 mL 3   tamsulosin (FLOMAX) 0.4 MG CAPS capsule Take 0.8 mg by mouth in the morning.     TRUEplus Lancets 33G MISC TRUEplus Lancets 33 gauge     No current facility-administered medications for this visit.     PHYSICAL EXAMINATION: ECOG PERFORMANCE STATUS: 0 - Asymptomatic  Vitals:   03/22/22 0950  BP: 137/68  Pulse: 81  Resp: 18  Temp: 97.7 F (36.5 C)  SpO2: 98%   Filed Weights   03/22/22 0950  Weight: 237 lb 11.2 oz (107.8 kg)    GENERAL:alert, no distress and comfortable SKIN: skin color, texture, turgor are normal, no rashes or significant lesions EYES: normal, conjunctiva are pink and non-injected, sclera clear OROPHARYNX:no exudate, no erythema and lips, buccal mucosa, and tongue normal  NECK: supple, thyroid normal size, non-tender, without nodularity LYMPH:  no  palpable lymphadenopathy in the  cervical, axillary or inguinal LUNGS: clear to auscultation and percussion with normal breathing effort HEART: regular rate & rhythm and no murmurs and no lower extremity edema ABDOMEN:abdomen soft, non-tender and normal bowel sounds Musculoskeletal:no cyanosis of digits and no clubbing  PSYCH: alert & oriented x 3 with fluent speech NEURO: no focal motor/sensory deficits  LABORATORY DATA:  I have reviewed the data as listed Lab Results  Component Value Date   WBC 5.3 03/19/2022   HGB 8.9 (L) 03/19/2022   HCT 28.8 (L) 03/19/2022   MCV 83.2 03/19/2022   PLT 286 03/19/2022     Chemistry      Component Value Date/Time   NA 142 03/19/2022 0728   NA 143 12/11/2019 1543   K 3.8 03/19/2022 0728   CL 111 03/19/2022 0728   CO2 24 03/19/2022 0728   BUN 13 03/19/2022 0728   BUN 17 12/11/2019 1543   CREATININE 0.82 03/19/2022 0728      Component Value Date/Time   CALCIUM 9.2 03/19/2022 0728   ALKPHOS 62 03/18/2022 0820   AST 33 03/18/2022 0820   ALT 7 03/18/2022 0820   BILITOT 1.7 (H) 03/18/2022 0820     Labs from March 09, 2022 showed iron saturation of 3%, TIBC of 496, serum iron of 17 and transferrin levels of 355 consistent with iron deficiency.  His most recent hemoglobin showed 7.6 g/dL, normocytic normochromic, no evidence of leukopenia or thrombocytopenia.  No evidence of kidney impairment, hypercalcemia or elevated total protein, normal liver function tests, no evidence of hypothyroidism TSH is 2.97  RADIOGRAPHIC STUDIES: I have personally reviewed the radiological images as listed and agreed with the findings in the report. No results found.  All questions were answered. The patient knows to call the clinic with any problems, questions or concerns. I spent 45 minutes in the care of this patient including H and P, review of records, counseling and coordination of care.     Benay Pike, MD 03/22/2022 10:34 AM

## 2022-03-23 ENCOUNTER — Telehealth: Payer: Self-pay | Admitting: Hematology and Oncology

## 2022-03-23 LAB — SURGICAL PATHOLOGY

## 2022-03-23 NOTE — Telephone Encounter (Signed)
.  Called patient to schedule appointment per 7/5 inbasket, patient wife is aware of date and time.

## 2022-03-24 ENCOUNTER — Encounter (HOSPITAL_COMMUNITY): Payer: Self-pay

## 2022-03-24 ENCOUNTER — Other Ambulatory Visit: Payer: Self-pay | Admitting: *Deleted

## 2022-03-24 ENCOUNTER — Ambulatory Visit (HOSPITAL_COMMUNITY)
Admission: EM | Admit: 2022-03-24 | Discharge: 2022-03-24 | Disposition: A | Discharge status: Home / Self Care | Payer: Medicare HMO | Attending: Family Medicine | Admitting: Family Medicine

## 2022-03-24 DIAGNOSIS — L03114 Cellulitis of left upper limb: Secondary | ICD-10-CM

## 2022-03-24 DIAGNOSIS — I808 Phlebitis and thrombophlebitis of other sites: Secondary | ICD-10-CM

## 2022-03-24 DIAGNOSIS — D500 Iron deficiency anemia secondary to blood loss (chronic): Secondary | ICD-10-CM

## 2022-03-24 DIAGNOSIS — L539 Erythematous condition, unspecified: Secondary | ICD-10-CM | POA: Diagnosis not present

## 2022-03-24 DIAGNOSIS — D509 Iron deficiency anemia, unspecified: Secondary | ICD-10-CM | POA: Diagnosis not present

## 2022-03-24 DIAGNOSIS — R195 Other fecal abnormalities: Secondary | ICD-10-CM | POA: Diagnosis not present

## 2022-03-24 MED ORDER — AMOXICILLIN-POT CLAVULANATE 875-125 MG PO TABS: 1.0000 | ORAL_TABLET | Freq: Two times a day (BID) | ORAL | 0 refills | Status: AC

## 2022-03-24 NOTE — ED Provider Notes (Signed)
[place]    CSN: [id number] Arrival date & time: 03/24/22  1649      History   Chief Complaint Chief Complaint  Patient presents with   Hand Pain    HPI Kevin Olson is a 76 y.o. male.    Hand Pain   Here with redness and tenderness and swelling where he had an IV done in the hospital recently.  He was admitted June 30 through July 2 for symptomatic anemia with a hemoglobin of 6.7.  He was transfused 2 units.  EGD did not show any bleeding sites on that study.  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   Essential tremor 03/18/2022   Hypothyroidism 03/18/2022   Mixed hyperlipidemia 03/18/2022   Personal history of colonic polyps 03/18/2022   Polyneuropathy 03/18/2022   Osteoarthritis 03/18/2022   Diabetes mellitus type 2, noninsulin dependent ([place]) 03/18/2022   Morbid obesity ([place]) 03/18/2022   Symptomatic anemia 03/17/2022   S/P total knee arthroplasty, right 12/21/2020   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/26/2020   Malignant neoplasm of prostate ([place]) 07/22/2019   Osteoarthritis of left knee 10/02/2018   Osteoarthritis of right knee 10/02/2018   Bilateral knee pain 09/03/2018   Low back pain 12/17/2017   Spinal stenosis of lumbar region 12/17/2017    Past Surgical History:  Procedure Laterality Date   BIOPSY  03/19/2022   Procedure: BIOPSY;  Surgeon: Clarene Essex, MD;  Location: [facility];  Service: Gastroenterology;;   ESOPHAGOGASTRODUODENOSCOPY (EGD) WITH PROPOFOL N/A 03/19/2022   Procedure: ESOPHAGOGASTRODUODENOSCOPY (EGD) WITH PROPOFOL;  Surgeon:  Clarene Essex, MD;  Location: [facility];  Service: Gastroenterology;  Laterality: N/A;   INGUINAL HERNIA REPAIR     PROSTATE BIOPSY     TOTAL KNEE ARTHROPLASTY Left 10/26/2020   Procedure: TOTAL KNEE ARTHROPLASTY;  Surgeon: Paralee Cancel, MD;  Location: [facility];  Service: Orthopedics;  Laterality: Left;  70 mins   TOTAL KNEE ARTHROPLASTY Right 12/21/2020   Procedure: TOTAL KNEE ARTHROPLASTY;  Surgeon: Paralee Cancel, MD;  Location: [facility];  Service: Orthopedics;  Laterality: Right;  70 mins       Home Medications    Prior to Admission medications   Medication Sig Start Date End Date Taking? Authorizing Provider  ALPRAZolam Duanne Moron) 1 MG tablet Take 1 mg by mouth at bedtime. 08/21/17  Yes [provider]  amoxicillin-clavulanate (AUGMENTIN) 875-125 MG tablet Take 1 tablet by mouth 2 (two) times daily for 7 days. 03/24/22 03/31/22 Yes Shannelle Alguire, Gwenlyn Perking, MD  atorvastatin (LIPITOR) 10 MG tablet Take 10 mg by mouth every evening. 09/06/20  Yes [provider]  baclofen (LIORESAL) 10 MG tablet Take 10-20 mg by mouth 2 (two) times daily. Take 1 tablet (10 mg) in the morning and Take 2 tablets (20 mg) at bedtime 08/27/17  Yes [provider]  Blood Glucose Monitoring Suppl (TRUE METRIX AIR GLUCOSE METER) DEVI True Metrix Air Glucose Meter kit   Yes [provider]  Cholecalciferol (VITAMIN D3) 50 MCG (2000 UT) TABS Take 2,000 Units by mouth  every morning.   Yes [provider]  Coenzyme Q10-Vitamin E (QUNOL ULTRA COQ10 PO) Take 1 capsule by mouth at bedtime.   Yes [provider]  DULoxetine (CYMBALTA) 60 MG capsule Take 60 mg by mouth every evening. 08/14/17  Yes [provider]  ferrous sulfate 325 (65 FE) MG tablet Take 325 mg by mouth 3 (three) times daily with meals.   Yes [provider]  glimepiride (AMARYL) 4 MG tablet Take 4 mg by mouth daily with breakfast. 08/14/17  Yes [provider]  glucose blood (TRUE METRIX  BLOOD GLUCOSE TEST) test strip True Metrix Glucose Test Strip   Yes [provider]  levothyroxine (SYNTHROID) 100 MCG tablet Take 100 mcg by mouth daily before breakfast. 05/22/19  Yes [provider]  losartan (COZAAR) 25 MG tablet Take 12.5 mg by mouth every evening. 12/24/13  Yes [provider]  metFORMIN (GLUCOPHAGE) 1000 MG tablet Take 1,000 mg by mouth in the morning and at bedtime.   Yes [provider]  pantoprazole (PROTONIX) 40 MG tablet Take 1 tablet (40 mg total) by mouth 2 (two) times daily. 03/19/22 04/18/22 Yes Wendling, Crosby Oyster, DO  pioglitazone (ACTOS) 15 MG tablet Take 15 mg by mouth daily. 08/14/17  Yes [provider]  REPATHA SURECLICK 774 MG/ML SOAJ INJECT 140MG SUBCUTANEOUSLY EVERY 2 WEEKS (14 DAYS) AS DIRECTED 06/09/21  Yes Hilty, Nadean Corwin, MD  tamsulosin (FLOMAX) 0.4 MG CAPS capsule Take 0.8 mg by mouth in the morning.   Yes [provider]  TRUEplus Lancets 33G MISC TRUEplus Lancets 9 gauge   Yes [provider]  Multiple Vitamins-Minerals (MULTIVITAMIN ADULT) CHEW Chew 6 tablets by mouth every evening. Fruits & Veggies    [provider]    Family History Family History  Problem Relation Age of Onset   Pancreatic cancer Sister    Other Sister        brain tumor   CAD Mother    CAD Brother        CABGx4    CAD Brother        CABGx4   Colon cancer Neg Hx    Breast cancer Neg Hx     Social History Social History   Tobacco Use   Smoking status: Former    Packs/day: 2.00    Years: 12.00    Total pack years: 24.00    Types: Cigarettes    Quit date: 04/19/1979    Years since quitting: 42.9   Smokeless tobacco: Never  Vaping Use   Vaping Use: Never used  Substance Use Topics   Alcohol use: Yes    Comment: occas.   Drug use: Never     Allergies   Patient has no known allergies.   Review of Systems Review of Systems   Physical Exam Triage Vital Signs ED Triage Vitals  Enc  Vitals Group     BP 03/24/22 1727 (!) 150/62     Pulse Rate 03/24/22 1727 62     Resp 03/24/22 1727 16     Temp 03/24/22 1727 98 F (36.7 C)     Temp Source 03/24/22 1727 Oral     SpO2 03/24/22 1727 97 %     Weight 03/24/22 1725 243 lb (110.2 kg)     Height 03/24/22 1725 5' 10"  (1.778 m)     Head Circumference --      Peak Flow --      Pain Score 03/24/22 1724 8  Pain Loc --      Pain Edu? --      Excl. in [place]? --    No data found.  Updated Vital Signs BP (!) 150/62 (BP Location: Right Arm)   Pulse 62   Temp 98 F (36.7 C) (Oral)   Resp 16   Ht 5' 10"  (1.778 m)   Wt 110.2 kg   SpO2 97%   BMI 34.87 kg/m   Visual Acuity Right Eye Distance:   Left Eye Distance:   Bilateral Distance:    Right Eye Near:   Left Eye Near:    Bilateral Near:     Physical Exam Vitals reviewed.  Constitutional:      General: He is not in acute distress.    Appearance: He is not ill-appearing, toxic-appearing or diaphoretic.  Skin:    Coloration: Skin is not jaundiced or pale.     Comments: On his left forearm there is an area of erythema and induration about 5 cm x 4 cm.  There is no fluctuance.  It is tender  Neurological:     Mental Status: He is alert and oriented to person, place, and time.  Psychiatric:        Behavior: Behavior normal.      UC Treatments / Results  Labs (all labs ordered are listed, but only abnormal results are displayed) Labs Reviewed - No data to display  EKG   Radiology No results found.  Procedures Procedures (including critical care time)  Medications Ordered in UC Medications - No data to display  Initial Impression / Assessment and Plan / UC Course  I have reviewed the triage vital signs and the nursing notes.  Pertinent labs & imaging results that were available during my care of the patient were reviewed by me and considered in my medical decision making (see chart for details).     I will treat with Augmentin for cellulitis and  superficial thrombophlebitis.  I am not going to do any anti-inflammatories due to his recent illness. Final Clinical Impressions(s) / UC Diagnoses   Final diagnoses:  Cellulitis of left upper extremity  Superficial thrombophlebitis of left upper extremity     Discharge Instructions      Take amoxicillin-clavulanate 875 mg--1 tab twice daily with food for 7 days,me  Warm compresses to the sore area.  Tylenol as needed for pain     ED Prescriptions     Medication Sig Dispense Auth. Provider   amoxicillin-clavulanate (AUGMENTIN) 875-125 MG tablet Take 1 tablet by mouth 2 (two) times daily for 7 days. 14 tablet Yanet Balliet, Gwenlyn Perking, MD      PDMP not reviewed this encounter.   Barrett Henle, MD 03/24/22 Tresa Moore

## 2022-03-24 NOTE — ED Triage Notes (Signed)
Patient having left hand pain. Was in the hospital and had a port on his left hand. States his left hand is red and sensitive.   Patient was in the hospital this past weekend.

## 2022-03-24 NOTE — Discharge Instructions (Addendum)
Take amoxicillin-clavulanate 875 mg--1 tab twice daily with food for 7 days,me  Warm compresses to the sore area.  Tylenol as needed for pain

## 2022-03-28 ENCOUNTER — Telehealth: Payer: Self-pay | Admitting: Hematology and Oncology

## 2022-03-28 NOTE — Telephone Encounter (Signed)
.  Called patient to schedule appointment per 7/10 inbasket, patient is aware of date and time.   

## 2022-03-30 DIAGNOSIS — E611 Iron deficiency: Secondary | ICD-10-CM | POA: Diagnosis not present

## 2022-03-30 DIAGNOSIS — D509 Iron deficiency anemia, unspecified: Secondary | ICD-10-CM | POA: Diagnosis not present

## 2022-03-30 DIAGNOSIS — D649 Anemia, unspecified: Secondary | ICD-10-CM | POA: Diagnosis not present

## 2022-04-06 DIAGNOSIS — D509 Iron deficiency anemia, unspecified: Secondary | ICD-10-CM | POA: Diagnosis not present

## 2022-04-10 ENCOUNTER — Other Ambulatory Visit: Payer: Self-pay | Admitting: Family Medicine

## 2022-04-10 ENCOUNTER — Other Ambulatory Visit: Payer: Self-pay

## 2022-04-10 ENCOUNTER — Inpatient Hospital Stay: Payer: Medicare HMO

## 2022-04-10 DIAGNOSIS — Z87891 Personal history of nicotine dependence: Secondary | ICD-10-CM | POA: Diagnosis not present

## 2022-04-10 DIAGNOSIS — I10 Essential (primary) hypertension: Secondary | ICD-10-CM | POA: Diagnosis not present

## 2022-04-10 DIAGNOSIS — D509 Iron deficiency anemia, unspecified: Secondary | ICD-10-CM | POA: Diagnosis not present

## 2022-04-10 DIAGNOSIS — D500 Iron deficiency anemia secondary to blood loss (chronic): Secondary | ICD-10-CM

## 2022-04-10 DIAGNOSIS — E119 Type 2 diabetes mellitus without complications: Secondary | ICD-10-CM | POA: Diagnosis not present

## 2022-04-10 DIAGNOSIS — Z800 Family history of malignant neoplasm of digestive organs: Secondary | ICD-10-CM | POA: Diagnosis not present

## 2022-04-10 DIAGNOSIS — Z8546 Personal history of malignant neoplasm of prostate: Secondary | ICD-10-CM | POA: Diagnosis not present

## 2022-04-10 LAB — CBC WITH DIFFERENTIAL/PLATELET
Abs Immature Granulocytes: 0.01 10*3/uL (ref 0.00–0.07)
Basophils Absolute: 0 10*3/uL (ref 0.0–0.1)
Basophils Relative: 1 %
Eosinophils Absolute: 0.1 10*3/uL (ref 0.0–0.5)
Eosinophils Relative: 1 %
HCT: 33.1 % — ABNORMAL LOW (ref 39.0–52.0)
Hemoglobin: 10.7 g/dL — ABNORMAL LOW (ref 13.0–17.0)
Immature Granulocytes: 0 %
Lymphocytes Relative: 20 %
Lymphs Abs: 1.1 10*3/uL (ref 0.7–4.0)
MCH: 27.4 pg (ref 26.0–34.0)
MCHC: 32.3 g/dL (ref 30.0–36.0)
MCV: 84.7 fL (ref 80.0–100.0)
Monocytes Absolute: 0.6 10*3/uL (ref 0.1–1.0)
Monocytes Relative: 10 %
Neutro Abs: 3.9 10*3/uL (ref 1.7–7.7)
Neutrophils Relative %: 68 %
Platelets: 260 10*3/uL (ref 150–400)
RBC: 3.91 MIL/uL — ABNORMAL LOW (ref 4.22–5.81)
RDW: 19.5 % — ABNORMAL HIGH (ref 11.5–15.5)
WBC: 5.8 10*3/uL (ref 4.0–10.5)
nRBC: 0 % (ref 0.0–0.2)

## 2022-04-10 LAB — IRON AND IRON BINDING CAPACITY (CC-WL,HP ONLY)
Iron: 19 ug/dL — ABNORMAL LOW (ref 45–182)
Saturation Ratios: 6 % — ABNORMAL LOW (ref 17.9–39.5)
TIBC: 302 ug/dL (ref 250–450)
UIBC: 283 ug/dL (ref 117–376)

## 2022-04-10 LAB — SAMPLE TO BLOOD BANK

## 2022-04-10 LAB — FERRITIN: Ferritin: 72 ng/mL (ref 24–336)

## 2022-04-19 ENCOUNTER — Other Ambulatory Visit: Payer: Self-pay

## 2022-04-19 ENCOUNTER — Inpatient Hospital Stay: Payer: Medicare HMO | Attending: Hematology and Oncology | Admitting: Hematology and Oncology

## 2022-04-19 ENCOUNTER — Encounter: Payer: Self-pay | Admitting: Hematology and Oncology

## 2022-04-19 VITALS — BP 143/61 | HR 108 | Temp 97.7°F | Resp 18 | Ht 70.0 in | Wt 231.4 lb

## 2022-04-19 DIAGNOSIS — E119 Type 2 diabetes mellitus without complications: Secondary | ICD-10-CM | POA: Insufficient documentation

## 2022-04-19 DIAGNOSIS — Z7984 Long term (current) use of oral hypoglycemic drugs: Secondary | ICD-10-CM | POA: Insufficient documentation

## 2022-04-19 DIAGNOSIS — Z87891 Personal history of nicotine dependence: Secondary | ICD-10-CM | POA: Diagnosis not present

## 2022-04-19 DIAGNOSIS — D509 Iron deficiency anemia, unspecified: Secondary | ICD-10-CM | POA: Diagnosis not present

## 2022-04-19 DIAGNOSIS — Z79899 Other long term (current) drug therapy: Secondary | ICD-10-CM | POA: Diagnosis not present

## 2022-04-19 DIAGNOSIS — I10 Essential (primary) hypertension: Secondary | ICD-10-CM | POA: Insufficient documentation

## 2022-04-19 DIAGNOSIS — Z8546 Personal history of malignant neoplasm of prostate: Secondary | ICD-10-CM | POA: Diagnosis not present

## 2022-04-19 DIAGNOSIS — E782 Mixed hyperlipidemia: Secondary | ICD-10-CM | POA: Diagnosis not present

## 2022-04-19 DIAGNOSIS — D500 Iron deficiency anemia secondary to blood loss (chronic): Secondary | ICD-10-CM | POA: Diagnosis not present

## 2022-04-19 NOTE — Progress Notes (Signed)
[place] CONSULT NOTE  Patient Care Team: Aurea Graff, PA-C (Inactive) as PCP - General (Physician Assistant) Festus Aloe, MD as Consulting Physician (Urology) Cira Rue, RN Nurse Navigator as Registered Nurse (Medical Oncology) Tyler Pita, MD as Consulting Physician (Radiation Oncology)  CHIEF COMPLAINTS/PURPOSE OF CONSULTATION:  Anemia  ASSESSMENT & PLAN:   This is a very pleasant 76 year old male patient with past medical history significant for type 2 diabetes, hypertension, dyslipidemia, history of prostate cancer 2 years ago currently on surveillance referred to hematology for evaluation of severe anemia.  He appears to have been admitted to the hospital and discharged on July 2 for severe anemia, required 2 units of packed red blood cells.  He had endoscopy during the hospitalization which was unremarkable.  Since his last visit, he had small bowel capsule endoscopy, awaiting results.  He continues on oral iron thrice a day and has been tolerating it very well.  He tells me that his energy is significantly improved physical examination today without any concerns.  Last CBC about a week ago with improvement hemoglobin at 10.7 g/dL.  We will repeat labs in about 4 weeks when he returns for follow-up.  Rest of the pertinent 10 point ROS reviewed and negative  HISTORY OF PRESENTING ILLNESS:  Kevin Olson 76 y.o. male is here because of symptomatic anemia. Patient today arrived to the appointment with his wife.  He tells me he feels great, his energy is pretty much back to normal.  He is able to work in the garden.  He is taking iron thrice a day as recommended, denies any side effects.  No hematochezia or melena that he reports.  He had a capsule endoscopy and has a follow-up with a GI doctor in about 2 weeks. REVIEW OF SYSTEMS:   Constitutional: Denies fevers, chills or abnormal night sweats Eyes: Denies blurriness of vision, double vision or watery  eyes Ears, nose, mouth, throat, and face: Denies mucositis or sore throat Respiratory: Denies cough, dyspnea or wheezes Cardiovascular: Denies palpitation, chest discomfort or lower extremity swelling Gastrointestinal:  Denies nausea, heartburn or change in bowel habits Skin: Denies abnormal skin rashes Lymphatics: Denies new lymphadenopathy or easy bruising Neurological:Denies numbness, tingling or new weaknesses Behavioral/Psych: Mood is stable, no new changes  All other systems were reviewed with the patient and are negative.  MEDICAL HISTORY:  Past Medical History:  Diagnosis Date   Depression    DM2 (diabetes mellitus, type 2) (HCC)    Dyslipidemia    Hypertension    OA (osteoarthritis) of knee    Prostate cancer [facility])    prostate    SURGICAL HISTORY: Past Surgical History:  Procedure Laterality Date   BIOPSY  03/19/2022   Procedure: BIOPSY;  Surgeon: Clarene Essex, MD;  Location: [facility];  Service: Gastroenterology;;   ESOPHAGOGASTRODUODENOSCOPY (EGD) WITH PROPOFOL N/A 03/19/2022   Procedure: ESOPHAGOGASTRODUODENOSCOPY (EGD) WITH PROPOFOL;  Surgeon: Clarene Essex, MD;  Location: [facility];  Service: Gastroenterology;  Laterality: N/A;   INGUINAL HERNIA REPAIR     PROSTATE BIOPSY     TOTAL KNEE ARTHROPLASTY Left 10/26/2020   Procedure: TOTAL KNEE ARTHROPLASTY;  Surgeon: Paralee Cancel, MD;  Location: [facility];  Service: Orthopedics;  Laterality: Left;  70 mins   TOTAL KNEE ARTHROPLASTY Right 12/21/2020   Procedure: TOTAL KNEE ARTHROPLASTY;  Surgeon: Paralee Cancel, MD;  Location: [facility];  Service: Orthopedics;  Laterality: Right;  70 mins    SOCIAL HISTORY: Social History   Socioeconomic History  Marital status: Married    Spouse name: Not on file   Number of children: Not on file   Years of education: Not on file   Highest education level: Not on file  Occupational History   Occupation: retired    Comment: Architect  Tobacco Use   Smoking status: Former     Packs/day: 2.00    Years: 12.00    Total pack years: 24.00    Types: Cigarettes    Quit date: 04/19/1979    Years since quitting: 43.0   Smokeless tobacco: Never  Vaping Use   Vaping Use: Never used  Substance and Sexual Activity   Alcohol use: Yes    Comment: occas.   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  Other Topics Concern   Not on file  Social History Narrative   Not on file   Social Determinants of Health   Financial Resource Strain: Not on file  Food Insecurity: Not on file  Transportation Needs: Not on file  Physical Activity: Not on file  Stress: Not on file  Social Connections: Not on file  Intimate Partner Violence: Not on file    FAMILY HISTORY: Family History  Problem Relation Age of Onset   Pancreatic cancer Sister    Other Sister        brain tumor   CAD Mother    CAD Brother        CABGx4    CAD Brother        CABGx4   Colon cancer Neg Hx    Breast cancer Neg Hx     ALLERGIES:  has No Known Allergies.  MEDICATIONS:  Current Outpatient Medications  Medication Sig Dispense Refill   ALPRAZolam (XANAX) 1 MG tablet Take 1 mg by mouth at bedtime.     atorvastatin (LIPITOR) 10 MG tablet Take 10 mg by mouth every evening.     baclofen (LIORESAL) 10 MG tablet Take 10-20 mg by mouth 2 (two) times daily. Take 1 tablet (10 mg) in the morning and Take 2 tablets (20 mg) at bedtime     Blood Glucose Monitoring Suppl (TRUE METRIX AIR GLUCOSE METER) DEVI True Metrix Air Glucose Meter kit     Cholecalciferol (VITAMIN D3) 50 MCG (2000 UT) TABS Take 2,000 Units by mouth every morning.     Coenzyme Q10-Vitamin E (QUNOL ULTRA COQ10 PO) Take 1 capsule by mouth at bedtime.     DULoxetine (CYMBALTA) 60 MG capsule Take 60 mg by mouth every evening.     ferrous sulfate 325 (65 FE) MG tablet Take 325 mg by mouth 3 (three) times daily with meals.     glimepiride (AMARYL) 4 MG tablet Take 4 mg by mouth daily  with breakfast.     glucose blood (TRUE METRIX BLOOD GLUCOSE TEST) test strip True Metrix Glucose Test Strip     levothyroxine (SYNTHROID) 100 MCG tablet Take 100 mcg by mouth daily before breakfast.     losartan (COZAAR) 25 MG tablet Take 12.5 mg by mouth every evening.     metFORMIN (GLUCOPHAGE) 1000 MG tablet Take 1,000 mg by mouth in the morning and at bedtime.     Multiple Vitamins-Minerals (MULTIVITAMIN ADULT) CHEW Chew 6 tablets by mouth every evening. Fruits & Veggies     pantoprazole (PROTONIX) 40 MG tablet TAKE 1 TABLET BY MOUTH TWICE A DAY 60 tablet 0   pioglitazone (ACTOS) 15 MG tablet  Take 15 mg by mouth daily.     REPATHA SURECLICK 601 MG/ML SOAJ INJECT 140MG SUBCUTANEOUSLY EVERY 2 WEEKS (14 DAYS) AS DIRECTED 6 mL 3   tamsulosin (FLOMAX) 0.4 MG CAPS capsule Take 0.8 mg by mouth in the morning.     TRUEplus Lancets 33G MISC TRUEplus Lancets 33 gauge     No current facility-administered medications for this visit.     PHYSICAL EXAMINATION: ECOG PERFORMANCE STATUS: 0 - Asymptomatic  Vitals:   04/19/22 1138  BP: (!) 143/61  Pulse: (!) 108  Resp: 18  Temp: 97.7 F (36.5 C)  SpO2: 100%    Filed Weights   04/19/22 1138  Weight: 231 lb 6.4 oz (105 kg)     GENERAL:alert, no distress and comfortable SKIN: skin color, texture, turgor are normal, no rashes or significant lesions EYES: normal, conjunctiva are pink and non-injected, sclera clear OROPHARYNX:no exudate, no erythema and lips, buccal mucosa, and tongue normal  NECK: supple, thyroid normal size, non-tender, without nodularity LYMPH:  no palpable lymphadenopathy in the cervical, axillary or inguinal LUNGS: clear to auscultation and percussion with normal breathing effort HEART: regular rate & rhythm and no murmurs and no lower extremity edema ABDOMEN:abdomen soft, non-tender and normal bowel sounds Musculoskeletal:no cyanosis of digits and no clubbing  PSYCH: alert & oriented x 3 with fluent speech NEURO: no  focal motor/sensory deficits  LABORATORY DATA:  I have reviewed the data as listed Lab Results  Component Value Date   WBC 5.8 04/10/2022   HGB 10.7 (L) 04/10/2022   HCT 33.1 (L) 04/10/2022   MCV 84.7 04/10/2022   PLT 260 04/10/2022     Chemistry      Component Value Date/Time   NA 142 03/19/2022 0728   NA 143 12/11/2019 1543   K 3.8 03/19/2022 0728   CL 111 03/19/2022 0728   CO2 24 03/19/2022 0728   BUN 13 03/19/2022 0728   BUN 17 12/11/2019 1543   CREATININE 0.82 03/19/2022 0728      Component Value Date/Time   CALCIUM 9.2 03/19/2022 0728   ALKPHOS 62 03/18/2022 0820   AST 33 03/18/2022 0820   ALT 7 03/18/2022 0820   BILITOT 1.7 (H) 03/18/2022 0820     Labs from March 09, 2022 showed iron saturation of 3%, TIBC of 496, serum iron of 17 and transferrin levels of 355 consistent with iron deficiency.  His most recent hemoglobin showed 7.6 g/dL, normocytic normochromic, no evidence of leukopenia or thrombocytopenia.  No evidence of kidney impairment, hypercalcemia or elevated total protein, normal liver function tests, no evidence of hypothyroidism TSH is 2.97 Most recent labs reviewed.  Hemoglobin shows significant improvement RADIOGRAPHIC STUDIES: I have personally reviewed the radiological images as listed and agreed with the findings in the report. No results found.  All questions were answered. The patient knows to call the clinic with any problems, questions or concerns. I spent 20 minutes in the care of this patient including H and P, review of records, counseling and coordination of care.     Benay Pike, MD 04/19/2022 12:12 PM

## 2022-04-24 DIAGNOSIS — D509 Iron deficiency anemia, unspecified: Secondary | ICD-10-CM | POA: Diagnosis not present

## 2022-05-02 ENCOUNTER — Other Ambulatory Visit: Payer: Self-pay | Admitting: Genetic Counselor

## 2022-05-02 DIAGNOSIS — C61 Malignant neoplasm of prostate: Secondary | ICD-10-CM

## 2022-05-03 ENCOUNTER — Encounter: Payer: Self-pay | Admitting: Genetic Counselor

## 2022-05-03 ENCOUNTER — Other Ambulatory Visit: Payer: Self-pay

## 2022-05-03 ENCOUNTER — Inpatient Hospital Stay (HOSPITAL_BASED_OUTPATIENT_CLINIC_OR_DEPARTMENT_OTHER): Payer: Medicare HMO | Admitting: Genetic Counselor

## 2022-05-03 ENCOUNTER — Inpatient Hospital Stay: Payer: Medicare HMO

## 2022-05-03 DIAGNOSIS — C61 Malignant neoplasm of prostate: Secondary | ICD-10-CM | POA: Diagnosis not present

## 2022-05-03 DIAGNOSIS — Z800 Family history of malignant neoplasm of digestive organs: Secondary | ICD-10-CM | POA: Diagnosis not present

## 2022-05-03 DIAGNOSIS — Z808 Family history of malignant neoplasm of other organs or systems: Secondary | ICD-10-CM | POA: Diagnosis not present

## 2022-05-03 LAB — GENETIC SCREENING ORDER

## 2022-05-03 NOTE — Progress Notes (Signed)
REFERRING PROVIDER: Benay Pike, MD [place],  [place] [address]  PRIMARY PROVIDER:  Aurea Graff, PA-C (Inactive)  PRIMARY REASON FOR VISIT:  1. Family history of pancreatic cancer   2. Family history of brain cancer   3. Malignant neoplasm of prostate ([place])      HISTORY OF PRESENT ILLNESS:   Kevin Olson, a 76 y.o. male, was seen for a [place] cancer genetics consultation at the request of Dr. Chryl Heck due to a personal and family history of cancer.  Kevin Olson presents to clinic today to discuss the possibility of a hereditary predisposition to cancer, genetic testing, and to further clarify his future cancer risks, as well as potential cancer risks for family members.   In 2020, at the age of 39, Kevin Olson was diagnosed with prostate cancer.  The Gleason Score is 3x4=7.    CANCER HISTORY:  Oncology History  Malignant neoplasm of prostate ([place])  06/18/2019 Cancer Staging   Staging form: Prostate, AJCC 8th Edition - Clinical stage from 06/18/2019: Stage IIC (cT2a, cN0, cM0, PSA: 17.1, Grade Group: 3) - Signed by Freeman Caldron, PA-C on 12/02/2019   07/22/2019 Initial Diagnosis   Malignant neoplasm of prostate ([place])   09/29/2019 - 11/05/2019 Radiation Therapy   The prostate was treated to 70 Gy in 28 fractions of 2.5 Gy     Past Medical History:  Diagnosis Date   Depression    DM2 (diabetes mellitus, type 2) ([place])    Dyslipidemia    Family history of brain cancer    Family history of pancreatic cancer    Hypertension    OA (osteoarthritis) of knee    Prostate cancer [facility])    prostate    Past Surgical History:  Procedure Laterality Date   BIOPSY  03/19/2022   Procedure: BIOPSY;  Surgeon: Clarene Essex, MD;  Location: [facility];  Service: Gastroenterology;;   ESOPHAGOGASTRODUODENOSCOPY (EGD) WITH PROPOFOL N/A 03/19/2022   Procedure: ESOPHAGOGASTRODUODENOSCOPY (EGD) WITH PROPOFOL;  Surgeon: Clarene Essex, MD;  Location: [facility];  Service:  Gastroenterology;  Laterality: N/A;   INGUINAL HERNIA REPAIR     PROSTATE BIOPSY     TOTAL KNEE ARTHROPLASTY Left 10/26/2020   Procedure: TOTAL KNEE ARTHROPLASTY;  Surgeon: Paralee Cancel, MD;  Location: [facility];  Service: Orthopedics;  Laterality: Left;  70 mins   TOTAL KNEE ARTHROPLASTY Right 12/21/2020   Procedure: TOTAL KNEE ARTHROPLASTY;  Surgeon: Paralee Cancel, MD;  Location: [facility];  Service: Orthopedics;  Laterality: Right;  70 mins    Social History   Socioeconomic History   Marital status: Married    Spouse name: Not on file   Number of children: Not on file   Years of education: Not on file   Highest education level: Not on file  Occupational History   Occupation: retired    Comment: Architect  Tobacco Use   Smoking status: Former    Packs/day: 2.00    Years: 12.00    Total pack years: 24.00    Types: Cigarettes    Quit date: 04/19/1979    Years since quitting: 43.0   Smokeless tobacco: Never  Vaping Use   Vaping Use: Never used  Substance and Sexual Activity   Alcohol use: Yes    Comment: occas.   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  Significant diagnoses are listed below: Family History  Problem Relation Age of Onset   CAD Mother    Pancreatic cancer Sister 6   Other Sister        brain tumor   Leukemia Sister    CAD Brother        CABGx4    CAD Brother        CABGx4   Cancer Maternal Aunt        unknown   Cancer Maternal Uncle        unknown   Lung cancer Maternal Grandfather    Lung cancer Paternal  Grandfather    Spina bifida Niece    Cancer Cousin        unknown   Colon cancer Neg Hx    Breast cancer Neg Hx       The patient has two daughters who are cancer free.  He has four brothers, four sisters and one maternal half sister.  One brother has lung cancer, one sister has CLL and his maternal half sister had brain cancer in her 66's and pancreatic cancer in her 61's.  The patient's parents are deceased.  The patient's mother is deceased.  She had a sister and brother with unknown cancers and a sister who had a daughter with an unknown cancer.  The maternal grandfather had lung cancer.  The patient's father died at 31.  There is no reported family history of cancer on the paternal side.  Kevin Olson is unaware of previous family history of genetic testing for hereditary cancer risks. Patient's maternal ancestors are of [place], [place] and [place] descent, and paternal ancestors are of [place] descent. There is no reported Ashkenazi Jewish ancestry. There is no known consanguinity.  GENETIC COUNSELING ASSESSMENT: Kevin Olson is a 76 y.o. male with a personal and family history of cancer which is somewhat suggestive of a hereditary cancer syndrome and predisposition to cancer given the combination cancer. We, therefore, discussed and recommended the following at today's visit.   DISCUSSION: We discussed that, in general, most cancer is not inherited in families, but instead is sporadic or familial. Sporadic cancers occur by chance and typically happen at older ages (>50 years) as this type of cancer is caused by genetic changes acquired during an individual's lifetime. Some families have more cancers than would be expected by chance; however, the ages or types of cancer are not consistent with a known genetic mutation or known genetic mutations have been ruled out. This type of familial cancer is thought to be due to a combination of multiple genetic, environmental, hormonal, and lifestyle  factors. While this combination of factors likely increases the risk of cancer, the exact source of this risk is not currently identifiable or testable.  We discussed that up to 15% of prostate cancer is hereditary, with most cases associated with BRCA mutations.  There are other genes that can be associated with hereditary prostate cancer syndromes.  These include HOXB13, CHEK2, PALB2 and others.  We discussed that testing is beneficial for several reasons including knowing how to follow individuals after completing their treatment, identifying whether potential treatment options such as  PARP inhibitors would be beneficial, and understand if other family members could be at risk for cancer and allow them to undergo genetic testing.   We reviewed the characteristics, features and inheritance patterns of hereditary cancer syndromes. We also discussed genetic testing, including the appropriate family members to test, the process of testing, insurance coverage and turn-around-time for results. We discussed the implications of a negative, positive, carrier and/or variant of uncertain significant result. We recommended Kevin Olson pursue genetic testing for the CancerNext-Expanded+RNAinsight gene panel.   The CancerNext-Expanded gene panel offered by [facility] and includes sequencing and rearrangement analysis for the following 77 genes: AIP, ALK, APC*, ATM*, AXIN2, BAP1, BARD1, BLM, BMPR1A, BRCA1*, BRCA2*, BRIP1*, CDC73, CDH1*, CDK4, CDKN1B, CDKN2A, CHEK2*, CTNNA1, DICER1, FANCC, FH, FLCN, GALNT12, KIF1B, LZTR1, MAX, MEN1, MET, MLH1*, MSH2*, MSH3, MSH6*, MUTYH*, NBN, NF1*, NF2, NTHL1, PALB2*, PHOX2B, PMS2*, POT1, PRKAR1A, PTCH1, PTEN*, RAD51C*, RAD51D*, RB1, RECQL, RET, SDHA, SDHAF2, SDHB, SDHC, SDHD, SMAD4, SMARCA4, SMARCB1, SMARCE1, STK11, SUFU, TMEM127, TP53*, TSC1, TSC2, VHL and XRCC2 (sequencing and deletion/duplication); EGFR, EGLN1, HOXB13, KIT, MITF, PDGFRA, POLD1, and POLE (sequencing only); EPCAM and  GREM1 (deletion/duplication only). DNA and RNA analyses performed for * genes.   Based on Kevin Olson's personal and family history of cancer, he meets medical criteria for genetic testing. Despite that he meets criteria, he may still have an out of pocket cost. We discussed that if his out of pocket cost for testing is over $100, the laboratory will call and confirm whether he wants to proceed with testing.  If the out of pocket cost of testing is less than $100 he will be billed by the genetic testing laboratory.   PLAN: After considering the risks, benefits, and limitations, Kevin Olson provided informed consent to pursue genetic testing and the blood sample was sent to [facility] for analysis of the CancerNext-Expanded+RNAinsight. Results should be available within approximately 2-3 weeks' time, at which point they will be disclosed by telephone to Kevin Olson, as will any additional recommendations warranted by these results. Kevin Olson will receive a summary of his genetic counseling visit and a copy of his results once available. This information will also be available in Epic.   Lastly, we encouraged Kevin Olson to remain in contact with cancer genetics annually so that we can continuously update the family history and inform him of any changes in cancer genetics and testing that may be of benefit for this family.   Kevin Olson questions were answered to his satisfaction today. Our contact information was provided should additional questions or concerns arise. Thank you for the referral and allowing [place] to share in the care of your patient.   Roda Olson P. Florene Glen, [place], [facility] Licensed, Insurance risk surveyor [email or web address] phone: [phone]  The patient was seen for a total of 35 minutes in face-to-face genetic counseling.  The patient was seen alone.  This patient was discussed with Drs. Magrinat, Lindi Adie and/or Burr Medico who agrees with the above.     _______________________________________________________________________ For Office Staff:  Number of people involved in session: 1 Was an Intern/ student involved with case: no

## 2022-05-04 DIAGNOSIS — E1142 Type 2 diabetes mellitus with diabetic polyneuropathy: Secondary | ICD-10-CM | POA: Diagnosis not present

## 2022-05-04 DIAGNOSIS — I10 Essential (primary) hypertension: Secondary | ICD-10-CM | POA: Diagnosis not present

## 2022-05-04 DIAGNOSIS — E782 Mixed hyperlipidemia: Secondary | ICD-10-CM | POA: Diagnosis not present

## 2022-05-08 ENCOUNTER — Encounter: Payer: Self-pay | Admitting: Genetic Counselor

## 2022-05-16 ENCOUNTER — Ambulatory Visit: Payer: Self-pay | Admitting: Genetic Counselor

## 2022-05-16 ENCOUNTER — Telehealth: Payer: Self-pay | Admitting: Genetic Counselor

## 2022-05-16 ENCOUNTER — Encounter: Payer: Self-pay | Admitting: Genetic Counselor

## 2022-05-16 DIAGNOSIS — Z1379 Encounter for other screening for genetic and chromosomal anomalies: Secondary | ICD-10-CM | POA: Insufficient documentation

## 2022-05-16 NOTE — Progress Notes (Signed)
HPI:  Mr. Abair was previously seen in the [facility] clinic due to a personal and family history of cancer and concerns regarding a hereditary predisposition to cancer. Please refer to our prior cancer genetics clinic note for more information regarding our discussion, assessment and recommendations, at the time. Mr. Boeckman recent genetic test results were disclosed to him, as were recommendations warranted by these results. These results and recommendations are discussed in more detail below.  CANCER HISTORY:  Oncology History  Malignant neoplasm of prostate ([place])  06/18/2019 Cancer Staging   Staging form: Prostate, AJCC 8th Edition - Clinical stage from 06/18/2019: Stage IIC (cT2a, cN0, cM0, PSA: 17.1, Grade Group: 3) - Signed by Freeman Caldron, PA-C on 12/02/2019   07/22/2019 Initial Diagnosis   Malignant neoplasm of prostate ([place])   09/29/2019 - 11/05/2019 Radiation Therapy   The prostate was treated to 70 Gy in 28 fractions of 2.5 Gy   05/16/2022 Genetic Testing   Negative genetic testing on the CancerNext-Expanded+RNAinsight panel.  The report date is May 16, 2022  The CancerNext-Expanded gene panel offered by [facility] and includes sequencing and rearrangement analysis for the following 77 genes: AIP, ALK, APC*, ATM*, AXIN2, BAP1, BARD1, BLM, BMPR1A, BRCA1*, BRCA2*, BRIP1*, CDC73, CDH1*, CDK4, CDKN1B, CDKN2A, CHEK2*, CTNNA1, DICER1, FANCC, FH, FLCN, GALNT12, KIF1B, LZTR1, MAX, MEN1, MET, MLH1*, MSH2*, MSH3, MSH6*, MUTYH*, NBN, NF1*, NF2, NTHL1, PALB2*, PHOX2B, PMS2*, POT1, PRKAR1A, PTCH1, PTEN*, RAD51C*, RAD51D*, RB1, RECQL, RET, SDHA, SDHAF2, SDHB, SDHC, SDHD, SMAD4, SMARCA4, SMARCB1, SMARCE1, STK11, SUFU, TMEM127, TP53*, TSC1, TSC2, VHL and XRCC2 (sequencing and deletion/duplication); EGFR, EGLN1, HOXB13, KIT, MITF, PDGFRA, POLD1, and POLE (sequencing only); EPCAM and GREM1 (deletion/duplication only). DNA and RNA analyses performed for * genes.      FAMILY  HISTORY:  We obtained a detailed, 4-generation family history.  Significant diagnoses are listed below: Family History  Problem Relation Age of Onset   CAD Mother    Pancreatic cancer Sister 58   Other Sister        brain tumor   Leukemia Sister    Celiac disease Sister    CAD Brother        CABGx4    CAD Brother        CABGx4   Lung cancer Maternal Grandfather    Lung cancer Paternal Grandfather    Cancer Maternal Aunt        unknown   Celiac disease Maternal Aunt    Cancer Maternal Uncle        unknown   Cancer Cousin        unknown   Spina bifida Niece    Colon cancer Neg Hx    Breast cancer Neg Hx        The patient has two daughters who are cancer free.  He has four brothers, four sisters and one maternal half sister.  One brother has lung cancer, one sister has CLL and his maternal half sister had brain cancer in her 63's and pancreatic cancer in her 44's.  The patient's parents are deceased.   The patient's mother is deceased.  She had a sister and brother with unknown cancers and a sister who had a daughter with an unknown cancer.  The maternal grandfather had lung cancer.   The patient's father died at 87.  There is no reported family history of cancer on the paternal side.   Mr. Frasier is unaware of previous family history of genetic testing for hereditary cancer risks. Patient's  maternal ancestors are of [place], [place] and [place] descent, and paternal ancestors are of [place] descent. There is no reported Ashkenazi Jewish ancestry. There is no known consanguinity.  GENETIC TEST RESULTS: Genetic testing reported out on May 16, 2022 through the CancerNext-Expanded+RNAinsight cancer panel found no pathogenic mutations. The CancerNext-Expanded gene panel offered by [facility] and includes sequencing and rearrangement analysis for the following 77 genes: AIP, ALK, APC*, ATM*, AXIN2, BAP1, BARD1, BLM, BMPR1A, BRCA1*, BRCA2*, BRIP1*, CDC73, CDH1*, CDK4, CDKN1B,  CDKN2A, CHEK2*, CTNNA1, DICER1, FANCC, FH, FLCN, GALNT12, KIF1B, LZTR1, MAX, MEN1, MET, MLH1*, MSH2*, MSH3, MSH6*, MUTYH*, NBN, NF1*, NF2, NTHL1, PALB2*, PHOX2B, PMS2*, POT1, PRKAR1A, PTCH1, PTEN*, RAD51C*, RAD51D*, RB1, RECQL, RET, SDHA, SDHAF2, SDHB, SDHC, SDHD, SMAD4, SMARCA4, SMARCB1, SMARCE1, STK11, SUFU, TMEM127, TP53*, TSC1, TSC2, VHL and XRCC2 (sequencing and deletion/duplication); EGFR, EGLN1, HOXB13, KIT, MITF, PDGFRA, POLD1, and POLE (sequencing only); EPCAM and GREM1 (deletion/duplication only). DNA and RNA analyses performed for * genes. The test report has been scanned into EPIC and is located under the Molecular Pathology section of the Results Review tab.  A portion of the result report is included below for reference.     We discussed with Mr. Bello that because current genetic testing is not perfect, it is possible there may be a gene mutation in one of these genes that current testing cannot detect, but that chance is small.  We also discussed, that there could be another gene that has not yet been discovered, or that we have not yet tested, that is responsible for the cancer diagnoses in the family. It is also possible there is a hereditary cause for the cancer in the family that Mr. Tuckerman did not inherit and therefore was not identified in his testing.  Therefore, it is important to remain in touch with cancer genetics in the future so that we can continue to offer Mr. Pappalardo the most up to date genetic testing.   ADDITIONAL GENETIC TESTING: We discussed with Mr. Hazelrigg that his genetic testing was fairly extensive.  If there are genes identified to increase cancer risk that can be analyzed in the future, we would be happy to discuss and coordinate this testing at that time.    CANCER SCREENING RECOMMENDATIONS: Mr. Hermann test result is considered negative (normal).  This means that we have not identified a hereditary cause for his personal and family history of cancer at this time.  Most cancers happen by chance and this negative test suggests that his cancer may fall into this category.    While reassuring, this does not definitively rule out a hereditary predisposition to cancer. It is still possible that there could be genetic mutations that are undetectable by current technology. There could be genetic mutations in genes that have not been tested or identified to increase cancer risk.  Therefore, it is recommended he continue to follow the cancer management and screening guidelines provided by his oncology and primary healthcare provider.   An individual's cancer risk and medical management are not determined by genetic test results alone. Overall cancer risk assessment incorporates additional factors, including personal medical history, family history, and any available genetic information that may result in a personalized plan for cancer prevention and surveillance  RECOMMENDATIONS FOR FAMILY MEMBERS:  Individuals in this family might be at some increased risk of developing cancer, over the general population risk, simply due to the family history of cancer.  We recommended women in this family have a yearly mammogram beginning at age 23,  or 10 years younger than the earliest onset of cancer, an annual clinical breast exam, and perform monthly breast self-exams. Women in this family should also have a gynecological exam as recommended by their primary provider. All family members should be referred for colonoscopy starting at age 45.  FOLLOW-UP: Lastly, we discussed with Mr. Oshita that cancer genetics is a rapidly advancing field and it is possible that new genetic tests will be appropriate for him and/or his family members in the future. We encouraged him to remain in contact with cancer genetics on an annual basis so we can update his personal and family histories and let him know of advances in cancer genetics that may benefit this family.   Our contact number was provided.  Mr. Balz's questions were answered to his satisfaction, and he knows he is welcome to call us at anytime with additional questions or concerns.   Karen Powell, MS, LCGC Licensed, Certified Genetic Counselor [email or web address]  

## 2022-05-16 NOTE — Telephone Encounter (Signed)
Revealed negative genetic testing.  Discussed that we do not know why he has prostate cancer or why there is cancer in the family. It could be due to a different gene that we are not testing, or maybe our current technology may not be able to pick something up.  It will be important for him to keep in contact with genetics to keep up with whether additional testing may be needed.   

## 2022-05-17 ENCOUNTER — Inpatient Hospital Stay: Payer: Medicare HMO

## 2022-05-17 ENCOUNTER — Inpatient Hospital Stay: Payer: Medicare HMO | Admitting: Hematology and Oncology

## 2022-05-18 ENCOUNTER — Other Ambulatory Visit: Payer: Self-pay

## 2022-05-18 ENCOUNTER — Encounter: Payer: Self-pay | Admitting: Adult Health

## 2022-05-18 ENCOUNTER — Inpatient Hospital Stay: Payer: Medicare HMO

## 2022-05-18 ENCOUNTER — Inpatient Hospital Stay: Payer: Medicare HMO | Admitting: Adult Health

## 2022-05-18 VITALS — BP 138/57 | HR 68 | Temp 97.4°F | Resp 18 | Ht 70.0 in | Wt 228.4 lb

## 2022-05-18 DIAGNOSIS — E039 Hypothyroidism, unspecified: Secondary | ICD-10-CM | POA: Diagnosis not present

## 2022-05-18 DIAGNOSIS — I10 Essential (primary) hypertension: Secondary | ICD-10-CM | POA: Diagnosis not present

## 2022-05-18 DIAGNOSIS — Z79899 Other long term (current) drug therapy: Secondary | ICD-10-CM | POA: Diagnosis not present

## 2022-05-18 DIAGNOSIS — E119 Type 2 diabetes mellitus without complications: Secondary | ICD-10-CM | POA: Diagnosis not present

## 2022-05-18 DIAGNOSIS — Z7984 Long term (current) use of oral hypoglycemic drugs: Secondary | ICD-10-CM | POA: Diagnosis not present

## 2022-05-18 DIAGNOSIS — D500 Iron deficiency anemia secondary to blood loss (chronic): Secondary | ICD-10-CM

## 2022-05-18 DIAGNOSIS — Z8546 Personal history of malignant neoplasm of prostate: Secondary | ICD-10-CM | POA: Diagnosis not present

## 2022-05-18 DIAGNOSIS — E1140 Type 2 diabetes mellitus with diabetic neuropathy, unspecified: Secondary | ICD-10-CM | POA: Diagnosis not present

## 2022-05-18 DIAGNOSIS — N400 Enlarged prostate without lower urinary tract symptoms: Secondary | ICD-10-CM | POA: Diagnosis not present

## 2022-05-18 DIAGNOSIS — Z87891 Personal history of nicotine dependence: Secondary | ICD-10-CM | POA: Diagnosis not present

## 2022-05-18 DIAGNOSIS — E782 Mixed hyperlipidemia: Secondary | ICD-10-CM | POA: Diagnosis not present

## 2022-05-18 DIAGNOSIS — D509 Iron deficiency anemia, unspecified: Secondary | ICD-10-CM | POA: Diagnosis not present

## 2022-05-18 LAB — CBC WITH DIFFERENTIAL/PLATELET
Abs Immature Granulocytes: 0.01 10*3/uL (ref 0.00–0.07)
Basophils Absolute: 0.1 10*3/uL (ref 0.0–0.1)
Basophils Relative: 1 %
Eosinophils Absolute: 0.2 10*3/uL (ref 0.0–0.5)
Eosinophils Relative: 3 %
HCT: 36.4 % — ABNORMAL LOW (ref 39.0–52.0)
Hemoglobin: 12.1 g/dL — ABNORMAL LOW (ref 13.0–17.0)
Immature Granulocytes: 0 %
Lymphocytes Relative: 24 %
Lymphs Abs: 1.2 10*3/uL (ref 0.7–4.0)
MCH: 29.1 pg (ref 26.0–34.0)
MCHC: 33.2 g/dL (ref 30.0–36.0)
MCV: 87.5 fL (ref 80.0–100.0)
Monocytes Absolute: 0.4 10*3/uL (ref 0.1–1.0)
Monocytes Relative: 8 %
Neutro Abs: 3.2 10*3/uL (ref 1.7–7.7)
Neutrophils Relative %: 64 %
Platelets: 250 10*3/uL (ref 150–400)
RBC: 4.16 MIL/uL — ABNORMAL LOW (ref 4.22–5.81)
RDW: 20.7 % — ABNORMAL HIGH (ref 11.5–15.5)
WBC: 5 10*3/uL (ref 4.0–10.5)
nRBC: 0 % (ref 0.0–0.2)

## 2022-05-18 NOTE — Progress Notes (Signed)
[facility] Follow up:    Kevin Olson (Inactive) [address] [place] [address]   DIAGNOSIS: Anemia  SUMMARY OF HEMATOLOGIC HISTORY:  March 09, 2022 (scanned in media tab) showed iron saturation of 3%, TIBC of 496, serum iron of 17 and transferrin levels of 355 consistent with iron deficiency.  Hemoglobin was 7.6 g/dL, normocytic normochromic, no evidence of leukopenia or thrombocytopenia.  No evidence of kidney impairment, hypercalcemia or elevated   03/17/2022-03/19/2022 Hospital Admission: Hemoglobin was 7, he received 2 units of packed red blood cells and the GI team was consulted.  An upper endoscopy was completed on July 2 that found a small hiatal hernia, esophageal mucosal changes consistent with short segment Barrett's esophagus present in the lower third of the esophagus, few nonbleeding linear and superficial gastric ulcers with no stigmata of bleeding in the gastric body, the duodenal bulb, first portion of the duodenum, second portion of the duodenum and third portion of the duodenum were all normal.  Biopsies obtained were negative for malignancy or H. pylori.   CURRENT THERAPY: Oral iron 3 times a day  INTERVAL HISTORY: Kevin Olson 76 y.o. male returns for follow-up of his iron deficiency anemia.  He continues on oral iron 3 times a day and tolerates this well.  He denies any constipation or nausea related to the oral iron.  His hemoglobin today is 12.  He tells [place] that he underwent the capsule endoscopy with Dr. Watt Climes a few weeks ago and those results showed here healed areas of ulceration with no current bleeding.  Overall he is doing well today and wants to make sure he does not have celiac disease or lupus.   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   Essential tremor 03/18/2022   Hypothyroidism 03/18/2022   Mixed hyperlipidemia 03/18/2022   Personal history of colonic polyps 03/18/2022   Polyneuropathy 03/18/2022   Osteoarthritis 03/18/2022   Diabetes mellitus type 2, noninsulin dependent ([place]) 03/18/2022   Morbid obesity ([place]) 03/18/2022   Symptomatic anemia 03/17/2022   S/P total knee arthroplasty, right 12/21/2020   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/26/2020   Malignant neoplasm of prostate ([place]) 07/22/2019   Osteoarthritis of left knee 10/02/2018   Osteoarthritis of right knee 10/02/2018   Bilateral knee pain 09/03/2018   Low back pain 12/17/2017   Spinal stenosis of lumbar region 12/17/2017    has No Known Allergies.  MEDICAL HISTORY: Past Medical History:  Diagnosis Date   Depression    DM2 (diabetes mellitus, type 2) ([place])    Dyslipidemia    Family history of brain cancer    Family history of pancreatic cancer    Hypertension    OA (osteoarthritis) of knee    Prostate cancer [facility])    prostate    SURGICAL HISTORY: Past Surgical History:  Procedure Laterality Date   BIOPSY  03/19/2022   Procedure: BIOPSY;  Surgeon: Clarene Essex, MD;  Location: [facility];  Service: Gastroenterology;;   ESOPHAGOGASTRODUODENOSCOPY (EGD) WITH PROPOFOL N/A 03/19/2022   Procedure: ESOPHAGOGASTRODUODENOSCOPY (EGD) WITH PROPOFOL;  Surgeon: Clarene Essex, MD;  Location: [facility];  Service: Gastroenterology;  Laterality: N/A;   INGUINAL HERNIA REPAIR  PROSTATE BIOPSY     TOTAL KNEE ARTHROPLASTY Left 10/26/2020   Procedure: TOTAL KNEE ARTHROPLASTY;  Surgeon: Paralee Cancel, MD;  Location: [facility];  Service: Orthopedics;  Laterality: Left;  70 mins   TOTAL KNEE ARTHROPLASTY Right 12/21/2020   Procedure: TOTAL KNEE ARTHROPLASTY;  Surgeon: Paralee Cancel, MD;  Location: [facility];  Service: Orthopedics;  Laterality: Right;   70 mins    SOCIAL HISTORY: Social History   Socioeconomic History   Marital status: Married    Spouse name: Not on file   Number of children: Not on file   Years of education: Not on file   Highest education level: Not on file  Occupational History   Occupation: retired    Comment: Architect  Tobacco Use   Smoking status: Former    Packs/day: 2.00    Years: 12.00    Total pack years: 24.00    Types: Cigarettes    Quit date: 04/19/1979    Years since quitting: 43.1   Smokeless tobacco: Never  Vaping Use   Vaping Use: Never used  Substance and Sexual Activity   Alcohol use: Yes    Comment: occas.   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  Other Topics Concern   Not on file  Social History Narrative   Not on file   Social Determinants of Health   Financial Resource Strain: Not on file  Food Insecurity: Not on file  Transportation Needs: Not on file  Physical Activity: Not on file  Stress: Not on file  Social Connections: Not on file  Intimate Partner Violence: Not on file    FAMILY HISTORY: Family History  Problem Relation Age of Onset   CAD Mother    Pancreatic cancer Sister 51   Other Sister        brain tumor   Leukemia Sister    Celiac disease Sister    CAD Brother        CABGx4    CAD Brother        CABGx4   Lung cancer Maternal Grandfather    Lung cancer Paternal Grandfather    Cancer Maternal Aunt        unknown   Celiac disease Maternal Aunt    Cancer Maternal Uncle        unknown   Cancer Cousin        unknown   Spina bifida Niece    Colon cancer Neg Hx    Breast cancer Neg Hx     Review of Systems  Constitutional:  Negative for appetite change, chills, fatigue, fever and unexpected weight change.  HENT:   Negative for hearing loss, lump/mass and trouble swallowing.   Eyes:  Negative for eye problems and icterus.  Respiratory:  Negative for chest tightness, cough and  shortness of breath.   Cardiovascular:  Negative for chest pain, leg swelling and palpitations.  Gastrointestinal:  Negative for abdominal distention, abdominal pain, blood in stool, constipation, diarrhea, nausea, rectal pain and vomiting.       No hematochezia or melena noted.  Endocrine: Negative for hot flashes.  Genitourinary:  Negative for difficulty urinating.   Musculoskeletal:  Negative for arthralgias.  Skin:  Negative for itching and rash.  Neurological:  Negative for dizziness, extremity weakness, headaches and numbness.  Hematological:  Negative for adenopathy. Does not bruise/bleed easily.  Psychiatric/Behavioral:  Negative for depression. The patient is not nervous/anxious.  PHYSICAL EXAMINATION  ECOG PERFORMANCE STATUS: 0 - Asymptomatic  Vitals:   05/18/22 1014  BP: (!) 138/57  Pulse: 68  Resp: 18  Temp: (!) 97.4 F (36.3 C)  SpO2: 98%    Physical Exam Constitutional:      General: He is not in acute distress.    Appearance: Normal appearance. He is not toxic-appearing.  HENT:     Head: Normocephalic and atraumatic.  Eyes:     General: No scleral icterus. Cardiovascular:     Rate and Rhythm: Normal rate and regular rhythm.     Pulses: Normal pulses.     Heart sounds: Normal heart sounds.  Pulmonary:     Effort: Pulmonary effort is normal.     Breath sounds: Normal breath sounds.  Abdominal:     General: Abdomen is flat. Bowel sounds are normal. There is no distension.     Palpations: Abdomen is soft.     Tenderness: There is no abdominal tenderness.  Musculoskeletal:        General: No swelling.     Cervical back: Neck supple.  Lymphadenopathy:     Cervical: No cervical adenopathy.  Skin:    General: Skin is warm and dry.     Findings: No rash.  Neurological:     General: No focal deficit present.     Mental Status: He is alert.  Psychiatric:        Mood and Affect: Mood normal.        Behavior: Behavior normal.     LABORATORY  DATA:  CBC    Component Value Date/Time   WBC 5.0 05/18/2022 0944   RBC 4.16 (L) 05/18/2022 0944   HGB 12.1 (L) 05/18/2022 0944   HCT 36.4 (L) 05/18/2022 0944   PLT 250 05/18/2022 0944   MCV 87.5 05/18/2022 0944   MCH 29.1 05/18/2022 0944   MCHC 33.2 05/18/2022 0944   RDW 20.7 (H) 05/18/2022 0944   LYMPHSABS 1.2 05/18/2022 0944   MONOABS 0.4 05/18/2022 0944   EOSABS 0.2 05/18/2022 0944   BASOSABS 0.1 05/18/2022 0944     ASSESSMENT and THERAPY PLAN:   IDA (iron deficiency anemia) Kevin Olson is a 76 year old man with history of iron deficiency anemia currently on oral iron replacement with his most recent hemoglobin being 12.1.  His capsule endoscopy confirms that these are old areas of bleeding and have stopped since he discontinued the Celebrex and began Protonix.  He will continue his follow-up with Dr. May God.  I recommended for him to continue the oral iron 2-3 times a day.  I reviewed with him that celiac disease was likely evaluated by Dr. May God and in this situation it was clearly previous ulcerations that improved once the offending medication was stopped.  We also discussed lupus and those symptoms and I recommended that should he develop symptoms of lupus that he reach out to his primary care to undergo further testing.  He will return in 6 months for labs and follow-up with Dr. Chryl Heck.    All questions were answered. The patient knows to call the clinic with any problems, questions or concerns. We can certainly see the patient much sooner if necessary.  Total encounter time:20 minutes*in face-to-face visit time, chart review, lab review, care coordination, order entry, and documentation of the encounter time.    Wilber Bihari, NP 05/18/22 11:11 AM Medical Oncology and Hematology [facility] [place],  [address] Tel. [phone]    Fax. [phone]  *  Total Encounter Time as defined by the Centers for Medicare and Medicaid  Services includes, in addition to the face-to-face time of a patient visit (documented in the note above) non-face-to-face time: obtaining and reviewing outside history, ordering and reviewing medications, tests or procedures, care coordination (communications with other health care professionals or caregivers) and documentation in the medical record.

## 2022-05-18 NOTE — Assessment & Plan Note (Signed)
Ron is a 76 year old man with history of iron deficiency anemia currently on oral iron replacement with his most recent hemoglobin being 12.1.  His capsule endoscopy confirms that these are old areas of bleeding and have stopped since he discontinued the Celebrex and began Protonix.  He will continue his follow-up with Dr. May God.  I recommended for him to continue the oral iron 2-3 times a day.  I reviewed with him that celiac disease was likely evaluated by Dr. May God and in this situation it was clearly previous ulcerations that improved once the offending medication was stopped.  We also discussed lupus and those symptoms and I recommended that should he develop symptoms of lupus that he reach out to his primary care to undergo further testing.  He will return in 6 months for labs and follow-up with Dr. Chryl Heck.

## 2022-05-24 DIAGNOSIS — D508 Other iron deficiency anemias: Secondary | ICD-10-CM | POA: Diagnosis not present

## 2022-06-07 ENCOUNTER — Ambulatory Visit: Payer: Medicare HMO | Attending: Nurse Practitioner | Admitting: Nurse Practitioner

## 2022-06-07 ENCOUNTER — Encounter: Payer: Self-pay | Admitting: Nurse Practitioner

## 2022-06-07 VITALS — BP 134/72 | HR 64 | Ht 69.0 in | Wt 232.2 lb

## 2022-06-07 DIAGNOSIS — I2510 Atherosclerotic heart disease of native coronary artery without angina pectoris: Secondary | ICD-10-CM

## 2022-06-07 DIAGNOSIS — E785 Hyperlipidemia, unspecified: Secondary | ICD-10-CM | POA: Diagnosis not present

## 2022-06-07 DIAGNOSIS — R0602 Shortness of breath: Secondary | ICD-10-CM

## 2022-06-07 DIAGNOSIS — I10 Essential (primary) hypertension: Secondary | ICD-10-CM

## 2022-06-07 DIAGNOSIS — D500 Iron deficiency anemia secondary to blood loss (chronic): Secondary | ICD-10-CM | POA: Diagnosis not present

## 2022-06-07 NOTE — Progress Notes (Signed)
Office Visit    Patient Name: Kevin Olson Date of Encounter: 06/07/2022  Primary Care Provider:  Garth Bigness (Inactive) Primary Cardiologist:  Pixie Casino, MD  Chief Complaint    76 year old male with a history of CAD hypertension, hyperlipidemia, type 2 diabetes, osteoarthritis, IDA, depression, and prostate cancer who presents for follow-up related to CAD and hyperlipidemia.  Past Medical History    Past Medical History:  Diagnosis Date   Depression    DM2 (diabetes mellitus, type 2) ([place])    Dyslipidemia    Family history of brain cancer    Family history of pancreatic cancer    Hypertension    OA (osteoarthritis) of knee    Prostate cancer [facility])    prostate   Past Surgical History:  Procedure Laterality Date   BIOPSY  03/19/2022   Procedure: BIOPSY;  Surgeon: Clarene Essex, MD;  [facility];  Service: Gastroenterology;;   ESOPHAGOGASTRODUODENOSCOPY (EGD) WITH PROPOFOL N/A 03/19/2022   Procedure: ESOPHAGOGASTRODUODENOSCOPY (EGD) WITH PROPOFOL;  Surgeon: Clarene Essex, MD;  [facility];  Service: Gastroenterology;  Laterality: N/A;   INGUINAL HERNIA REPAIR     PROSTATE BIOPSY     TOTAL KNEE ARTHROPLASTY Left 10/26/2020   Procedure: TOTAL KNEE ARTHROPLASTY;  Surgeon: Paralee Cancel, MD;  Location: [facility];  Service: Orthopedics;  Laterality: Left;  70 mins   TOTAL KNEE ARTHROPLASTY Right 12/21/2020   Procedure: TOTAL KNEE ARTHROPLASTY;  Surgeon: Paralee Cancel, MD;  Location: [facility];  Service: Orthopedics;  Laterality: Right;  70 mins    Allergies  No Known Allergies  History of Present Illness    76 year old male with the above past medical history including CAD, hypertension, hyperlipidemia, type 2 diabetes, osteoarthritis, IDA, depression, and prostate cancer,  He was initially referred to Dr. Debara Pickett by his PCP in 2021 for the evaluation of dyslipidemia.  Coronary CTA in 11/2019 showed coronary calcium score of 250 (56 percentile), plaque  in the LAD, unable to perform FFR, otherwise moderate nonobstructive CAD.  Lexiscan Myoview in 12/2019 showed no evidence of ischemia, EF 62%.  He was started on Repatha for management of dyslipidemia. He was last seen in the office on 10/25/2021 and was stable from a cardiac standpoint. He did note a mild increase in shortness of breath she attributed to weight gain, otherwise, he denied any chest pain.  He was hospitalized at the end of June 2023 in the setting of anemia, possible GI bleed. GI was consulted and it was thought that his bleeding was likely due to previous ulcerations that improved once the offending medication (Celebrex) was discontinued.  He is following with hematology/oncology.  He presents today for follow-up.  Since his last visit he has done well from a cardiac standpoint.  He denies any chest pain, dyspnea.  He is concerned about the cost of Repatha and his ability to continue this medication long-term.  He is asking about possible alternative therapies.  Otherwise, he reports feeling well denies any additional concerns today.  Home Medications    Current Outpatient Medications  Medication Sig Dispense Refill   ALPRAZolam (XANAX) 1 MG tablet Take 1 mg by mouth at bedtime.     atorvastatin (LIPITOR) 10 MG tablet Take 10 mg by mouth every evening.     baclofen (LIORESAL) 10 MG tablet Take 10-20 mg by mouth 2 (two) times daily. Take 1 tablet (10 mg) in the morning and Take 2 tablets (20 mg) at bedtime     Blood Glucose  Monitoring Suppl (TRUE METRIX AIR GLUCOSE METER) DEVI True Metrix Air Glucose Meter kit     Cholecalciferol (VITAMIN D3) 50 MCG (2000 UT) TABS Take 2,000 Units by mouth every morning.     Coenzyme Q10-Vitamin E (QUNOL ULTRA COQ10 PO) Take 1 capsule by mouth at bedtime.     DULoxetine (CYMBALTA) 60 MG capsule Take 60 mg by mouth every evening.     ferrous sulfate 325 (65 FE) MG tablet Take 325 mg by mouth 3 (three) times daily with meals.     glimepiride (AMARYL) 4 MG  tablet Take 4 mg by mouth daily with breakfast.     glucose blood (TRUE METRIX BLOOD GLUCOSE TEST) test strip True Metrix Glucose Test Strip     levothyroxine (SYNTHROID) 100 MCG tablet Take 100 mcg by mouth daily before breakfast.     losartan (COZAAR) 25 MG tablet Take 12.5 mg by mouth every evening.     metFORMIN (GLUCOPHAGE) 1000 MG tablet Take 1,000 mg by mouth in the morning and at bedtime.     pantoprazole (PROTONIX) 40 MG tablet TAKE 1 TABLET BY MOUTH TWICE A DAY 60 tablet 0   REPATHA SURECLICK 254 MG/ML SOAJ INJECT 140MG SUBCUTANEOUSLY EVERY 2 WEEKS (14 DAYS) AS DIRECTED 6 mL 3   tamsulosin (FLOMAX) 0.4 MG CAPS capsule Take 0.8 mg by mouth in the morning.     TRUEplus Lancets 33G MISC TRUEplus Lancets 33 gauge     No current facility-administered medications for this visit.     Review of Systems    He denies chest pain, palpitations, dyspnea, pnd, orthopnea, n, v, dizziness, syncope, edema, weight gain, or early satiety. All other systems reviewed and are otherwise negative except as noted above.   Physical Exam    VS:  BP 134/72   Pulse 64   Ht _0  (1.753 m)   Wt 232 lb 3.2 oz (105.3 kg)   SpO2 93%   BMI 34.29 kg/m  GEN: Well nourished, well developed, in no acute distress. HEENT: normal. Neck: Supple, no JVD, carotid bruits, or masses. Cardiac: RRR, no murmurs, rubs, or gallops. No clubbing, cyanosis, edema.  Radials/DP/PT 2+ and equal bilaterally.  Respiratory:  Respirations regular and unlabored, clear to auscultation bilaterally. GI: Soft, nontender, nondistended, BS + x 4. MS: no deformity or atrophy. Skin: warm and dry, no rash. Neuro:  Strength and sensation are intact. Psych: Normal affect.  Accessory Clinical Findings    ECG personally reviewed by me today -sinus rhythm, 64 bpm- no acute changes.   Lab Results  Component Value Date   WBC 5.0 05/18/2022   HGB 12.1 (L) 05/18/2022   HCT 36.4 (L) 05/18/2022   MCV 87.5 05/18/2022   PLT 250 05/18/2022    Lab Results  Component Value Date   CREATININE 0.82 03/19/2022   BUN 13 03/19/2022   NA 142 03/19/2022   K 3.8 03/19/2022   CL 111 03/19/2022   CO2 24 03/19/2022   Lab Results  Component Value Date   ALT 7 03/18/2022   AST 33 03/18/2022   ALKPHOS 62 03/18/2022   BILITOT 1.7 (H) 03/18/2022   Lab Results  Component Value Date   CHOL 109 10/06/2021   HDL 67 10/06/2021   LDLCALC 21 10/06/2021   TRIG 124 10/06/2021   CHOLHDL 1.6 10/06/2021    Lab Results  Component Value Date   HGBA1C 6.3 (H) 03/17/2022    Assessment & Plan    1. CAD/dyspnea on exertion: Coronary CTA  in 11/2019 showed coronary calcium score of 250 (56 percentile), plaque in the LAD, unable to perform FFR, otherwise moderate nonobstructive CAD.  Lexiscan Myoview in 12/2019 showed no evidence of ischemia, EF 62%. Stable with no anginal symptoms. No indication for ischemic evaluation.  Continue losartan, Repatha, Lipitor.  2. Hypertension: BP well controlled. Continue current antihypertensive regimen.   3. Hyperlipidemia: LDL was 21 in 09/2021.  He has followed with Dr. Debara Pickett.  He is concerned about his ability to afford Repatha going forward.  He is asking about possible alternative therapies.  I will reach out to Dr. Debara Pickett for recommendations.  For now, continue Repatha, Lipitor.  4. Type 2 diabetes: A1C was 6.3 in 02/2022. Monitored and managed per PCP.   5. Iron deficiency anemia: Hemoglobin was 12.1 on 05/18/2022.  Following with hematology, oncology.    6. Disposition: Follow-up in 6 months.     Lenna Sciara, NP 06/07/2022, 1:42 PM

## 2022-06-07 NOTE — Patient Instructions (Signed)
Medication Instructions:  Your physician recommends that you continue on your current medications as directed. Please refer to the Current Medication list given to you today.   *If you need a refill on your cardiac medications before your next appointment, please call your pharmacy*   Lab Work: NONE ordered at this time of appointment   If you have labs (blood work) drawn today and your tests are completely normal, you will receive your results only by: [place] (if you have MyChart) OR A paper copy in the mail If you have any lab test that is abnormal or we need to change your treatment, we will call you to review the results.   Testing/Procedures: NONE ordered at this time of appointment     Follow-Up: At [facility], you and your health needs are our priority.  As part of our continuing mission to provide you with exceptional heart care, we have created designated Provider Care Teams.  These Care Teams include your primary Cardiologist (physician) and Advanced Practice Providers (APPs -  Physician Assistants and Nurse Practitioners) who all work together to provide you with the care you need, when you need it.  We recommend signing up for the patient portal called "MyChart".  Sign up information is provided on this After Visit Summary.  MyChart is used to connect with patients for Virtual Visits (Telemedicine).  Patients are able to view lab/test results, encounter notes, upcoming appointments, etc.  Non-urgent messages can be sent to your provider as well.   To learn more about what you can do with MyChart, go to [email or web address].    Your next appointment:   6 month(s)  The format for your next appointment:   In Person  Provider:   Pixie Casino, MD     Other Instructions   Important Information About Sugar

## 2022-06-30 DIAGNOSIS — Z23 Encounter for immunization: Secondary | ICD-10-CM | POA: Diagnosis not present

## 2022-06-30 DIAGNOSIS — F411 Generalized anxiety disorder: Secondary | ICD-10-CM | POA: Diagnosis not present

## 2022-06-30 DIAGNOSIS — D509 Iron deficiency anemia, unspecified: Secondary | ICD-10-CM | POA: Diagnosis not present

## 2022-07-13 DIAGNOSIS — E0842 Diabetes mellitus due to underlying condition with diabetic polyneuropathy: Secondary | ICD-10-CM | POA: Diagnosis not present

## 2022-07-13 DIAGNOSIS — G250 Essential tremor: Secondary | ICD-10-CM | POA: Diagnosis not present

## 2022-07-27 DIAGNOSIS — C61 Malignant neoplasm of prostate: Secondary | ICD-10-CM | POA: Diagnosis not present

## 2022-07-27 DIAGNOSIS — Z8546 Personal history of malignant neoplasm of prostate: Secondary | ICD-10-CM | POA: Diagnosis not present

## 2022-08-02 DIAGNOSIS — D509 Iron deficiency anemia, unspecified: Secondary | ICD-10-CM | POA: Diagnosis not present

## 2022-08-03 DIAGNOSIS — N401 Enlarged prostate with lower urinary tract symptoms: Secondary | ICD-10-CM | POA: Diagnosis not present

## 2022-08-03 DIAGNOSIS — R300 Dysuria: Secondary | ICD-10-CM | POA: Diagnosis not present

## 2022-08-03 DIAGNOSIS — I10 Essential (primary) hypertension: Secondary | ICD-10-CM | POA: Diagnosis not present

## 2022-08-03 DIAGNOSIS — R350 Frequency of micturition: Secondary | ICD-10-CM | POA: Diagnosis not present

## 2022-08-14 DIAGNOSIS — G250 Essential tremor: Secondary | ICD-10-CM | POA: Diagnosis not present

## 2022-08-14 DIAGNOSIS — E0842 Diabetes mellitus due to underlying condition with diabetic polyneuropathy: Secondary | ICD-10-CM | POA: Diagnosis not present

## 2022-08-31 DIAGNOSIS — E119 Type 2 diabetes mellitus without complications: Secondary | ICD-10-CM | POA: Diagnosis not present

## 2022-08-31 DIAGNOSIS — R350 Frequency of micturition: Secondary | ICD-10-CM | POA: Diagnosis not present

## 2022-09-13 DIAGNOSIS — R350 Frequency of micturition: Secondary | ICD-10-CM | POA: Diagnosis not present

## 2022-09-13 DIAGNOSIS — N401 Enlarged prostate with lower urinary tract symptoms: Secondary | ICD-10-CM | POA: Diagnosis not present

## 2022-09-15 ENCOUNTER — Other Ambulatory Visit: Payer: Self-pay | Admitting: Internal Medicine

## 2022-09-15 DIAGNOSIS — E785 Hyperlipidemia, unspecified: Secondary | ICD-10-CM

## 2022-09-23 ENCOUNTER — Other Ambulatory Visit: Payer: Self-pay | Admitting: Internal Medicine

## 2022-09-28 DIAGNOSIS — Z961 Presence of intraocular lens: Secondary | ICD-10-CM | POA: Diagnosis not present

## 2022-09-28 DIAGNOSIS — Z7984 Long term (current) use of oral hypoglycemic drugs: Secondary | ICD-10-CM | POA: Diagnosis not present

## 2022-09-28 DIAGNOSIS — E119 Type 2 diabetes mellitus without complications: Secondary | ICD-10-CM | POA: Diagnosis not present

## 2022-09-28 DIAGNOSIS — H524 Presbyopia: Secondary | ICD-10-CM | POA: Diagnosis not present

## 2022-10-12 DIAGNOSIS — A09 Infectious gastroenteritis and colitis, unspecified: Secondary | ICD-10-CM | POA: Diagnosis not present

## 2022-10-26 DIAGNOSIS — D509 Iron deficiency anemia, unspecified: Secondary | ICD-10-CM | POA: Diagnosis not present

## 2022-11-02 DIAGNOSIS — N401 Enlarged prostate with lower urinary tract symptoms: Secondary | ICD-10-CM | POA: Diagnosis not present

## 2022-11-02 DIAGNOSIS — R350 Frequency of micturition: Secondary | ICD-10-CM | POA: Diagnosis not present

## 2022-11-16 ENCOUNTER — Other Ambulatory Visit: Payer: Self-pay

## 2022-11-16 ENCOUNTER — Inpatient Hospital Stay: Payer: Medicare HMO | Attending: Hematology and Oncology | Admitting: Hematology and Oncology

## 2022-11-16 ENCOUNTER — Inpatient Hospital Stay: Payer: Medicare HMO

## 2022-11-16 ENCOUNTER — Encounter: Payer: Self-pay | Admitting: Hematology and Oncology

## 2022-11-16 VITALS — BP 137/57 | HR 67 | Temp 98.0°F | Resp 16 | Ht 69.0 in | Wt 240.3 lb

## 2022-11-16 DIAGNOSIS — D649 Anemia, unspecified: Secondary | ICD-10-CM | POA: Insufficient documentation

## 2022-11-16 DIAGNOSIS — Z87891 Personal history of nicotine dependence: Secondary | ICD-10-CM | POA: Diagnosis not present

## 2022-11-16 DIAGNOSIS — D500 Iron deficiency anemia secondary to blood loss (chronic): Secondary | ICD-10-CM

## 2022-11-16 DIAGNOSIS — R197 Diarrhea, unspecified: Secondary | ICD-10-CM | POA: Insufficient documentation

## 2022-11-16 LAB — CBC WITH DIFFERENTIAL (CANCER CENTER ONLY)
Abs Immature Granulocytes: 0.01 10*3/uL (ref 0.00–0.07)
Basophils Absolute: 0 10*3/uL (ref 0.0–0.1)
Basophils Relative: 1 %
Eosinophils Absolute: 0.1 10*3/uL (ref 0.0–0.5)
Eosinophils Relative: 2 %
HCT: 34.7 % — ABNORMAL LOW (ref 39.0–52.0)
Hemoglobin: 12.3 g/dL — ABNORMAL LOW (ref 13.0–17.0)
Immature Granulocytes: 0 %
Lymphocytes Relative: 23 %
Lymphs Abs: 1.1 10*3/uL (ref 0.7–4.0)
MCH: 33.5 pg (ref 26.0–34.0)
MCHC: 35.4 g/dL (ref 30.0–36.0)
MCV: 94.6 fL (ref 80.0–100.0)
Monocytes Absolute: 0.4 10*3/uL (ref 0.1–1.0)
Monocytes Relative: 8 %
Neutro Abs: 3.2 10*3/uL (ref 1.7–7.7)
Neutrophils Relative %: 66 %
Platelet Count: 252 10*3/uL (ref 150–400)
RBC: 3.67 MIL/uL — ABNORMAL LOW (ref 4.22–5.81)
RDW: 14.5 % (ref 11.5–15.5)
WBC Count: 4.8 10*3/uL (ref 4.0–10.5)
nRBC: 0 % (ref 0.0–0.2)

## 2022-11-16 LAB — IRON AND IRON BINDING CAPACITY (CC-WL,HP ONLY)
Iron: 86 ug/dL (ref 45–182)
Saturation Ratios: 26 % (ref 17.9–39.5)
TIBC: 329 ug/dL (ref 250–450)
UIBC: 243 ug/dL (ref 117–376)

## 2022-11-16 LAB — FERRITIN: Ferritin: 24 ng/mL (ref 24–336)

## 2022-11-16 NOTE — Progress Notes (Signed)
[name], PA-C (Inactive) as PCP - General (Physician Assistant) Pixie Casino, MD as PCP - Cardiology (Cardiology) Festus Aloe, MD as Consulting Physician (Urology) Tyler Pita, MD as Consulting Physician (Radiation Oncology) Clarene Essex, MD as Consulting Physician (Gastroenterology) Benay Pike, MD as Consulting Physician (Hematology and Oncology)  CHIEF COMPLAINTS/PURPOSE OF CONSULTATION:  Anemia  ASSESSMENT & PLAN:   This is a very pleasant 77 year old male patient with past medical history significant for type 2 diabetes, hypertension, dyslipidemia, history of prostate cancer 2 years ago currently on surveillance referred to hematology for evaluation of severe anemia.   He had capsule endoscopy, noted small bowl AVM. He is now on ferrous sulfate BID and protonix once a day. He feels well and denies any blood in stool or black stool since he stopped celebrex No concerns on ROS or PE His CBC from today shows Hb above 12 g/dl he feels remarkably better.  At this time I recommended he continue the iron supplement twice a day.   I will call him back with the ferritin results if he needs to adjust iron supplementation.   He will otherwise return to clinic in 6 months or sooner as needed.  HISTORY OF PRESENTING ILLNESS:  Kevin Olson 77 y.o. male is here because of symptomatic anemia.  He is here for a follow up. 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 He stopped taking celebrex, since then he denies any blood loss in stool. He otherwise feels well except for diarrhea, had been using cholestyramine powder. Rest of the pertinent 10 point ROS reviewed and neg.  REVIEW OF SYSTEMS:   Constitutional: Denies fevers, chills or abnormal night sweats Eyes: Denies blurriness of vision, double vision or watery eyes Ears, nose, mouth, throat, and face: Denies mucositis or sore  throat Respiratory: Denies cough, dyspnea or wheezes Cardiovascular: Denies palpitation, chest discomfort or lower extremity swelling Gastrointestinal:  Denies nausea, heartburn or change in bowel habits Skin: Denies abnormal skin rashes Lymphatics: Denies new lymphadenopathy or easy bruising Neurological:Denies numbness, tingling or new weaknesses Behavioral/Psych: Mood is stable, no new changes  All other systems were reviewed with the patient and are negative.  MEDICAL HISTORY:  Past Medical History:  Diagnosis Date   Depression    DM2 (diabetes mellitus, type 2) ([place])    Dyslipidemia    Family history of brain cancer    Family history of pancreatic cancer    Hypertension    OA (osteoarthritis) of knee    Prostate cancer [facility])    prostate    SURGICAL HISTORY: Past Surgical History:  Procedure Laterality Date   BIOPSY  03/19/2022   Procedure: BIOPSY;  Surgeon: Clarene Essex, MD;  Location: [facility];  Service: Gastroenterology;;   ESOPHAGOGASTRODUODENOSCOPY (EGD) WITH PROPOFOL N/A 03/19/2022   Procedure: ESOPHAGOGASTRODUODENOSCOPY (EGD) WITH PROPOFOL;  Surgeon: Clarene Essex, MD;  Location: [facility];  Service: Gastroenterology;  Laterality: N/A;   INGUINAL HERNIA REPAIR     PROSTATE BIOPSY     TOTAL KNEE ARTHROPLASTY Left 10/26/2020   Procedure: TOTAL KNEE ARTHROPLASTY;  Surgeon: Paralee Cancel, MD;  Location: [facility];  Service: Orthopedics;  Laterality: Left;  70 mins   TOTAL KNEE ARTHROPLASTY Right 12/21/2020   Procedure: TOTAL KNEE ARTHROPLASTY;  Surgeon: Paralee Cancel, MD;  Location: [facility];  Service: Orthopedics;  Laterality: Right;  70 mins    SOCIAL HISTORY: Social History   Socioeconomic History   Marital status:  Married    Spouse name: Not on file   Number of children: Not on file   Years of education: Not on file   Highest education level: Not on file  Occupational History   Occupation: retired    Comment: Architect  Tobacco Use   Smoking status: Former     Packs/day: 2.00    Years: 12.00    Total pack years: 24.00    Types: Cigarettes    Quit date: 04/19/1979    Years since quitting: 43.6   Smokeless tobacco: Never  Vaping Use   Vaping Use: Never used  Substance and Sexual Activity   Alcohol use: Yes    Comment: occas.   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  Other Topics Concern   Not on file  Social History Narrative   Not on file   Social Determinants of Health   Financial Resource Strain: Not on file  Food Insecurity: Not on file  Transportation Needs: Not on file  Physical Activity: Not on file  Stress: Not on file  Social Connections: Not on file  Intimate Partner Violence: Not on file    FAMILY HISTORY: Family History  Problem Relation Age of Onset   CAD Mother    Pancreatic cancer Sister 15   Other Sister        brain tumor   Leukemia Sister    Celiac disease Sister    CAD Brother        CABGx4    CAD Brother        CABGx4   Lung cancer Maternal Grandfather    Lung cancer Paternal Grandfather    Cancer Maternal Aunt        unknown   Celiac disease Maternal Aunt    Cancer Maternal Uncle        unknown   Cancer Cousin        unknown   Spina bifida Niece    Colon cancer Neg Hx    Breast cancer Neg Hx     ALLERGIES:  has No Known Allergies.  MEDICATIONS:  Current Outpatient Medications  Medication Sig Dispense Refill   ALPRAZolam (XANAX) 1 MG tablet Take 1 mg by mouth at bedtime.     atorvastatin (LIPITOR) 10 MG tablet Take 10 mg by mouth every evening.     baclofen (LIORESAL) 10 MG tablet Take 10-20 mg by mouth 2 (two) times daily. Take 1 tablet (10 mg) in the morning and Take 2 tablets (20 mg) at bedtime     Blood Glucose Monitoring Suppl (TRUE METRIX AIR GLUCOSE METER) DEVI True Metrix Air Glucose Meter kit     Cholecalciferol (VITAMIN D3) 50 MCG (2000 UT) TABS Take 2,000 Units by mouth every morning.     Coenzyme Q10-Vitamin E  (QUNOL ULTRA COQ10 PO) Take 1 capsule by mouth at bedtime.     DULoxetine (CYMBALTA) 60 MG capsule Take 60 mg by mouth every evening.     ferrous sulfate 325 (65 FE) MG tablet Take 325 mg by mouth 3 (three) times daily with meals.     glimepiride (AMARYL) 4 MG tablet Take 4 mg by mouth daily with breakfast.     glucose blood (TRUE METRIX BLOOD GLUCOSE TEST) test strip True Metrix Glucose Test Strip     levothyroxine (SYNTHROID) 100 MCG tablet Take 100 mcg by mouth daily before breakfast.     losartan (COZAAR) 25 MG  tablet Take 12.5 mg by mouth every evening.     metFORMIN (GLUCOPHAGE) 1000 MG tablet Take 1,000 mg by mouth in the morning and at bedtime.     pantoprazole (PROTONIX) 40 MG tablet TAKE 1 TABLET BY MOUTH TWICE A DAY 60 tablet 0   REPATHA SURECLICK XX123456 MG/ML SOAJ INJECT 140 MG SUBCUTANEOUSLY EVERY 2 WEEKS (14 DAYS) AS DIRECTED 6 mL 3   tamsulosin (FLOMAX) 0.4 MG CAPS capsule Take 0.8 mg by mouth in the morning.     TRUEplus Lancets 33G MISC TRUEplus Lancets 33 gauge     No current facility-administered medications for this visit.     PHYSICAL EXAMINATION: ECOG PERFORMANCE STATUS: 0 - Asymptomatic  Vitals:   11/16/22 1310  BP: (!) 137/57  Pulse: 67  Resp: 16  Temp: 98 F (36.7 C)  SpO2: 96%    Filed Weights   11/16/22 1310  Weight: 240 lb 4.8 oz (109 kg)     GENERAL:alert, no distress and comfortable SKIN: skin color, texture, turgor are normal, no rashes or significant lesions EYES: normal, conjunctiva are pink and non-injected, sclera clear OROPHARYNX:no exudate, no erythema and lips, buccal mucosa, and tongue normal  NECK: supple, thyroid normal size, non-tender, without nodularity LYMPH:  no palpable lymphadenopathy in the cervical, axillary LUNGS: clear to auscultation and percussion with normal breathing effort HEART: regular rate & rhythm and no murmurs and no lower extremity edema ABDOMEN:abdomen soft, non-tender and normal bowel sounds Musculoskeletal:no  cyanosis of digits and no clubbing  PSYCH: alert & oriented x 3 with fluent speech NEURO: no focal motor/sensory deficits  LABORATORY DATA:  I have reviewed the data as listed Lab Results  Component Value Date   WBC 4.8 11/16/2022   HGB 12.3 (L) 11/16/2022   HCT 34.7 (L) 11/16/2022   MCV 94.6 11/16/2022   PLT 252 11/16/2022     Chemistry      Component Value Date/Time   NA 142 03/19/2022 0728   NA 143 12/11/2019 1543   K 3.8 03/19/2022 0728   CL 111 03/19/2022 0728   CO2 24 03/19/2022 0728   BUN 13 03/19/2022 0728   BUN 17 12/11/2019 1543   CREATININE 0.82 03/19/2022 0728      Component Value Date/Time   CALCIUM 9.2 03/19/2022 0728   ALKPHOS 62 03/18/2022 0820   AST 33 03/18/2022 0820   ALT 7 03/18/2022 0820   BILITOT 1.7 (H) 03/18/2022 0820     Labs from March 09, 2022 showed iron saturation of 3%, TIBC of 496, serum iron of 17 and transferrin levels of 355 consistent with iron deficiency.  His most recent hemoglobin showed 7.6 g/dL, normocytic normochromic, no evidence of leukopenia or thrombocytopenia.  No evidence of kidney impairment, hypercalcemia or elevated total protein, normal liver function tests, no evidence of hypothyroidism TSH is 2.97 Most recent labs reviewed.  Hemoglobin shows significant improvement  CBC from today reviewed, hemoglobin about 12 g/dL RADIOGRAPHIC STUDIES: I have personally reviewed the radiological images as listed and agreed with the findings in the report. No results found.  All questions were answered. The patient knows to call the clinic with any problems, questions or concerns. I spent 20 minutes in the care of this patient including H and P, review of records, counseling and coordination of care.     Benay Pike, MD 11/16/2022 1:17 PM Yes

## 2022-11-17 ENCOUNTER — Telehealth: Payer: Self-pay | Admitting: Hematology and Oncology

## 2022-11-17 NOTE — Telephone Encounter (Signed)
Left patient a vm regarding upcoming appointment

## 2022-12-15 DIAGNOSIS — G250 Essential tremor: Secondary | ICD-10-CM | POA: Diagnosis not present

## 2023-01-02 DIAGNOSIS — E1140 Type 2 diabetes mellitus with diabetic neuropathy, unspecified: Secondary | ICD-10-CM | POA: Diagnosis not present

## 2023-01-02 DIAGNOSIS — E782 Mixed hyperlipidemia: Secondary | ICD-10-CM | POA: Diagnosis not present

## 2023-01-02 DIAGNOSIS — F418 Other specified anxiety disorders: Secondary | ICD-10-CM | POA: Diagnosis not present

## 2023-01-02 DIAGNOSIS — E1165 Type 2 diabetes mellitus with hyperglycemia: Secondary | ICD-10-CM | POA: Diagnosis not present

## 2023-01-02 DIAGNOSIS — Z6836 Body mass index (BMI) 36.0-36.9, adult: Secondary | ICD-10-CM | POA: Diagnosis not present

## 2023-01-02 DIAGNOSIS — I700 Atherosclerosis of aorta: Secondary | ICD-10-CM | POA: Diagnosis not present

## 2023-01-02 DIAGNOSIS — C61 Malignant neoplasm of prostate: Secondary | ICD-10-CM | POA: Diagnosis not present

## 2023-01-02 DIAGNOSIS — D500 Iron deficiency anemia secondary to blood loss (chronic): Secondary | ICD-10-CM | POA: Diagnosis not present

## 2023-01-05 ENCOUNTER — Ambulatory Visit (HOSPITAL_BASED_OUTPATIENT_CLINIC_OR_DEPARTMENT_OTHER): Payer: Medicare HMO | Admitting: Internal Medicine

## 2023-01-15 DIAGNOSIS — E785 Hyperlipidemia, unspecified: Secondary | ICD-10-CM | POA: Diagnosis not present

## 2023-01-16 LAB — LIPID PANEL
Chol/HDL Ratio: 2.3 ratio (ref 0.0–5.0)
Cholesterol, Total: 119 mg/dL (ref 100–199)
HDL: 51 mg/dL (ref >39)
LDL Chol Calc (NIH): 40 mg/dL (ref 0–99)
Triglycerides: 173 mg/dL — ABNORMAL HIGH (ref 0–149)
VLDL Cholesterol Cal: 28 mg/dL (ref 5–40)

## 2023-01-17 ENCOUNTER — Encounter: Payer: Self-pay | Admitting: Internal Medicine

## 2023-01-17 ENCOUNTER — Ambulatory Visit: Payer: Medicare HMO | Attending: Internal Medicine | Admitting: Internal Medicine

## 2023-01-17 VITALS — BP 132/64 | HR 60 | Ht 70.0 in | Wt 235.2 lb

## 2023-01-17 DIAGNOSIS — E785 Hyperlipidemia, unspecified: Secondary | ICD-10-CM

## 2023-01-17 DIAGNOSIS — I10 Essential (primary) hypertension: Secondary | ICD-10-CM | POA: Diagnosis not present

## 2023-01-17 DIAGNOSIS — I2510 Atherosclerotic heart disease of native coronary artery without angina pectoris: Secondary | ICD-10-CM | POA: Diagnosis not present

## 2023-01-17 NOTE — Progress Notes (Signed)
LIPID CLINIC CONSULT NOTE  Chief Complaint:  Follow-up  Primary Care Physician: Roseanna Rainbow, PA-C (Inactive)  Primary Cardiologist:  Chrystie Nose, MD  HPI:  Kevin Olson is a 77 y.o. male who is being seen today for the evaluation of dyslipidemia at the request of Chapman Moss, PA-C. This is a 77 year old male with a history of dyslipidemia and recent diagnosis of prostate cancer status post radiation therapy.  He is about a month out from that.  He was referred for persistent dyslipidemia and risk factor modification.  Recent labs in September 2020 showed total cholesterol 342, triglycerides 394, LDL of 216 and HDL 47.  This is on atorvastatin 80 mg daily which she reports being compliant with.  He says that he has a pretty atherogenic diet.  He eats a lot of sausage and gravy biscuits and other high-fat foods which she has been trying to work on.  Of note that he does have a significant family history of heart disease, concerning that both his oldest and youngest brothers had coronary artery bypass grafting x4.  His mother also had heart disease and high cholesterol.  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  Moreover, today he is reported progressive fatigue and dyspnea.  This started actually prior to his radiation therapy for the prostate however of course intermittently worsened somewhat although is improving.  He says that doing outdoor work such as clean up stuff with his chainsaw and other things in the backyard he is noted progressive fatigue, weakness and exercise intolerance.  This is probably been over the past 6 months to a year.  He denies any chest pain but does get short of breath with exertion.  He reports a very remote stress test but has not been unable to exercise due to knee osteoarthritis.  01/05/2020  Returns today for follow-up of his CT coronary angiogram which I personally reviewed on 12/12/2019.  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  Coronary calcium score was 250, not 56 percentile for age and sex matched control.  I discussed the findings with him today and felt that we could not necessarily exclude ischemia.  He still reports some persistent dyspnea on exertion.  I recommended starting aspirin 81 mg daily and suggested that we should consider functional testing with another modality such as a Myoview.  1/27/20202  Kevin Olson is seen today in follow-up for dyslipidemia.  He he had a virtual visit over the summer.  He is cholesterol on Repatha had resulted in negative value.  I then decreased his atorvastatin from 80 to 40 mg then down to 20 and now 10 mg daily.  He had repeat labs in December 2021 demonstrating a total cholesterol of 92, triglycerides 109, HDL 57 and LDL of 15.  He seems to be tolerating the combination of medications.  He reports that the Repatha is affordable through [facility] at $45 a month.  10/25/2021  Kevin Olson returns today for follow-up.  Overall he says he is doing well though recently says he has been more short of breath.  He was doing a lot of work around the house.  He is actually gained about 20 pounds and he is attributing this to his shortness of breath.  He denies any chest pain.  His blood pressure is well controlled today.  His lipids continue to be well managed.  Total cholesterol 109, HDL 67, triglycerides 124 and LDL 21.  01/17/2023  Kevin Olson is seen today for follow-up.  He has had a busy last year.  He had 2 knee replacements.  He is struggled with prostate issues.  This included prostate cancer.  His cholesterol has not been very well-controlled on the recently as trended upwards.  I think this is primarily dietary or less activity.  Total now 119, triglycerides 173, HDL 51 and LDL 40.  As of 2 years ago his LDL was actually undetectable.  He has remained on 10 mg of  atorvastatin and Repatha.  PMHx:  Past Medical History:  Diagnosis Date   Depression    DM2 (diabetes mellitus, type 2) ([facility])    Dyslipidemia    Family history of brain cancer    Family history of pancreatic cancer    Hypertension    OA (osteoarthritis) of knee    Prostate cancer [facility])    prostate    Past Surgical History:  Procedure Laterality Date   BIOPSY  03/19/2022   Procedure: BIOPSY;  Surgeon: Vida Rigger, MD;  [facility];  Service: Gastroenterology;;   ESOPHAGOGASTRODUODENOSCOPY (EGD) WITH PROPOFOL N/A 03/19/2022   Procedure: ESOPHAGOGASTRODUODENOSCOPY (EGD) WITH PROPOFOL;  Surgeon: Vida Rigger, MD;  [facility];  Service: Gastroenterology;  Laterality: N/A;   INGUINAL HERNIA REPAIR     PROSTATE BIOPSY     TOTAL KNEE ARTHROPLASTY Left 10/26/2020   Procedure: TOTAL KNEE ARTHROPLASTY;  Surgeon: Durene Romans, MD;  Location: [facility];  Service: Orthopedics;  Laterality: Left;  70 mins   TOTAL KNEE ARTHROPLASTY Right 12/21/2020   Procedure: TOTAL KNEE ARTHROPLASTY;  Surgeon: Durene Romans, MD;  Location: [facility];  Service: Orthopedics;  Laterality: Right;  70 mins    FAMHx:  Family History  Problem Relation Age of Onset   CAD Mother    Pancreatic cancer Sister 63   Other Sister        brain tumor   Leukemia Sister    Celiac disease Sister    CAD Brother        CABGx4    CAD Brother        CABGx4   Lung cancer Maternal Grandfather    Lung cancer Paternal Grandfather    Cancer Maternal Aunt        unknown   Celiac disease Maternal Aunt    Cancer Maternal Uncle        unknown   Cancer Cousin        unknown   Spina bifida Niece    Colon cancer Neg Hx    Breast cancer Neg Hx     SOCHx:   reports that he quit smoking about 43 years ago. His smoking use included cigarettes. He has a 24.00 pack-year smoking history. He has never used smokeless tobacco. He reports current alcohol use. He reports that he does not use drugs.  ALLERGIES:  No Known  Allergies  ROS: Pertinent items noted in HPI and remainder of comprehensive ROS otherwise negative.  HOME MEDS: Current Outpatient Medications on File Prior to Visit  Medication Sig Dispense Refill   ALPRAZolam (XANAX) 1 MG tablet Take 1 mg by mouth at bedtime.     atorvastatin (LIPITOR) 10 MG tablet Take 10 mg by mouth every evening.     atorvastatin (LIPITOR) 40 MG tablet      baclofen (LIORESAL) 10 MG tablet Take 10-20 mg by mouth 2 (two) times daily. Take 1 tablet (10 mg)  in the morning and Take 2 tablets (20 mg) at bedtime     Blood Glucose Monitoring Suppl (TRUE METRIX AIR GLUCOSE METER) DEVI True Metrix Air Glucose Meter kit     Cholecalciferol (VITAMIN D3) 50 MCG (2000 UT) TABS Take 2,000 Units by mouth every morning.     Coenzyme Q10-Vitamin E (QUNOL ULTRA COQ10 PO) Take 1 capsule by mouth at bedtime.     cyclobenzaprine (FLEXERIL) 5 MG tablet      DULoxetine (CYMBALTA) 60 MG capsule Take 60 mg by mouth every evening.     ferrous sulfate 325 (65 FE) MG tablet Take 325 mg by mouth 3 (three) times daily with meals.     glimepiride (AMARYL) 4 MG tablet Take 4 mg by mouth daily with breakfast.     glucose blood (TRUE METRIX BLOOD GLUCOSE TEST) test strip True Metrix Glucose Test Strip     levothyroxine (SYNTHROID) 100 MCG tablet Take 100 mcg by mouth daily before breakfast.     losartan (COZAAR) 25 MG tablet Take 12.5 mg by mouth every evening.     metFORMIN (GLUCOPHAGE) 1000 MG tablet Take 1,000 mg by mouth in the morning and at bedtime.     pantoprazole (PROTONIX) 40 MG tablet TAKE 1 TABLET BY MOUTH TWICE A DAY 60 tablet 0   pioglitazone (ACTOS) 15 MG tablet      primidone (MYSOLINE) 50 MG tablet Increase up to 5 tabs qam as directed in addition to the 250mg  at bedtime     REPATHA SURECLICK 140 MG/ML SOAJ INJECT 140 MG SUBCUTANEOUSLY EVERY 2 WEEKS (14 DAYS) AS DIRECTED 6 mL 3   sitaGLIPtin-metformin (JANUMET) 50-1000 MG tablet Take by mouth.     sulfamethoxazole-trimethoprim  (BACTRIM DS) 800-160 MG tablet Take 1 tablet by mouth 2 (two) times daily.     tamsulosin (FLOMAX) 0.4 MG CAPS capsule Take 0.8 mg by mouth in the morning.     TRUEplus Lancets 33G MISC TRUEplus Lancets 33 gauge     primidone (MYSOLINE) 250 MG tablet Take 250 mg by mouth daily.     No current facility-administered medications on file prior to visit.    LABS/IMAGING: No results found for this or any previous visit (from the past 48 hour(s)). No results found.  LIPID PANEL:    Component Value Date/Time   CHOL 119 01/15/2023 0930   TRIG 173 (H) 01/15/2023 0930   HDL 51 01/15/2023 0930   CHOLHDL 2.3 01/15/2023 0930   LDLCALC 40 01/15/2023 0930    WEIGHTS: Wt Readings from Last 3 Encounters:  01/17/23 235 lb 3.2 oz (106.7 kg)  11/16/22 240 lb 4.8 oz (109 kg)  06/07/22 232 lb 3.2 oz (105.3 kg)    VITALS: BP 132/64 (BP Location: Left Arm, Patient Position: Sitting, Cuff Size: Large)   Pulse 60   Ht 5\' 10"  (1.778 m)   Wt 235 lb 3.2 oz (106.7 kg)   SpO2 94%   BMI 33.75 kg/m   EXAM: Deferred  EKG: Deferred  ASSESSMENT: Progressive fatigue/dyspnea-abnormal CT coronary angiogram with 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  FFR could not be performed due to artifact and there was loss of the mid to distal LAD. Marked dyslipidemia, suggestive of familial hyperlipidemia Type 2 diabetes Hypertension Moderate obesity Prostate cancer status post radiation therapy Family history of premature onset coronary disease  PLAN: 1.   Kevin Olson will has reasonably good control of his cholesterol though triglycerides are rising.  I have asked him  to try to focus on dietary and lifestyle modifications and we will not change his meds today.  He denies any chest pain or worsening shortness of breath.  He did have a mildly abnormal CT in the past.  He notes that the symptoms have not worsened.  Will continue our current therapies.  Plan follow-up with me annually  or sooner as necessary.  Chrystie Nose, MD, [facility], FACP  [place]  [facility]  Medical Director of the Advanced Lipid Disorders &  Cardiovascular Risk Reduction Clinic Diplomate of the American Board of Clinical Lipidology Attending Cardiologist  Direct Dial: [phone]  Fax: [phone]  Website:  [email or web address] [email or web address] 01/17/2023, 1:57 PM

## 2023-01-17 NOTE — Patient Instructions (Signed)
Medication Instructions:  NO CHANGES  Lab Work: FASTING lab work to check cholesterol in 12 months -- complete 1 week before next visit  Follow-Up: At [facility], you and your health needs are our priority.  As part of our continuing mission to provide you with exceptional heart care, we have created designated Provider Care Teams.  These Care Teams include your primary Cardiologist (physician) and Advanced Practice Providers (APPs -  Physician Assistants and Nurse Practitioners) who all work together to provide you with the care you need, when you need it.  We recommend signing up for the patient portal called "MyChart".  Sign up information is provided on this After Visit Summary.  MyChart is used to connect with patients for Virtual Visits (Telemedicine).  Patients are able to view lab/test results, encounter notes, upcoming appointments, etc.  Non-urgent messages can be sent to your provider as well.   To learn more about what you can do with MyChart, go to [email or web address].    Your next appointment:   12 months with Dr. Rennis Golden

## 2023-03-19 DIAGNOSIS — I10 Essential (primary) hypertension: Secondary | ICD-10-CM | POA: Diagnosis not present

## 2023-03-19 DIAGNOSIS — F418 Other specified anxiety disorders: Secondary | ICD-10-CM | POA: Diagnosis not present

## 2023-03-19 DIAGNOSIS — E785 Hyperlipidemia, unspecified: Secondary | ICD-10-CM | POA: Diagnosis not present

## 2023-03-19 DIAGNOSIS — Z0000 Encounter for general adult medical examination without abnormal findings: Secondary | ICD-10-CM | POA: Diagnosis not present

## 2023-03-19 DIAGNOSIS — D509 Iron deficiency anemia, unspecified: Secondary | ICD-10-CM | POA: Diagnosis not present

## 2023-03-19 DIAGNOSIS — E1140 Type 2 diabetes mellitus with diabetic neuropathy, unspecified: Secondary | ICD-10-CM | POA: Diagnosis not present

## 2023-03-19 DIAGNOSIS — Z125 Encounter for screening for malignant neoplasm of prostate: Secondary | ICD-10-CM | POA: Diagnosis not present

## 2023-03-19 DIAGNOSIS — Z9181 History of falling: Secondary | ICD-10-CM | POA: Diagnosis not present

## 2023-03-19 DIAGNOSIS — E039 Hypothyroidism, unspecified: Secondary | ICD-10-CM | POA: Diagnosis not present

## 2023-05-07 DIAGNOSIS — S81811A Laceration without foreign body, right lower leg, initial encounter: Secondary | ICD-10-CM | POA: Diagnosis not present

## 2023-05-07 DIAGNOSIS — E119 Type 2 diabetes mellitus without complications: Secondary | ICD-10-CM | POA: Diagnosis not present

## 2023-05-22 ENCOUNTER — Inpatient Hospital Stay: Payer: Medicare HMO | Attending: Hematology and Oncology | Admitting: Hematology and Oncology

## 2023-05-22 ENCOUNTER — Other Ambulatory Visit: Payer: Self-pay

## 2023-05-22 ENCOUNTER — Inpatient Hospital Stay: Payer: Medicare HMO

## 2023-05-22 VITALS — BP 124/67 | HR 62 | Temp 97.5°F | Resp 18 | Ht 70.0 in | Wt 219.7 lb

## 2023-05-22 DIAGNOSIS — D649 Anemia, unspecified: Secondary | ICD-10-CM | POA: Insufficient documentation

## 2023-05-22 DIAGNOSIS — D500 Iron deficiency anemia secondary to blood loss (chronic): Secondary | ICD-10-CM | POA: Diagnosis not present

## 2023-05-22 DIAGNOSIS — E785 Hyperlipidemia, unspecified: Secondary | ICD-10-CM

## 2023-05-22 LAB — CBC WITH DIFFERENTIAL/PLATELET
Abs Immature Granulocytes: 0.01 10*3/uL (ref 0.00–0.07)
Basophils Absolute: 0.1 10*3/uL (ref 0.0–0.1)
Basophils Relative: 1 %
Eosinophils Absolute: 0.1 10*3/uL (ref 0.0–0.5)
Eosinophils Relative: 2 %
HCT: 35.8 % — ABNORMAL LOW (ref 39.0–52.0)
Hemoglobin: 12.7 g/dL — ABNORMAL LOW (ref 13.0–17.0)
Immature Granulocytes: 0 %
Lymphocytes Relative: 29 %
Lymphs Abs: 1.1 10*3/uL (ref 0.7–4.0)
MCH: 34.6 pg — ABNORMAL HIGH (ref 26.0–34.0)
MCHC: 35.5 g/dL (ref 30.0–36.0)
MCV: 97.5 fL (ref 80.0–100.0)
Monocytes Absolute: 0.3 10*3/uL (ref 0.1–1.0)
Monocytes Relative: 7 %
Neutro Abs: 2.3 10*3/uL (ref 1.7–7.7)
Neutrophils Relative %: 61 %
Platelets: 222 10*3/uL (ref 150–400)
RBC: 3.67 MIL/uL — ABNORMAL LOW (ref 4.22–5.81)
RDW: 13 % (ref 11.5–15.5)
WBC: 3.7 10*3/uL — ABNORMAL LOW (ref 4.0–10.5)
nRBC: 0 % (ref 0.0–0.2)

## 2023-05-22 LAB — IRON AND IRON BINDING CAPACITY (CC-WL,HP ONLY)
Iron: 125 ug/dL (ref 45–182)
Saturation Ratios: 46 % — ABNORMAL HIGH (ref 17.9–39.5)
TIBC: 272 ug/dL (ref 250–450)
UIBC: 147 ug/dL (ref 117–376)

## 2023-05-22 LAB — FERRITIN: Ferritin: 24 ng/mL (ref 24–336)

## 2023-05-22 NOTE — Progress Notes (Signed)
[facility] CONSULT NOTE  Patient Care Team: Roseanna Rainbow, PA-C (Inactive) as PCP - General (Physician Assistant) Chrystie Nose, MD as PCP - Cardiology (Cardiology) Jerilee Field, MD as Consulting Physician (Urology) Margaretmary Dys, MD as Consulting Physician (Radiation Oncology) Vida Rigger, MD as Consulting Physician (Gastroenterology) Rachel Moulds, MD as Consulting Physician (Hematology and Oncology)  CHIEF COMPLAINTS/PURPOSE OF CONSULTATION:  Anemia  ASSESSMENT & PLAN:   This is a very pleasant 77 year old male patient with past medical history significant for type 2 diabetes, hypertension, dyslipidemia, history of prostate cancer 2 years ago currently on surveillance referred to hematology for evaluation of severe anemia.   Since his last visit here, he has lost about 24 pounds of weight, is going to [facility] weight loss clinic and is using some serum.  He denies any change in his bowel habits.  No more hematochezia or melena.  He overall feels quite well.  He is continuing to stay active.  Diarrhea still remains a problem and he has not been able to afford cholestyramine.  On physical exam there appears to be no major concerns. CBC showed hemoglobin at 12.7 g, improved compared to February, mildly low white blood cell count, normal platelet count.  Ferritin recorded 24 which is the same as it was 6 months ago He can continue surveillance every 6 months at this point or sooner as needed.   HISTORY OF PRESENTING ILLNESS:  Kevin Olson 77 y.o. male is here because of symptomatic anemia.  He is here for a follow up. 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 He stopped taking celebrex, since then he denies any blood loss in stool. Since his last visit here, he denies any further blood in stool, hematochezia or melena.  He has been having some intermittent diarrhea but could not afford cholestyramine.  He was quite thrilled that he is lost  over 24 pounds of weight, following up at [facility] weight loss clinic, apparently they give him some serum that he ingests which has been helping to curb the appetite.  He otherwise denies any major medical issues today.  Rest of the pertinent 10 point ROS reviewed and negative  MEDICAL HISTORY:  Past Medical History:  Diagnosis Date   Depression    DM2 (diabetes mellitus, type 2) (HCC)    Dyslipidemia    Family history of brain cancer    Family history of pancreatic cancer    Hypertension    OA (osteoarthritis) of knee    Prostate cancer [facility])    prostate    SURGICAL HISTORY: Past Surgical History:  Procedure Laterality Date   BIOPSY  03/19/2022   Procedure: BIOPSY;  Surgeon: Vida Rigger, MD;  Location: [facility];  Service: Gastroenterology;;   ESOPHAGOGASTRODUODENOSCOPY (EGD) WITH PROPOFOL N/A 03/19/2022   Procedure: ESOPHAGOGASTRODUODENOSCOPY (EGD) WITH PROPOFOL;  Surgeon: Vida Rigger, MD;  Location: [facility];  Service: Gastroenterology;  Laterality: N/A;   INGUINAL HERNIA REPAIR     PROSTATE BIOPSY     TOTAL KNEE ARTHROPLASTY Left 10/26/2020   Procedure: TOTAL KNEE ARTHROPLASTY;  Surgeon: Durene Romans, MD;  Location: [facility];  Service: Orthopedics;  Laterality: Left;  70 mins   TOTAL KNEE ARTHROPLASTY Right 12/21/2020   Procedure: TOTAL KNEE ARTHROPLASTY;  Surgeon: Durene Romans, MD;  Location: [facility];  Service: Orthopedics;  Laterality: Right;  70 mins    SOCIAL HISTORY: Social History   Socioeconomic History   Marital status: Married    Spouse name: Not on  file   Number of children: Not on file   Years of education: Not on file   Highest education level: Not on file  Occupational History   Occupation: retired    Comment: Holiday representative  Tobacco Use   Smoking status: Former    Current packs/day: 0.00    Average packs/day: 2.0 packs/day for 12.0 years (24.0 ttl pk-yrs)    Types: Cigarettes    Start date: 04/19/1967    Quit date: 04/19/1979    Years since quitting:  44.1   Smokeless tobacco: Never  Vaping Use   Vaping status: Never Used  Substance and Sexual Activity   Alcohol use: Yes    Comment: occas.   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  Other Topics Concern   Not on file  Social History Narrative   Not on file   Social Determinants of Health   Financial Resource Strain: Not on file  Food Insecurity: Not on file  Transportation Needs: Not on file  Physical Activity: Not on file  Stress: Not on file  Social Connections: Not on file  Intimate Partner Violence: Not on file    FAMILY HISTORY: Family History  Problem Relation Age of Onset   CAD Mother    Pancreatic cancer Sister 33   Other Sister        brain tumor   Leukemia Sister    Celiac disease Sister    CAD Brother        CABGx4    CAD Brother        CABGx4   Lung cancer Maternal Grandfather    Lung cancer Paternal Grandfather    Cancer Maternal Aunt        unknown   Celiac disease Maternal Aunt    Cancer Maternal Uncle        unknown   Cancer Cousin        unknown   Spina bifida Niece    Colon cancer Neg Hx    Breast cancer Neg Hx     ALLERGIES:  has No Known Allergies.  MEDICATIONS:  Current Outpatient Medications  Medication Sig Dispense Refill   ALPRAZolam (XANAX) 1 MG tablet Take 1 mg by mouth at bedtime.     atorvastatin (LIPITOR) 10 MG tablet Take 10 mg by mouth every evening.     atorvastatin (LIPITOR) 40 MG tablet      baclofen (LIORESAL) 10 MG tablet Take 10-20 mg by mouth 2 (two) times daily. Take 1 tablet (10 mg) in the morning and Take 2 tablets (20 mg) at bedtime     Blood Glucose Monitoring Suppl (TRUE METRIX AIR GLUCOSE METER) DEVI True Metrix Air Glucose Meter kit     Cholecalciferol (VITAMIN D3) 50 MCG (2000 UT) TABS Take 2,000 Units by mouth every morning.     Coenzyme Q10-Vitamin E (QUNOL ULTRA COQ10 PO) Take 1 capsule by mouth at bedtime.     cyclobenzaprine (FLEXERIL) 5  MG tablet      DULoxetine (CYMBALTA) 60 MG capsule Take 60 mg by mouth every evening.     ferrous sulfate 325 (65 FE) MG tablet Take 325 mg by mouth 3 (three) times daily with meals.     glimepiride (AMARYL) 4 MG tablet Take 4 mg by mouth daily with breakfast.     glucose blood (TRUE METRIX BLOOD GLUCOSE TEST) test strip True Metrix Glucose Test Strip     levothyroxine (  SYNTHROID) 100 MCG tablet Take 100 mcg by mouth daily before breakfast.     losartan (COZAAR) 25 MG tablet Take 12.5 mg by mouth every evening.     metFORMIN (GLUCOPHAGE) 1000 MG tablet Take 1,000 mg by mouth in the morning and at bedtime.     pantoprazole (PROTONIX) 40 MG tablet TAKE 1 TABLET BY MOUTH TWICE A DAY 60 tablet 0   pioglitazone (ACTOS) 15 MG tablet      primidone (MYSOLINE) 250 MG tablet Take 250 mg by mouth daily.     primidone (MYSOLINE) 50 MG tablet Increase up to 5 tabs qam as directed in addition to the 250mg  at bedtime     REPATHA SURECLICK 140 MG/ML SOAJ INJECT 140 MG SUBCUTANEOUSLY EVERY 2 WEEKS (14 DAYS) AS DIRECTED 6 mL 3   sitaGLIPtin-metformin (JANUMET) 50-1000 MG tablet Take by mouth.     sulfamethoxazole-trimethoprim (BACTRIM DS) 800-160 MG tablet Take 1 tablet by mouth 2 (two) times daily.     tamsulosin (FLOMAX) 0.4 MG CAPS capsule Take 0.8 mg by mouth in the morning.     TRUEplus Lancets 33G MISC TRUEplus Lancets 33 gauge     No current facility-administered medications for this visit.     PHYSICAL EXAMINATION: ECOG PERFORMANCE STATUS: 0 - Asymptomatic  Vitals:   05/22/23 1318  BP: 124/67  Pulse: 62  Resp: 18  Temp: (!) 97.5 F (36.4 C)  SpO2: 100%    Filed Weights   05/22/23 1318  Weight: 219 lb 11.2 oz (99.7 kg)     GENERAL:alert, no distress and comfortable SKIN: skin color, texture, turgor are normal, no rashes or significant lesions EYES: normal, conjunctiva are pink and non-injected, sclera clear OROPHARYNX:no exudate, no erythema and lips, buccal mucosa, and tongue  normal  NECK: supple, thyroid normal size, non-tender, without nodularity LYMPH:  no palpable lymphadenopathy in the cervical, axillary LUNGS: clear to auscultation and percussion with normal breathing effort HEART: regular rate & rhythm and no murmurs and no lower extremity edema ABDOMEN:abdomen soft, non-tender and normal bowel sounds Musculoskeletal:no cyanosis of digits and no clubbing  PSYCH: alert & oriented x 3 with fluent speech NEURO: no focal motor/sensory deficits  LABORATORY DATA:  I have reviewed the data as listed Lab Results  Component Value Date   WBC 4.8 11/16/2022   HGB 12.3 (L) 11/16/2022   HCT 34.7 (L) 11/16/2022   MCV 94.6 11/16/2022   PLT 252 11/16/2022     Chemistry      Component Value Date/Time   NA 142 03/19/2022 0728   NA 143 12/11/2019 1543   K 3.8 03/19/2022 0728   CL 111 03/19/2022 0728   CO2 24 03/19/2022 0728   BUN 13 03/19/2022 0728   BUN 17 12/11/2019 1543   CREATININE 0.82 03/19/2022 0728      Component Value Date/Time   CALCIUM 9.2 03/19/2022 0728   ALKPHOS 62 03/18/2022 0820   AST 33 03/18/2022 0820   ALT 7 03/18/2022 0820   BILITOT 1.7 (H) 03/18/2022 0820     Labs from today reviewed, mild leukopenia, anemia is better, ferritin levels are stable.  RADIOGRAPHIC STUDIES: I have personally reviewed the radiological images as listed and agreed with the findings in the report. No results found.  All questions were answered. The patient knows to call the clinic with any problems, questions or concerns. I spent 20 minutes in the care of this patient including H and P, review of records, counseling and coordination of care.  Rachel Moulds, MD 05/22/2023 1:24 PM Yes

## 2023-06-06 DIAGNOSIS — D509 Iron deficiency anemia, unspecified: Secondary | ICD-10-CM | POA: Diagnosis not present

## 2023-06-06 DIAGNOSIS — E1140 Type 2 diabetes mellitus with diabetic neuropathy, unspecified: Secondary | ICD-10-CM | POA: Diagnosis not present

## 2023-06-06 DIAGNOSIS — I10 Essential (primary) hypertension: Secondary | ICD-10-CM | POA: Diagnosis not present

## 2023-06-06 DIAGNOSIS — L03116 Cellulitis of left lower limb: Secondary | ICD-10-CM | POA: Diagnosis not present

## 2023-06-06 DIAGNOSIS — Z6832 Body mass index (BMI) 32.0-32.9, adult: Secondary | ICD-10-CM | POA: Diagnosis not present

## 2023-06-08 DIAGNOSIS — Z23 Encounter for immunization: Secondary | ICD-10-CM | POA: Diagnosis not present

## 2023-06-18 DIAGNOSIS — E0842 Diabetes mellitus due to underlying condition with diabetic polyneuropathy: Secondary | ICD-10-CM | POA: Diagnosis not present

## 2023-06-18 DIAGNOSIS — G250 Essential tremor: Secondary | ICD-10-CM | POA: Diagnosis not present

## 2023-07-30 DIAGNOSIS — Z8546 Personal history of malignant neoplasm of prostate: Secondary | ICD-10-CM | POA: Diagnosis not present

## 2023-07-30 DIAGNOSIS — R350 Frequency of micturition: Secondary | ICD-10-CM | POA: Diagnosis not present

## 2023-07-30 DIAGNOSIS — N401 Enlarged prostate with lower urinary tract symptoms: Secondary | ICD-10-CM | POA: Diagnosis not present

## 2023-10-01 DIAGNOSIS — E785 Hyperlipidemia, unspecified: Secondary | ICD-10-CM | POA: Diagnosis not present

## 2023-10-01 DIAGNOSIS — E039 Hypothyroidism, unspecified: Secondary | ICD-10-CM | POA: Diagnosis not present

## 2023-10-01 DIAGNOSIS — E1140 Type 2 diabetes mellitus with diabetic neuropathy, unspecified: Secondary | ICD-10-CM | POA: Diagnosis not present

## 2023-10-01 DIAGNOSIS — I10 Essential (primary) hypertension: Secondary | ICD-10-CM | POA: Diagnosis not present

## 2023-10-09 ENCOUNTER — Other Ambulatory Visit: Payer: Self-pay | Admitting: Internal Medicine

## 2023-10-09 DIAGNOSIS — E785 Hyperlipidemia, unspecified: Secondary | ICD-10-CM

## 2023-10-09 DIAGNOSIS — I2510 Atherosclerotic heart disease of native coronary artery without angina pectoris: Secondary | ICD-10-CM

## 2023-10-16 DIAGNOSIS — E113293 Type 2 diabetes mellitus with mild nonproliferative diabetic retinopathy without macular edema, bilateral: Secondary | ICD-10-CM | POA: Diagnosis not present

## 2023-10-16 DIAGNOSIS — Z961 Presence of intraocular lens: Secondary | ICD-10-CM | POA: Diagnosis not present

## 2023-10-16 DIAGNOSIS — H43811 Vitreous degeneration, right eye: Secondary | ICD-10-CM | POA: Diagnosis not present

## 2023-11-02 DIAGNOSIS — S40011D Contusion of right shoulder, subsequent encounter: Secondary | ICD-10-CM | POA: Diagnosis not present

## 2023-11-02 DIAGNOSIS — S40011S Contusion of right shoulder, sequela: Secondary | ICD-10-CM | POA: Diagnosis not present

## 2023-11-02 DIAGNOSIS — L03113 Cellulitis of right upper limb: Secondary | ICD-10-CM | POA: Diagnosis not present

## 2023-11-02 DIAGNOSIS — M19011 Primary osteoarthritis, right shoulder: Secondary | ICD-10-CM | POA: Diagnosis not present

## 2023-11-02 DIAGNOSIS — S40011A Contusion of right shoulder, initial encounter: Secondary | ICD-10-CM | POA: Diagnosis not present

## 2023-11-10 ENCOUNTER — Telehealth: Payer: Self-pay | Admitting: Hematology and Oncology

## 2023-11-10 NOTE — Telephone Encounter (Signed)
 Left patient wife a vm regarding upcoming appointment

## 2023-11-13 DIAGNOSIS — G5601 Carpal tunnel syndrome, right upper limb: Secondary | ICD-10-CM | POA: Diagnosis not present

## 2023-11-13 DIAGNOSIS — M25531 Pain in right wrist: Secondary | ICD-10-CM | POA: Diagnosis not present

## 2023-11-20 DIAGNOSIS — G5601 Carpal tunnel syndrome, right upper limb: Secondary | ICD-10-CM | POA: Diagnosis not present

## 2023-11-26 ENCOUNTER — Ambulatory Visit: Payer: Medicare HMO | Admitting: Hematology and Oncology

## 2023-11-26 DIAGNOSIS — G5601 Carpal tunnel syndrome, right upper limb: Secondary | ICD-10-CM | POA: Diagnosis not present

## 2023-11-28 ENCOUNTER — Other Ambulatory Visit: Payer: Self-pay

## 2023-11-28 ENCOUNTER — Encounter (HOSPITAL_BASED_OUTPATIENT_CLINIC_OR_DEPARTMENT_OTHER): Payer: Self-pay | Admitting: Orthopedic Surgery

## 2023-11-28 NOTE — Progress Notes (Signed)
   11/28/23 1402  Pre-op Phone Call  Surgery Date Verified 12/05/23  Arrival Time Verified 0845  Surgery Location Verified [facility] [place]  Medical History Reviewed Yes  Is the patient taking a GLP-1 receptor agonist? No  Does the patient have diabetes? Type II  Does the patient use a Continuous Blood Glucose Monitor? No  Is the patient on an insulin pump? No  Has the diabetes coordinator been notified? No  Do you have a history of heart problems? No  Antiarrhythmic device type  (NA)  Does patient have other implanted devices? No  Patient Teaching Pre / Post Procedure  Patient educated about smoking cessation 24 hours prior to surgery. N/A Non-Smoker  Patient verbalizes understanding of bowel prep? N/A  Med Rec Completed Yes  Take the Following Meds the Morning of Surgery protonix AM of sx with sip water; other meds take after surgery; hold MVI/herb/supp x5d  Recent  Lab Work, EKG, CXR? No  NPO (Including gum & candy) After midnight  Stop Solids, Milk, Candy, and Gum STARTING AT MIDNIGHT  Responsible adult to drive and be with you for 24 hours? Yes  Name & Phone Number for Ride/Caregiver wife, Cherie  No Jewelry, money, nail polish or make-up.  No lotions, powders, perfumes. No shaving  48 hrs. prior to surgery. Yes  Contacts, Dentures & Glasses Will Have to be Removed Before OR. Yes  Please bring your ID and Insurance Card the morning of your surgery. (Surgery Centers Only) Yes  Bring any papers or x-rays with you that your surgeon gave you. Yes  Instructed to contact the location of procedure/ provider if they or anyone in their household develops symptoms or tests positive for COVID-19, has close contact with someone who tests positive for COVID, or has known exposure to any contagious illness. Yes  Call this number the morning of surgery  with any problems that may cancel your surgery. [phone]  Covid-19 Assessment  Have you had a positive COVID-19 test within the previous 90 days? No   COVID Testing Guidance Proceed with the additional questions.  Patient's surgery required a COVID-19 test (cardiothoracic, complex ENT, and bronchoscopies/ EBUS) No  Have you been unmasked and in close contact with anyone with COVID-19 or COVID-19 symptoms within the past 10 days? No  Do you or anyone in your household currently have any COVID-19 symptoms? No

## 2023-11-29 ENCOUNTER — Inpatient Hospital Stay: Payer: Medicare HMO | Attending: Hematology and Oncology | Admitting: Hematology and Oncology

## 2023-11-29 VITALS — BP 161/70 | HR 107 | Temp 97.2°F | Resp 16 | Wt 230.5 lb

## 2023-11-29 DIAGNOSIS — D649 Anemia, unspecified: Secondary | ICD-10-CM | POA: Diagnosis not present

## 2023-11-29 DIAGNOSIS — Z87891 Personal history of nicotine dependence: Secondary | ICD-10-CM | POA: Insufficient documentation

## 2023-11-29 DIAGNOSIS — D500 Iron deficiency anemia secondary to blood loss (chronic): Secondary | ICD-10-CM

## 2023-11-29 NOTE — Progress Notes (Signed)
  [facility] CONSULT NOTE  Patient Care Team: Roseanna Rainbow, PA-C (Inactive) as PCP - General (Physician Assistant) Chrystie Nose, MD as PCP - Cardiology (Cardiology) Jerilee Field, MD as Consulting Physician (Urology) Margaretmary Dys, MD as Consulting Physician (Radiation Oncology) Vida Rigger, MD as Consulting Physician (Gastroenterology) Rachel Moulds, MD as Consulting Physician (Hematology and Oncology)  CHIEF COMPLAINTS/PURPOSE OF CONSULTATION:  Anemia  ASSESSMENT & PLAN:   This is a very pleasant 78 year old male patient with past medical history significant for type 2 diabetes, hypertension, dyslipidemia, history of prostate cancer 2 years ago currently on surveillance referred to hematology for evaluation of severe anemia.       HISTORY OF PRESENTING ILLNESS:  Kevin Olson 78 y.o. male is here because of anemia.    Rest of the pertinent 10 point ROS reviewed and negative  MEDICAL HISTORY:  Past Medical History:  Diagnosis Date   Depression    DM2 (diabetes mellitus, type 2) (HCC)    Dyslipidemia    Family history of brain cancer    Family history of pancreatic cancer    Hypertension    OA (osteoarthritis) of knee    Prostate cancer [facility])    prostate    SURGICAL HISTORY: Past Surgical History:  Procedure Laterality Date   BIOPSY  03/19/2022   Procedure: BIOPSY;  Surgeon: Vida Rigger, MD;  [facility];  Service: Gastroenterology;;   ESOPHAGOGASTRODUODENOSCOPY (EGD) WITH PROPOFOL N/A 03/19/2022   Procedure: ESOPHAGOGASTRODUODENOSCOPY (EGD) WITH PROPOFOL;  Surgeon: Vida Rigger, MD;  [facility];  Service: Gastroenterology;  Laterality: N/A;   INGUINAL HERNIA REPAIR     PROSTATE BIOPSY     TOTAL KNEE ARTHROPLASTY Left 10/26/2020   Procedure: TOTAL KNEE ARTHROPLASTY;  Surgeon: Durene Romans, MD;  [facility]: [facility];  Service: Orthopedics;  Laterality: Left;  70 mins   TOTAL KNEE ARTHROPLASTY Right 12/21/2020   Procedure:  TOTAL KNEE ARTHROPLASTY;  Surgeon: Durene Romans, MD;  Location[facility];  Service: Orthopedics;  Laterality: Right;  70 mins    SOCIAL HISTORY: Social History   Socioeconomic History   Marital status: Married    Spouse name: Not on file   Number of children: Not on file   Years of education: Not on file   Highest education level: Not on file  Occupational History   Occupation: retired    Comment: Holiday representative  Tobacco Use   Smoking status: Former    Current packs/day: 0.00    Average packs/day: 2.0 packs/day for 12.0 years (24.0 ttl pk-yrs)    Types: Cigarettes    Start date: 04/19/1967    Quit date: 04/19/1979    Years since quitting: 44.6   Smokeless tobacco: Never  Vaping Use   Vaping status: Never Used  Substance and Sexual Activity   Alcohol use: Yes    Comment: occas.   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  Other Topics Concern   Not on file  Social History Narrative   Not on file   Social Drivers of Health   Financial Resource Strain: Not on file  Food Insecurity: Not on file  Transportation Needs: Not on file  Physical Activity: Not on file  Stress: Not on file  Social Connections: Not on file  Intimate Partner Violence: Not on file    FAMILY HISTORY: Family History  Problem Relation Age of Onset   CAD Mother    Pancreatic cancer Sister  40   Other Sister        brain tumor   Leukemia Sister    Celiac disease Sister    CAD Brother        CABGx4    CAD Brother        CABGx4   Lung cancer Maternal Grandfather    Lung cancer Paternal Grandfather    Cancer Maternal Aunt        unknown   Celiac disease Maternal Aunt    Cancer Maternal Uncle        unknown   Cancer Cousin        unknown   Spina bifida Niece    Colon cancer Neg Hx    Breast cancer Neg Hx     ALLERGIES:  has no known allergies.  MEDICATIONS:  Current Outpatient Medications  Medication Sig Dispense Refill    ALPRAZolam (XANAX) 1 MG tablet Take 1 mg by mouth at bedtime.     atorvastatin (LIPITOR) 10 MG tablet Take 10 mg by mouth every evening.     atorvastatin (LIPITOR) 40 MG tablet      baclofen (LIORESAL) 10 MG tablet Take 10-20 mg by mouth 2 (two) times daily. Take 1 tablet (10 mg) in the morning and Take 2 tablets (20 mg) at bedtime     Blood Glucose Monitoring Suppl (TRUE METRIX AIR GLUCOSE METER) DEVI True Metrix Air Glucose Meter kit     Cholecalciferol (VITAMIN D3) 50 MCG (2000 UT) TABS Take 2,000 Units by mouth every morning.     Coenzyme Q10-Vitamin E (QUNOL ULTRA COQ10 PO) Take 1 capsule by mouth at bedtime.     cyclobenzaprine (FLEXERIL) 5 MG tablet      DULoxetine (CYMBALTA) 60 MG capsule Take 60 mg by mouth every evening.     Evolocumab (REPATHA SURECLICK) 140 MG/ML SOAJ INJECT 140 MG UNDER THE SKIN EVERY 2 WEEKS (14 DAYS) AS DIRECTED 6 mL 1   ferrous sulfate 325 (65 FE) MG tablet Take 325 mg by mouth 3 (three) times daily with meals.     glimepiride (AMARYL) 4 MG tablet Take 4 mg by mouth daily with breakfast.     glucose blood (TRUE METRIX BLOOD GLUCOSE TEST) test strip True Metrix Glucose Test Strip     levothyroxine (SYNTHROID) 100 MCG tablet Take 100 mcg by mouth daily before breakfast.     losartan (COZAAR) 25 MG tablet Take 12.5 mg by mouth every evening.     metFORMIN (GLUCOPHAGE) 1000 MG tablet Take 1,000 mg by mouth in the morning and at bedtime.     pantoprazole (PROTONIX) 40 MG tablet TAKE 1 TABLET BY MOUTH TWICE A DAY 60 tablet 0   pioglitazone (ACTOS) 15 MG tablet      primidone (MYSOLINE) 250 MG tablet Take 250 mg by mouth daily.     primidone (MYSOLINE) 50 MG tablet Increase up to 5 tabs qam as directed in addition to the 250mg  at bedtime     sitaGLIPtin-metformin (JANUMET) 50-1000 MG tablet Take by mouth.     sulfamethoxazole-trimethoprim (BACTRIM DS) 800-160 MG tablet Take 1 tablet by mouth 2 (two) times daily.     tamsulosin (FLOMAX) 0.4 MG CAPS capsule Take 0.8 mg  by mouth in the morning.     TRUEplus Lancets 33G MISC TRUEplus Lancets 33 gauge     No current facility-administered medications for this visit.     PHYSICAL EXAMINATION: ECOG PERFORMANCE STATUS: 0 - Asymptomatic  Vitals:   11/29/23 1540  BP: (!) 161/70  Pulse: (!) 107  Resp: 16  Temp: (!) 97.2 F (36.2 C)  SpO2: 95%    Filed Weights   11/29/23 1540  Weight: 230 lb 8 oz (104.6 kg)     GENERAL:alert, no distress and comfortable SKIN: skin color, texture, turgor are normal, no rashes or significant lesions EYES: normal, conjunctiva are pink and non-injected, sclera clear OROPHARYNX:no exudate, no erythema and lips, buccal mucosa, and tongue normal  NECK: supple, thyroid normal size, non-tender, without nodularity LYMPH:  no palpable lymphadenopathy in the cervical, axillary LUNGS: clear to auscultation and percussion with normal breathing effort HEART: regular rate & rhythm and no murmurs and no lower extremity edema ABDOMEN:abdomen soft, non-tender and normal bowel sounds Musculoskeletal:no cyanosis of digits and no clubbing  PSYCH: alert & oriented x 3 with fluent speech NEURO: no focal motor/sensory deficits  LABORATORY DATA:  I have reviewed the data as listed Lab Results  Component Value Date   WBC 3.7 (L) 05/22/2023   HGB 12.7 (L) 05/22/2023   HCT 35.8 (L) 05/22/2023   MCV 97.5 05/22/2023   PLT 222 05/22/2023     Chemistry      Component Value Date/Time   NA 142 03/19/2022 0728   NA 143 12/11/2019 1543   K 3.8 03/19/2022 0728   CL 111 03/19/2022 0728   CO2 24 03/19/2022 0728   BUN 13 03/19/2022 0728   BUN 17 12/11/2019 1543   CREATININE 0.82 03/19/2022 0728      Component Value Date/Time   CALCIUM 9.2 03/19/2022 0728   ALKPHOS 62 03/18/2022 0820   AST 33 03/18/2022 0820   ALT 7 03/18/2022 0820   BILITOT 1.7 (H) 03/18/2022 0820     Labs from today reviewed, mild leukopenia, anemia is better, ferritin levels are stable.  RADIOGRAPHIC  STUDIES: I have personally reviewed the radiological images as listed and agreed with the findings in the report. No results found.  All questions were answered. The patient knows to call the clinic with any problems, questions or concerns. I spent 20 minutes in the care of this patient including H and P, review of records, counseling and coordination of care.     Rachel Moulds, MD 11/29/2023 3:54 PM Yes

## 2023-11-29 NOTE — Progress Notes (Signed)
 [facility] CONSULT NOTE  Patient Care Team: Roseanna Rainbow, PA-C (Inactive) as PCP - General (Physician Assistant) Chrystie Nose, MD as PCP - Cardiology (Cardiology) Jerilee Field, MD as Consulting Physician (Urology) Margaretmary Dys, MD as Consulting Physician (Radiation Oncology) Vida Rigger, MD as Consulting Physician (Gastroenterology) Rachel Moulds, MD as Consulting Physician (Hematology and Oncology)  CHIEF COMPLAINTS/PURPOSE OF CONSULTATION:  Anemia  ASSESSMENT & PLAN:   This is a very pleasant 78 year old male patient with past medical history significant for type 2 diabetes, hypertension, dyslipidemia, history of prostate cancer 2 years ago currently on surveillance referred to hematology for evaluation of severe anemia.  Anemia  Anemia improved with no current symptoms. Blood work in September 2024 showed improvement.  -Weight gain likely due to increased caloric intake. - Order CBC to assess current anemia status. - Instruct him to report any signs of gastrointestinal bleeding, such as melena or hematochezia.  Diarrhea and urgency Diarrhea improved after discontinuing weight loss serum, but urgency persists. Possible infection suggested by another physician. - Monitor symptoms and consider further evaluation with PCP if urgency persists.  Follow-up Plans to move to a retirement center near [place] by year-end, requiring new hematology care. - - Schedule follow-up appointment in six months if still in town. - Advise him to establish care with a hematology group in [place] after moving.  HISTORY OF PRESENTING ILLNESS:   The patient is a 78 year old with anemia who presents for follow-up.  He has a history of anemia, previously attributed to blood loss anemia, but currently has no symptoms such as increased fatigue or blood in the stool. His last blood work in September 2024 showed reasonably good numbers.  He discontinued a weight loss  serum due to developing diarrhea, which he found unacceptable. Since stopping the serum, his bowel movements have improved, but he still experiences extreme urgency, especially if not near a bathroom. He has a good appetite and has regained the weight lost during the serum use. He frequently eats sweets.  He is scheduled for carpal tunnel syndrome surgery on the 19th of this month.  He lives with his family, including a four-month-old great-granddaughter, who brings him joy. He plans to move to [facility] at the end of the year to be closer to family and eventually settle in a retirement community.  MEDICAL HISTORY:  Past Medical History:  Diagnosis Date   Depression    DM2 (diabetes mellitus, type 2) (HCC)    Dyslipidemia    Family history of brain cancer    Family history of pancreatic cancer    Hypertension    OA (osteoarthritis) of knee    Prostate cancer [facility])    prostate    SURGICAL HISTORY: Past Surgical History:  Procedure Laterality Date   BIOPSY  03/19/2022   Procedure: BIOPSY;  Surgeon: Vida Rigger, MD;  Location: [facility];  Service: Gastroenterology;;   ESOPHAGOGASTRODUODENOSCOPY (EGD) WITH PROPOFOL N/A 03/19/2022   Procedure: ESOPHAGOGASTRODUODENOSCOPY (EGD) WITH PROPOFOL;  Surgeon: Vida Rigger, MD;  Location: [facility];  Service: Gastroenterology;  Laterality: N/A;   INGUINAL HERNIA REPAIR     PROSTATE BIOPSY     TOTAL KNEE ARTHROPLASTY Left 10/26/2020   Procedure: TOTAL KNEE ARTHROPLASTY;  Surgeon: Durene Romans, MD;  Location: [facility];  Service: Orthopedics;  Laterality: Left;  70 mins   TOTAL KNEE ARTHROPLASTY Right 12/21/2020   Procedure: TOTAL KNEE ARTHROPLASTY;  Surgeon: Durene Romans, MD;  Location: [facility];  Service: Orthopedics;  Laterality: Right;  70 mins    SOCIAL HISTORY: Social History   Socioeconomic History   Marital status: Married    Spouse name: Not on file   Number of children: Not on file   Years of education: Not on file   Highest education  level: Not on file  Occupational History   Occupation: retired    Comment: Holiday representative  Tobacco Use   Smoking status: Former    Current packs/day: 0.00    Average packs/day: 2.0 packs/day for 12.0 years (24.0 ttl pk-yrs)    Types: Cigarettes    Start date: 04/19/1967    Quit date: 04/19/1979    Years since quitting: 44.6   Smokeless tobacco: Never  Vaping Use   Vaping status: Never Used  Substance and Sexual Activity   Alcohol use: Yes    Comment: occas.   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  Other Topics Concern   Not on file  Social History Narrative   Not on file   Social Drivers of Health   Financial Resource Strain: Not on file  Food Insecurity: Not on file  Transportation Needs: Not on file  Physical Activity: Not on file  Stress: Not on file  Social Connections: Not on file  Intimate Partner Violence: Not on file    FAMILY HISTORY: Family History  Problem Relation Age of Onset   CAD Mother    Pancreatic cancer Sister 40   Other Sister        brain tumor   Leukemia Sister    Celiac disease Sister    CAD Brother        CABGx4    CAD Brother        CABGx4   Lung cancer Maternal Grandfather    Lung cancer Paternal Grandfather    Cancer Maternal Aunt        unknown   Celiac disease Maternal Aunt    Cancer Maternal Uncle        unknown   Cancer Cousin        unknown   Spina bifida Niece    Colon cancer Neg Hx    Breast cancer Neg Hx     ALLERGIES:  has no known allergies.  MEDICATIONS:  Current Outpatient Medications  Medication Sig Dispense Refill   ALPRAZolam (XANAX) 1 MG tablet Take 1 mg by mouth at bedtime.     atorvastatin (LIPITOR) 10 MG tablet Take 10 mg by mouth every evening.     atorvastatin (LIPITOR) 40 MG tablet      baclofen (LIORESAL) 10 MG tablet Take 10-20 mg by mouth 2 (two) times daily. Take 1 tablet (10 mg) in the morning and Take 2 tablets (20 mg) at bedtime      Blood Glucose Monitoring Suppl (TRUE METRIX AIR GLUCOSE METER) DEVI True Metrix Air Glucose Meter kit     Cholecalciferol (VITAMIN D3) 50 MCG (2000 UT) TABS Take 2,000 Units by mouth every morning.     Coenzyme Q10-Vitamin E (QUNOL ULTRA COQ10 PO) Take 1 capsule by mouth at bedtime.     cyclobenzaprine (FLEXERIL) 5 MG tablet      DULoxetine (CYMBALTA) 60 MG capsule Take 60 mg by mouth every evening.     Evolocumab (REPATHA SURECLICK) 140 MG/ML SOAJ INJECT 140 MG UNDER THE SKIN EVERY 2 WEEKS (14 DAYS) AS DIRECTED 6 mL 1   ferrous sulfate 325 (65 FE) MG tablet Take 325  mg by mouth 3 (three) times daily with meals.     glimepiride (AMARYL) 4 MG tablet Take 4 mg by mouth daily with breakfast.     glucose blood (TRUE METRIX BLOOD GLUCOSE TEST) test strip True Metrix Glucose Test Strip     levothyroxine (SYNTHROID) 100 MCG tablet Take 100 mcg by mouth daily before breakfast.     losartan (COZAAR) 25 MG tablet Take 12.5 mg by mouth every evening.     metFORMIN (GLUCOPHAGE) 1000 MG tablet Take 1,000 mg by mouth in the morning and at bedtime.     pantoprazole (PROTONIX) 40 MG tablet TAKE 1 TABLET BY MOUTH TWICE A DAY 60 tablet 0   pioglitazone (ACTOS) 15 MG tablet      primidone (MYSOLINE) 250 MG tablet Take 250 mg by mouth daily.     primidone (MYSOLINE) 50 MG tablet Increase up to 5 tabs qam as directed in addition to the 250mg  at bedtime     sitaGLIPtin-metformin (JANUMET) 50-1000 MG tablet Take by mouth.     sulfamethoxazole-trimethoprim (BACTRIM DS) 800-160 MG tablet Take 1 tablet by mouth 2 (two) times daily.     tamsulosin (FLOMAX) 0.4 MG CAPS capsule Take 0.8 mg by mouth in the morning.     TRUEplus Lancets 33G MISC TRUEplus Lancets 33 gauge     No current facility-administered medications for this visit.     PHYSICAL EXAMINATION: ECOG PERFORMANCE STATUS: 0 - Asymptomatic  Vitals:   11/29/23 1540  BP: (!) 161/70  Pulse: (!) 107  Resp: 16  Temp: (!) 97.2 F (36.2 C)  SpO2: 95%     Filed Weights   11/29/23 1540  Weight: 230 lb 8 oz (104.6 kg)     GENERAL:alert, no distress and comfortable SKIN: skin color, texture, turgor are normal, no rashes or significant lesions EYES: normal, conjunctiva are pink and non-injected, sclera clear OROPHARYNX:no exudate, no erythema and lips, buccal mucosa, and tongue normal  NECK: supple, thyroid normal size, non-tender, without nodularity LYMPH:  no palpable lymphadenopathy in the cervical, axillary LUNGS: clear to auscultation and percussion with normal breathing effort HEART: regular rate & rhythm and no murmurs and no lower extremity edema ABDOMEN:abdomen soft, non-tender and normal bowel sounds Musculoskeletal:no cyanosis of digits and no clubbing  PSYCH: alert & oriented x 3 with fluent speech NEURO: no focal motor/sensory deficits  LABORATORY DATA:  I have reviewed the data as listed Lab Results  Component Value Date   WBC 3.7 (L) 05/22/2023   HGB 12.7 (L) 05/22/2023   HCT 35.8 (L) 05/22/2023   MCV 97.5 05/22/2023   PLT 222 05/22/2023     Chemistry      Component Value Date/Time   NA 142 03/19/2022 0728   NA 143 12/11/2019 1543   K 3.8 03/19/2022 0728   CL 111 03/19/2022 0728   CO2 24 03/19/2022 0728   BUN 13 03/19/2022 0728   BUN 17 12/11/2019 1543   CREATININE 0.82 03/19/2022 0728      Component Value Date/Time   CALCIUM 9.2 03/19/2022 0728   ALKPHOS 62 03/18/2022 0820   AST 33 03/18/2022 0820   ALT 7 03/18/2022 0820   BILITOT 1.7 (H) 03/18/2022 0820     Labs from today  pending  RADIOGRAPHIC STUDIES: I have personally reviewed the radiological images as listed and agreed with the findings in the report. No results found.  All questions were answered. The patient knows to call the clinic with any problems, questions or concerns. I  spent 20 minutes in the care of this patient including H and P, review of records, counseling and coordination of care.     Rachel Moulds, MD 11/29/2023 3:50  PM Yes

## 2023-11-30 ENCOUNTER — Telehealth: Payer: Self-pay | Admitting: Hematology and Oncology

## 2023-11-30 NOTE — Telephone Encounter (Signed)
 Spoke with patient confirming upcoming appointment

## 2023-12-03 ENCOUNTER — Inpatient Hospital Stay

## 2023-12-03 DIAGNOSIS — D649 Anemia, unspecified: Secondary | ICD-10-CM | POA: Diagnosis not present

## 2023-12-03 DIAGNOSIS — D500 Iron deficiency anemia secondary to blood loss (chronic): Secondary | ICD-10-CM

## 2023-12-03 DIAGNOSIS — Z87891 Personal history of nicotine dependence: Secondary | ICD-10-CM | POA: Diagnosis not present

## 2023-12-03 LAB — CBC WITH DIFFERENTIAL/PLATELET
Abs Immature Granulocytes: 0.01 10*3/uL (ref 0.00–0.07)
Basophils Absolute: 0.1 10*3/uL (ref 0.0–0.1)
Basophils Relative: 2 %
Eosinophils Absolute: 0.1 10*3/uL (ref 0.0–0.5)
Eosinophils Relative: 2 %
HCT: 35.2 % — ABNORMAL LOW (ref 39.0–52.0)
Hemoglobin: 12.3 g/dL — ABNORMAL LOW (ref 13.0–17.0)
Immature Granulocytes: 0 %
Lymphocytes Relative: 28 %
Lymphs Abs: 1.2 10*3/uL (ref 0.7–4.0)
MCH: 33.2 pg (ref 26.0–34.0)
MCHC: 34.9 g/dL (ref 30.0–36.0)
MCV: 95.1 fL (ref 80.0–100.0)
Monocytes Absolute: 0.4 10*3/uL (ref 0.1–1.0)
Monocytes Relative: 9 %
Neutro Abs: 2.7 10*3/uL (ref 1.7–7.7)
Neutrophils Relative %: 59 %
Platelets: 250 10*3/uL (ref 150–400)
RBC: 3.7 MIL/uL — ABNORMAL LOW (ref 4.22–5.81)
RDW: 12.9 % (ref 11.5–15.5)
WBC: 4.5 10*3/uL (ref 4.0–10.5)
nRBC: 0 % (ref 0.0–0.2)

## 2023-12-03 LAB — IRON AND IRON BINDING CAPACITY (CC-WL,HP ONLY)
Iron: 129 ug/dL (ref 45–182)
Saturation Ratios: 43 % — ABNORMAL HIGH (ref 17.9–39.5)
TIBC: 302 ug/dL (ref 250–450)
UIBC: 173 ug/dL (ref 117–376)

## 2023-12-03 LAB — FERRITIN: Ferritin: 34 ng/mL (ref 24–336)

## 2023-12-04 ENCOUNTER — Encounter (HOSPITAL_BASED_OUTPATIENT_CLINIC_OR_DEPARTMENT_OTHER)
Admission: RE | Admit: 2023-12-04 | Discharge: 2023-12-04 | Disposition: A | Discharge status: Home / Self Care | Source: Ambulatory Visit | Attending: Orthopedic Surgery | Admitting: Orthopedic Surgery

## 2023-12-04 DIAGNOSIS — Z419 Encounter for procedure for purposes other than remedying health state, unspecified: Secondary | ICD-10-CM | POA: Diagnosis present

## 2023-12-04 DIAGNOSIS — Z01812 Encounter for preprocedural laboratory examination: Secondary | ICD-10-CM | POA: Diagnosis present

## 2023-12-04 DIAGNOSIS — Z01818 Encounter for other preprocedural examination: Secondary | ICD-10-CM | POA: Insufficient documentation

## 2023-12-04 DIAGNOSIS — Z01810 Encounter for preprocedural cardiovascular examination: Secondary | ICD-10-CM | POA: Diagnosis present

## 2023-12-04 LAB — BASIC METABOLIC PANEL
Anion gap: 10 (ref 5–15)
BUN: 17 mg/dL (ref 8–23)
CO2: 21 mmol/L — ABNORMAL LOW (ref 22–32)
Calcium: 8.9 mg/dL (ref 8.9–10.3)
Chloride: 104 mmol/L (ref 98–111)
Creatinine, Ser: 0.97 mg/dL (ref 0.61–1.24)
GFR, Estimated: >60 mL/min (ref >60)
Glucose, Bld: 311 mg/dL — ABNORMAL HIGH (ref 70–99)
Potassium: 4.1 mmol/L (ref 3.5–5.1)
Sodium: 135 mmol/L (ref 135–145)

## 2023-12-04 NOTE — Progress Notes (Signed)

## 2023-12-05 ENCOUNTER — Ambulatory Visit (HOSPITAL_BASED_OUTPATIENT_CLINIC_OR_DEPARTMENT_OTHER)

## 2023-12-05 ENCOUNTER — Encounter (HOSPITAL_BASED_OUTPATIENT_CLINIC_OR_DEPARTMENT_OTHER)
Admission: RE | Disposition: A | Discharge status: Home / Self Care | Payer: Self-pay | Source: Home / Self Care | Attending: Orthopedic Surgery

## 2023-12-05 ENCOUNTER — Encounter (HOSPITAL_BASED_OUTPATIENT_CLINIC_OR_DEPARTMENT_OTHER): Payer: Self-pay | Admitting: Orthopedic Surgery

## 2023-12-05 ENCOUNTER — Other Ambulatory Visit: Payer: Self-pay

## 2023-12-05 ENCOUNTER — Ambulatory Visit (HOSPITAL_BASED_OUTPATIENT_CLINIC_OR_DEPARTMENT_OTHER)
Admission: RE | Admit: 2023-12-05 | Discharge: 2023-12-05 | Disposition: A | Discharge status: Home / Self Care | Attending: Orthopedic Surgery | Admitting: Orthopedic Surgery

## 2023-12-05 DIAGNOSIS — Z87891 Personal history of nicotine dependence: Secondary | ICD-10-CM

## 2023-12-05 DIAGNOSIS — Z419 Encounter for procedure for purposes other than remedying health state, unspecified: Secondary | ICD-10-CM

## 2023-12-05 DIAGNOSIS — I10 Essential (primary) hypertension: Secondary | ICD-10-CM | POA: Diagnosis not present

## 2023-12-05 DIAGNOSIS — Z8249 Family history of ischemic heart disease and other diseases of the circulatory system: Secondary | ICD-10-CM | POA: Insufficient documentation

## 2023-12-05 DIAGNOSIS — Z7984 Long term (current) use of oral hypoglycemic drugs: Secondary | ICD-10-CM | POA: Diagnosis not present

## 2023-12-05 DIAGNOSIS — G5601 Carpal tunnel syndrome, right upper limb: Secondary | ICD-10-CM | POA: Insufficient documentation

## 2023-12-05 DIAGNOSIS — F418 Other specified anxiety disorders: Secondary | ICD-10-CM | POA: Diagnosis not present

## 2023-12-05 DIAGNOSIS — Z79899 Other long term (current) drug therapy: Secondary | ICD-10-CM | POA: Diagnosis not present

## 2023-12-05 DIAGNOSIS — E1141 Type 2 diabetes mellitus with diabetic mononeuropathy: Secondary | ICD-10-CM | POA: Diagnosis not present

## 2023-12-05 DIAGNOSIS — E039 Hypothyroidism, unspecified: Secondary | ICD-10-CM

## 2023-12-05 DIAGNOSIS — E119 Type 2 diabetes mellitus without complications: Secondary | ICD-10-CM | POA: Diagnosis not present

## 2023-12-05 HISTORY — PX: CARPAL TUNNEL RELEASE: SHX101

## 2023-12-05 LAB — GLUCOSE, CAPILLARY
Glucose-Capillary: 196 mg/dL — ABNORMAL HIGH (ref 70–99)
Glucose-Capillary: 199 mg/dL — ABNORMAL HIGH (ref 70–99)

## 2023-12-05 SURGERY — CARPAL TUNNEL RELEASE
Anesthesia: Monitor Anesthesia Care | Site: Wrist | Laterality: Right

## 2023-12-05 MED ORDER — FENTANYL CITRATE (PF) 100 MCG/2ML IJ SOLN
INTRAMUSCULAR | Status: DC | PRN
  Administered 2023-12-05: 50 ug via INTRAVENOUS

## 2023-12-05 MED ORDER — ONDANSETRON HCL 4 MG/2ML IJ SOLN
INTRAMUSCULAR | Status: DC | PRN
  Administered 2023-12-05: 4 mg via INTRAVENOUS

## 2023-12-05 MED ORDER — CEFAZOLIN SODIUM-DEXTROSE 2-4 GM/100ML-% IV SOLN
INTRAVENOUS | Status: AC
  Filled 2023-12-05: qty 100

## 2023-12-05 MED ORDER — DEXAMETHASONE SODIUM PHOSPHATE 10 MG/ML IJ SOLN
INTRAMUSCULAR | Status: AC
  Filled 2023-12-05: qty 1

## 2023-12-05 MED ORDER — BUPIVACAINE HCL (PF) 0.25 % IJ SOLN
INTRAMUSCULAR | Status: DC | PRN
  Administered 2023-12-05: 10 mL

## 2023-12-05 MED ORDER — DROPERIDOL 2.5 MG/ML IJ SOLN: 0.6250 mg | Freq: Once | INTRAMUSCULAR | Status: DC | PRN

## 2023-12-05 MED ORDER — PROPOFOL 500 MG/50ML IV EMUL
INTRAVENOUS | Status: DC | PRN
  Administered 2023-12-05: 75 ug/kg/min via INTRAVENOUS

## 2023-12-05 MED ORDER — DEXAMETHASONE SODIUM PHOSPHATE 4 MG/ML IJ SOLN
INTRAMUSCULAR | Status: DC | PRN
  Administered 2023-12-05: 5 mg via INTRAVENOUS

## 2023-12-05 MED ORDER — 0.9 % SODIUM CHLORIDE (POUR BTL) OPTIME
TOPICAL | Status: DC | PRN
  Administered 2023-12-05: 100 mL

## 2023-12-05 MED ORDER — PROPOFOL 10 MG/ML IV BOLUS
INTRAVENOUS | Status: DC | PRN
  Administered 2023-12-05: 20 mg via INTRAVENOUS

## 2023-12-05 MED ORDER — FENTANYL CITRATE (PF) 100 MCG/2ML IJ SOLN: 25.0000 ug | INTRAMUSCULAR | Status: DC | PRN

## 2023-12-05 MED ORDER — ACETAMINOPHEN 10 MG/ML IV SOLN: 1000.0000 mg | Freq: Once | INTRAVENOUS | Status: DC | PRN

## 2023-12-05 MED ORDER — LACTATED RINGERS IV SOLN: INTRAVENOUS | Status: DC

## 2023-12-05 MED ORDER — CEFAZOLIN SODIUM-DEXTROSE 2-4 GM/100ML-% IV SOLN
2.0000 g | INTRAVENOUS | Status: AC
  Administered 2023-12-05: 2 g via INTRAVENOUS

## 2023-12-05 MED ORDER — FENTANYL CITRATE (PF) 100 MCG/2ML IJ SOLN
INTRAMUSCULAR | Status: AC
  Filled 2023-12-05: qty 2

## 2023-12-05 MED ORDER — ONDANSETRON HCL 4 MG/2ML IJ SOLN
INTRAMUSCULAR | Status: AC
  Filled 2023-12-05: qty 2

## 2023-12-05 MED ORDER — OXYCODONE HCL 5 MG PO TABS: 5.0000 mg | ORAL_TABLET | Freq: Once | ORAL | Status: DC | PRN

## 2023-12-05 MED ORDER — LIDOCAINE 2% (20 MG/ML) 5 ML SYRINGE
INTRAMUSCULAR | Status: AC
  Filled 2023-12-05: qty 5

## 2023-12-05 MED ORDER — OXYCODONE HCL 5 MG PO TABS: 5.0000 mg | ORAL_TABLET | Freq: Four times a day (QID) | ORAL | 0 refills | Status: AC | PRN

## 2023-12-05 MED ORDER — OXYCODONE HCL 5 MG/5ML PO SOLN: 5.0000 mg | Freq: Once | ORAL | Status: DC | PRN

## 2023-12-05 SURGICAL SUPPLY — 24 items
BLADE SURG 15 STRL LF DISP TIS (BLADE) ×1 IMPLANT
BNDG ELASTIC 3INX 5YD STR LF (GAUZE/BANDAGES/DRESSINGS) ×1 IMPLANT
BNDG ESMARK 4X9 LF (GAUZE/BANDAGES/DRESSINGS) ×1 IMPLANT
BNDG GAUZE DERMACEA FLUFF 4 (GAUZE/BANDAGES/DRESSINGS) ×1 IMPLANT
CHLORAPREP W/TINT 26 (MISCELLANEOUS) ×1 IMPLANT
CORD BIPOLAR FORCEPS 12FT (ELECTRODE) ×1 IMPLANT
COVER BACK TABLE 60X90IN (DRAPES) ×1 IMPLANT
CUFF TOURN SGL QUICK 18X4 (TOURNIQUET CUFF) ×1 IMPLANT
DRAPE EXTREMITY T 121X128X90 (DISPOSABLE) ×1 IMPLANT
DRAPE SURG 17X23 STRL (DRAPES) ×1 IMPLANT
GAUZE XEROFORM 1X8 LF (GAUZE/BANDAGES/DRESSINGS) ×1 IMPLANT
GLOVE BIO SURGEON STRL SZ7 (GLOVE) ×1 IMPLANT
GLOVE BIOGEL PI IND STRL 7.0 (GLOVE) ×1 IMPLANT
GOWN STRL REUS W/ TWL LRG LVL3 (GOWN DISPOSABLE) ×2 IMPLANT
NDL HYPO 25X1 1.5 SAFETY (NEEDLE) ×1 IMPLANT
NEEDLE HYPO 25X1 1.5 SAFETY (NEEDLE) ×1 IMPLANT
NS IRRIG 1000ML POUR BTL (IV SOLUTION) ×1 IMPLANT
PACK BASIN DAY SURGERY FS (CUSTOM PROCEDURE TRAY) ×1 IMPLANT
SHEET MEDIUM DRAPE 40X70 STRL (DRAPES) ×1 IMPLANT
SUT ETHILON 4 0 PS 2 18 (SUTURE) ×1 IMPLANT
SYR BULB EAR ULCER 3OZ GRN STR (SYRINGE) ×1 IMPLANT
SYR CONTROL 10ML LL (SYRINGE) ×1 IMPLANT
TOWEL GREEN STERILE FF (TOWEL DISPOSABLE) ×2 IMPLANT
UNDERPAD 30X36 HEAVY ABSORB (UNDERPADS AND DIAPERS) ×1 IMPLANT

## 2023-12-05 NOTE — Anesthesia Postprocedure Evaluation (Signed)
 Anesthesia Post Note  Patient: Kevin Olson  Procedure(s) Performed: CARPAL TUNNEL RELEASE (Right: Wrist)     Patient location during evaluation: PACU Anesthesia Type: MAC Level of consciousness: awake and alert Pain management: pain level controlled Vital Signs Assessment: post-procedure vital signs reviewed and stable Respiratory status: spontaneous breathing, nonlabored ventilation, respiratory function stable and patient connected to nasal cannula oxygen Cardiovascular status: stable and blood pressure returned to baseline Postop Assessment: no apparent nausea or vomiting Anesthetic complications: no   No notable events documented.  Last Vitals:  Vitals:   12/05/23 1115 12/05/23 1128  BP: (!) 165/59 (!) 142/59  Pulse: 60 60  Resp: 13 16  Temp:  (!) 36.1 C  SpO2: 92% 96%    Last Pain:  Vitals:   12/05/23 1128  TempSrc:   PainSc: 0-No pain                  [place]

## 2023-12-05 NOTE — Discharge Instructions (Addendum)
Audria Nine, M.D. Hand Surgery  POST-OPERATIVE DISCHARGE INSTRUCTIONS   PRESCRIPTIONS: - You may have been given a prescription to be taken as directed for post-operative pain control.  You may also take over the counter ibuprofen/aleve and tylenol for pain. Take this as directed on the packaging. Do not exceed 3000 mg tylenol/acetaminophen in 24 hours.  Ibuprofen 600-800 mg (3-4) tablets by mouth every 6 hours as needed for pain.   OR  Aleve 2 tablets by mouth every 12 hours (twice daily) as needed for pain.   AND/OR  Tylenol 1000 mg (2 tablets) every 8 hours as needed for pain.  - Please use your pain medication carefully, as refills are limited and you may not be provided with one.  As stated above, please use over the counter pain medicine - it will also be helpful with decreasing your swelling.    ANESTHESIA: -After your surgery, post-surgical discomfort or pain is likely. This discomfort can last several days to a few weeks. At certain times of the day your discomfort may be more intense.   Did you receive a nerve block?   - A nerve block can provide pain relief for one hour to two days after your surgery. As long as the nerve block is working, you will experience little or no sensation in the area the surgeon operated on.  - As the nerve block wears off, you will begin to experience pain or discomfort. It is very important that you begin taking your prescribed pain medication before the nerve block fully wears off. Treating your pain at the first sign of the block wearing off will ensure your pain is better controlled and more tolerable when full-sensation returns. Do not wait until the pain is intolerable, as the medicine will be less effective. It is better to treat pain in advance than to try and catch up.   General Anesthesia:  If you did not receive a nerve block during your surgery, you will need to start taking your pain medication shortly after your surgery and  should continue to do so as prescribed by your surgeon.     ICE AND ELEVATION: - You may use ice for the first 48-72 hours, but it is not critical.   - Motion of your fingers is very important to decrease the swelling.  - Elevation, as much as possible for the next 48 hours, is critical for decreasing swelling as well as for pain relief. Elevation means when you are seated or lying down, you hand should be at or above your heart. When walking, the hand needs to be at or above the level of your elbow.  - If the bandage gets too tight, it may need to be loosened. Please contact our office and we will instruct you in how to do this.    SURGICAL BANDAGES:  - Keep your dressing and/or splint clean and dry at all times.  You can remove your dressing 7 days from now and change with a dry dressing or Band-Aids as needed thereafter. - You may place a plastic bag over your bandage to shower, but be careful, do not get your bandages wet.  - After the bandages have been removed, it is OK to get the stitches wet in a shower or with hand washing. Do Not soak or submerge the wound yet. Please do not use lotions or creams on the stitches.      HAND THERAPY:  - You may not need any. If you  do, we will begin this at your follow up visit in the clinic.    ACTIVITY AND WORK: - You are encouraged to move any fingers which are not in the bandage.  - Light use of the fingers is allowed to assist the other hand with daily hygiene and eating, but strong gripping or lifting is often uncomfortable and should be avoided.  - You might miss a variable period of time from work and hopefully this issue has been discussed prior to surgery. You may not do any heavy work with your affected hand for about 2 weeks.    [facility] Second Floor, [address] [place], [place] [address] [phone]    Post Anesthesia Home Care Instructions  Activity: Get plenty of rest for the remainder of the day. A  responsible individual must stay with you for 24 hours following the procedure.  For the next 24 hours, DO NOT: -Drive a car -Paediatric nurse -Drink alcoholic beverages -Take any medication unless instructed by your physician -Make any legal decisions or sign important papers.  Meals: Start with liquid foods such as gelatin or soup. Progress to regular foods as tolerated. Avoid greasy, spicy, heavy foods. If nausea and/or vomiting occur, drink only clear liquids until the nausea and/or vomiting subsides. Call your physician if vomiting continues.  Special Instructions/Symptoms: Your throat may feel dry or sore from the anesthesia or the breathing tube placed in your throat during surgery. If this causes discomfort, gargle with warm salt water. The discomfort should disappear within 24 hours.  If you had a scopolamine patch placed behind your ear for the management of post- operative nausea and/or vomiting:  1. The medication in the patch is effective for 72 hours, after which it should be removed.  Wrap patch in a tissue and discard in the trash. Wash hands thoroughly with soap and water. 2. You may remove the patch earlier than 72 hours if you experience unpleasant side effects which may include dry mouth, dizziness or visual disturbances. 3. Avoid touching the patch. Wash your hands with soap and water after contact with the patch.

## 2023-12-05 NOTE — Anesthesia Procedure Notes (Signed)
 Procedure Name: MAC Date/Time: 12/05/2023 10:20 AM  Performed by: Cleda Clarks, CRNAPre-anesthesia Checklist: Patient identified, Emergency Drugs available, Suction available, Patient being monitored and Timeout performed Patient Re-evaluated:Patient Re-evaluated prior to induction Oxygen Delivery Method: Simple face mask Placement Confirmation: positive ETCO2

## 2023-12-05 NOTE — Transfer of Care (Signed)
 Immediate Anesthesia Transfer of Care Note  Patient: Lleyton Byers  Procedure(s) Performed: CARPAL TUNNEL RELEASE (Right: Wrist)  Patient Location: PACU  Anesthesia Type:MAC  Level of Consciousness: awake, alert , and oriented  Airway & Oxygen Therapy: Patient Spontanous Breathing and Patient connected to face mask oxygen  Post-op Assessment: Report given to RN and Post -op Vital signs reviewed and stable  Post vital signs: Reviewed and stable  Last Vitals:  Vitals Value Taken Time  BP    Temp    Pulse 62 12/05/23 1050  Resp    SpO2 96 % 12/05/23 1050  Vitals shown include unfiled device data.  Last Pain:  Vitals:   12/05/23 0924  TempSrc: Oral  PainSc: 0-No pain      Patients Stated Pain Goal: 7 (12/05/23 0924)  Complications: No notable events documented.

## 2023-12-05 NOTE — Op Note (Signed)
   Date of Surgery: 12/05/2023  INDICATIONS: Patient is a 78 y.o.-year-old male with right carpal tunnel syndrome.  Risks, benefits, and alternatives to surgery were again discussed with the patient in the preoperative area. The patient wishes to proceed with surgery.  Informed consent was signed after our discussion.   PREOPERATIVE DIAGNOSIS:  Right carpal tunnel syndrome  POSTOPERATIVE DIAGNOSIS: Same.  PROCEDURE:  Right carpal tunnel release   SURGEON: Waylan Rocher, M.D.  ASSIST:   ANESTHESIA:  Local, MAC  IV FLUIDS AND URINE: See anesthesia.  ESTIMATED BLOOD LOSS: <5 mL.  IMPLANTS: * No implants in log *   DRAINS: None  COMPLICATIONS: None  DESCRIPTION OF PROCEDURE: The patient was met in the preoperative holding area where the surgical site was marked and the informed consent form was signed.  The patient was then brought back to the operating room and remained on the stretcher.  A hand table was placed adjacent to the operative extremity and locked into place.  A tourniquet was placed on the right forearm.  A formal timeout was performed to confirm that this was the correct patient, surgical side, surgical site, and surgical procedure.  All were present and in agreement. Following formal timeout, a local block was performed using 10 mL of 0.25% plain marcaine.  The right upper extremity was then prepped and draped in the usual and sterile fashion.   Following a second timeout, the limb was exsanguinated and the tourniquet inflated to 250 mmHg.  A longitudinal incision was made in line with the radial border of the ring finger from distal to the wrist flexion crease to the intersection of Kaplan's cardinal line.  The skin and subcutaneous tissue was sharply divided.  The longitudinally running palmar fascia was incised.  The thenar musculature was bluntly swept off of the transverse carpal ligament.  The ligament was divided from proximal to distal until the fat surrounding  the palmar arch was encountered. Retractors were then placed in the proximal aspect of the wound to visualize the distal antebrachial fascia.  The fascia was sharply divided under direct visualization.   The wound was then thoroughly irrigated with sterile saline.  The tourniquet was deflated.  Hemostasis was achieved with direct pressure and bipolar electrocautery.  The wound was then closed with 4-0 nylon sutures in a horizontal mattress fashion. The wound was then dressed with xeroform, folded kerlix, and an ace wrap.  The patient was reversed from sedation and the drapes taken down.   All counts were correct x 2 at the end of the procedure.  The patient was then taken to the PACU in stable condition.    POSTOPERATIVE PLAN: He will be discharged to home with appropriate pain medication and discharge instructions.  I'll see him back in 10-14 days for his first postop visit.   Waylan Rocher, MD 11:08 AM

## 2023-12-05 NOTE — Interval H&P Note (Signed)
 History and Physical Interval Note:  12/05/2023 9:21 AM  Kevin Olson  has presented today for surgery, with the diagnosis of Right carpal tunnel syndrome.  The various methods of treatment have been discussed with the patient and family. After consideration of risks, benefits and other options for treatment, the patient has consented to  Procedure(s): CARPAL TUNNEL RELEASE (Right) as a surgical intervention.  The patient's history has been reviewed, patient examined, no change in status, stable for surgery.  I have reviewed the patient's chart and labs.  Questions were answered to the patient's satisfaction.     Dorene Bruni

## 2023-12-05 NOTE — Anesthesia Preprocedure Evaluation (Signed)
 Anesthesia Evaluation  Patient identified by MRN, date of birth, ID band Patient awake    Reviewed: Allergy & Precautions, H&P , NPO status , Patient's Chart, lab work & pertinent test results  History of Anesthesia Complications Negative for: history of anesthetic complications  Airway Mallampati: III  TM Distance: >3 FB Neck ROM: Full    Dental no notable dental hx. (+) Dental Advisory Given   Pulmonary neg pulmonary ROS, former smoker   Pulmonary exam normal        Cardiovascular hypertension, Pt. on medications Normal cardiovascular exam     Neuro/Psych  PSYCHIATRIC DISORDERS Anxiety Depression    negative neurological ROS     GI/Hepatic negative GI ROS, Neg liver ROS,,,  Endo/Other  diabetes, Type 2Hypothyroidism    Renal/GU negative Renal ROS   Prostate cancer    Musculoskeletal  (+) Arthritis , Osteoarthritis,    Abdominal   Peds  Hematology  (+) Blood dyscrasia, anemia   Anesthesia Other Findings Carpal tunnel syndrome  Reproductive/Obstetrics                              Anesthesia Physical Anesthesia Plan  ASA: 3  Anesthesia Plan: MAC   Post-op Pain Management: Minimal or no pain anticipated   Induction:   PONV Risk Score and Plan: 1 and Propofol infusion and TIVA  Airway Management Planned: Simple Face Mask and Natural Airway  Additional Equipment: None  Intra-op Plan:   Post-operative Plan:   Informed Consent: I have reviewed the patients History and Physical, chart, labs and discussed the procedure including the risks, benefits and alternatives for the proposed anesthesia with the patient or authorized representative who has indicated his/her understanding and acceptance.     Dental advisory given  Plan Discussed with: Anesthesiologist and CRNA  Anesthesia Plan Comments:          Anesthesia Quick Evaluation

## 2023-12-05 NOTE — H&P (Signed)
 HAND SURGERY   HPI: Patient is a 78 y.o. male who presents with right carpal tunnel syndrome that was found to be severe in nature on EMG/NCS and has failed conservative management.  Patient denies any changes to their medical history or new systemic symptoms today.    Past Medical History:  Diagnosis Date   Depression    DM2 (diabetes mellitus, type 2) (HCC)    Dyslipidemia    Family history of brain cancer    Family history of pancreatic cancer    Hypertension    OA (osteoarthritis) of knee    Prostate cancer [facility])    prostate   Past Surgical History:  Procedure Laterality Date   BIOPSY  03/19/2022   Procedure: BIOPSY;  Surgeon: Vida Rigger, MD;  Location: [facility];  Service: Gastroenterology;;   ESOPHAGOGASTRODUODENOSCOPY (EGD) WITH PROPOFOL N/A 03/19/2022   Procedure: ESOPHAGOGASTRODUODENOSCOPY (EGD) WITH PROPOFOL;  Surgeon: Vida Rigger, MD;  Location: [facility];  Service: Gastroenterology;  Laterality: N/A;   INGUINAL HERNIA REPAIR     PROSTATE BIOPSY     TOTAL KNEE ARTHROPLASTY Left 10/26/2020   Procedure: TOTAL KNEE ARTHROPLASTY;  Surgeon: Durene Romans, MD;  Location: [facility];  Service: Orthopedics;  Laterality: Left;  70 mins   TOTAL KNEE ARTHROPLASTY Right 12/21/2020   Procedure: TOTAL KNEE ARTHROPLASTY;  Surgeon: Durene Romans, MD;  Location: [facility];  Service: Orthopedics;  Laterality: Right;  70 mins   Social History   Socioeconomic History   Marital status: Married    Spouse name: Not on file   Number of children: Not on file   Years of education: Not on file   Highest education level: Not on file  Occupational History   Occupation: retired    Comment: Holiday representative  Tobacco Use   Smoking status: Former    Current packs/day: 0.00    Average packs/day: 2.0 packs/day for 12.0 years (24.0 ttl pk-yrs)    Types: Cigarettes    Start date: 04/19/1967    Quit date: 04/19/1979    Years since quitting: 44.6   Smokeless tobacco: Never  Vaping Use   Vaping status:  Never Used  Substance and Sexual Activity   Alcohol use: Yes    Comment: occas.   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  Other Topics Concern   Not on file  Social History Narrative   Not on file   Social Drivers of Health   Financial Resource Strain: Not on file  Food Insecurity: Not on file  Transportation Needs: Not on file  Physical Activity: Not on file  Stress: Not on file  Social Connections: Not on file   Family History  Problem Relation Age of Onset   CAD Mother    Pancreatic cancer Sister 26   Other Sister        brain tumor   Leukemia Sister    Celiac disease Sister    CAD Brother        CABGx4    CAD Brother        CABGx4   Lung cancer Maternal Grandfather    Lung cancer Paternal Grandfather    Cancer Maternal Aunt        unknown   Celiac disease Maternal Aunt    Cancer Maternal Uncle        unknown   Cancer Cousin        unknown   Spina bifida Niece  Colon cancer Neg Hx    Breast cancer Neg Hx    - negative except otherwise stated in the family history section No Known Allergies Prior to Admission medications   Medication Sig Start Date End Date Taking? Authorizing Provider  atorvastatin (LIPITOR) 10 MG tablet Take 10 mg by mouth every evening. 09/06/20  Yes [provider]  atorvastatin (LIPITOR) 40 MG tablet  08/14/17  Yes [provider]  baclofen (LIORESAL) 10 MG tablet Take 10-20 mg by mouth 2 (two) times daily. Take 1 tablet (10 mg) in the morning and Take 2 tablets (20 mg) at bedtime 08/27/17  Yes [provider]  Cholecalciferol (VITAMIN D3) 50 MCG (2000 UT) TABS Take 2,000 Units by mouth every morning.   Yes [provider]  Coenzyme Q10-Vitamin E (QUNOL ULTRA COQ10 PO) Take 1 capsule by mouth at bedtime.   Yes [provider]  DULoxetine (CYMBALTA) 60 MG capsule Take 60 mg by mouth every evening. 08/14/17  Yes [provider]  ferrous sulfate 325 (65 FE) MG tablet Take 325 mg by mouth 3 (three) times daily with meals.   Yes [provider]  glimepiride (AMARYL) 4 MG tablet Take 4 mg by mouth daily with breakfast. 08/14/17  Yes [provider]  levothyroxine (SYNTHROID) 100 MCG tablet Take 100 mcg by mouth daily before breakfast. 05/22/19  Yes [provider]  losartan (COZAAR) 25 MG tablet Take 12.5 mg by mouth every evening. 12/24/13  Yes [provider]  metFORMIN (GLUCOPHAGE) 1000 MG tablet Take 1,000 mg by mouth in the morning and at bedtime.   Yes [provider]  pantoprazole (PROTONIX) 40 MG tablet TAKE 1 TABLET BY MOUTH TWICE A DAY 04/10/22  Yes Wendling, Jilda Roche, DO  primidone (MYSOLINE) 250 MG tablet Take 250 mg by mouth daily.   Yes [provider]  primidone (MYSOLINE) 50 MG tablet Increase up to 5 tabs qam as directed in addition to the 250mg  at bedtime 12/15/22  Yes [provider]  tamsulosin (FLOMAX) 0.4 MG CAPS capsule Take 0.8 mg by mouth in the morning.   Yes [provider]  ALPRAZolam Prudy Feeler) 1 MG tablet Take 1 mg by mouth at bedtime. 08/21/17   [provider]  Blood Glucose Monitoring Suppl (TRUE METRIX AIR GLUCOSE METER) DEVI True Metrix Air Glucose Meter kit    [provider]  cyclobenzaprine (FLEXERIL) 5 MG tablet  02/10/14   [provider]  Evolocumab (REPATHA SURECLICK) 140 MG/ML SOAJ INJECT 140 MG UNDER THE SKIN EVERY 2 WEEKS (14 DAYS) AS DIRECTED 10/10/23   Hilty, Lisette Abu, MD  glucose blood (TRUE METRIX BLOOD GLUCOSE TEST) test strip True Metrix Glucose Test Strip    [provider]  pioglitazone (ACTOS) 15 MG tablet  08/14/17   [provider]  sitaGLIPtin-metformin (JANUMET) 50-1000 MG tablet Take by mouth. 09/08/20   [provider]  sulfamethoxazole-trimethoprim (BACTRIM DS) 800-160 MG tablet Take 1 tablet by mouth 2 (two) times daily. 08/03/22    [provider]  TRUEplus Lancets 33G MISC TRUEplus Lancets 33 gauge    [provider]   No results found. - Positive ROS: All other systems have been reviewed and were otherwise negative with the exception of those mentioned in the HPI and as above.  Physical Exam: General: No acute distress, resting comfortably Cardiovascular: BUE warm and well perfused, normal rate Respiratory: Normal WOB on RA Skin: Warm and dry Neurologic: Sensation intact distally Psychiatric: Patient is  at baseline mood and affect  Right Upper Extremity  Positive Tinel and Phalen signs at the wrist.  Negative Durkan sign.  Limited A/PROM of fingers secondary to stiffness.  He has 4/5 thenar strength with mild atrophy.  SILT m/u/r distribution.  His hand is warm and well perfused w/ BCR.     Assessment: 78 yo M w/ right carpal tunnel syndrome that was suggested to be severe in nature with thenar denervation on EMG/NCS and has failed conservative management.   Plan: OR today for right carpal tunnel release with possible flexor tenosynovectomy. We again reviewed the risks of surgery which include bleeding, infection, damage to neurovascular structures, persistent symptoms, pillar pain or scar sensitivity, delayed wound healing, need for additional surgery.  Informed consent was signed.  All questions were answered.   Marlyne Beards, M.D. [facility] 9:18 AM

## 2023-12-06 ENCOUNTER — Encounter: Payer: Self-pay | Admitting: *Deleted

## 2023-12-06 ENCOUNTER — Encounter (HOSPITAL_BASED_OUTPATIENT_CLINIC_OR_DEPARTMENT_OTHER): Payer: Self-pay | Admitting: Orthopedic Surgery

## 2023-12-24 DIAGNOSIS — M79641 Pain in right hand: Secondary | ICD-10-CM | POA: Diagnosis not present

## 2024-01-01 DIAGNOSIS — M79641 Pain in right hand: Secondary | ICD-10-CM | POA: Diagnosis not present

## 2024-01-10 DIAGNOSIS — E039 Hypothyroidism, unspecified: Secondary | ICD-10-CM | POA: Diagnosis not present

## 2024-01-15 DIAGNOSIS — M79641 Pain in right hand: Secondary | ICD-10-CM | POA: Diagnosis not present

## 2024-01-24 ENCOUNTER — Other Ambulatory Visit (HOSPITAL_BASED_OUTPATIENT_CLINIC_OR_DEPARTMENT_OTHER): Payer: Self-pay | Admitting: Internal Medicine

## 2024-01-24 DIAGNOSIS — E785 Hyperlipidemia, unspecified: Secondary | ICD-10-CM | POA: Diagnosis not present

## 2024-01-25 LAB — LIPID PANEL
Chol/HDL Ratio: 2.1 ratio (ref 0.0–5.0)
Cholesterol, Total: 136 mg/dL (ref 100–199)
HDL: 66 mg/dL (ref >39)
LDL Chol Calc (NIH): 40 mg/dL (ref 0–99)
Triglycerides: 189 mg/dL — ABNORMAL HIGH (ref 0–149)
VLDL Cholesterol Cal: 30 mg/dL (ref 5–40)

## 2024-01-29 ENCOUNTER — Ambulatory Visit (HOSPITAL_BASED_OUTPATIENT_CLINIC_OR_DEPARTMENT_OTHER): Payer: Self-pay | Admitting: Internal Medicine

## 2024-01-30 ENCOUNTER — Ambulatory Visit: Payer: Medicare HMO | Attending: Internal Medicine | Admitting: Internal Medicine

## 2024-01-30 ENCOUNTER — Encounter: Payer: Self-pay | Admitting: Internal Medicine

## 2024-01-30 VITALS — BP 128/90 | HR 100 | Ht 70.0 in | Wt 240.6 lb

## 2024-01-30 DIAGNOSIS — E785 Hyperlipidemia, unspecified: Secondary | ICD-10-CM | POA: Diagnosis not present

## 2024-01-30 DIAGNOSIS — I10 Essential (primary) hypertension: Secondary | ICD-10-CM | POA: Diagnosis not present

## 2024-01-30 DIAGNOSIS — I2510 Atherosclerotic heart disease of native coronary artery without angina pectoris: Secondary | ICD-10-CM | POA: Diagnosis not present

## 2024-01-30 MED ORDER — ATORVASTATIN CALCIUM 40 MG PO TABS: 40.0000 mg | ORAL_TABLET | Freq: Every day | ORAL | 3 refills | Status: AC

## 2024-01-30 NOTE — Progress Notes (Signed)
 LIPID CLINIC CONSULT NOTE  Chief Complaint:  Follow-up  Primary Care Physician: Evalina Hilding, PA-C (Inactive)  Primary Cardiologist:  Hazle Lites, MD  HPI:  Kevin Olson is a 78 y.o. male who is being seen today for the evaluation of dyslipidemia at the request of Yong Henle, PA-C. This is a 78 year old male with a history of dyslipidemia and recent diagnosis of prostate cancer status post radiation therapy.  He is about a month out from that.  He was referred for persistent dyslipidemia and risk factor modification.  Recent labs in September 2020 showed total cholesterol 342, triglycerides 394, LDL of 216 and HDL 47.  This is on atorvastatin  80 mg daily which she reports being compliant with.  He says that he has a pretty atherogenic diet.  He eats a lot of sausage and gravy biscuits and other high-fat foods which she has been trying to work on.  Of note that he does have a significant family history of heart disease, concerning that both his oldest and youngest brothers had coronary artery bypass grafting x4.  His mother also had heart disease and high cholesterol.  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  Moreover, today he is reported progressive fatigue and dyspnea.  This started actually prior to his radiation therapy for the prostate however of course intermittently worsened somewhat although is improving.  He says that doing outdoor work such as clean up stuff with his chainsaw and other things in the backyard he is noted progressive fatigue, weakness and exercise intolerance.  This is probably been over the past 6 months to a year.  He denies any chest pain but does get short of breath with exertion.  He reports a very remote stress test but has not been unable to exercise due to knee osteoarthritis.  01/05/2020  Returns today for follow-up of his CT coronary angiogram which I personally reviewed on 12/12/2019.  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  Coronary calcium  score was 250, not 56 percentile for age and sex matched control.  I discussed the findings with him today and felt that we could not necessarily exclude ischemia.  He still reports some persistent dyspnea on exertion.  I recommended starting aspirin  81 mg daily and suggested that we should consider functional testing with another modality such as a Myoview .  1/27/20202  Kevin Olson is seen today in follow-up for dyslipidemia.  He he had a virtual visit over the summer.  He is cholesterol on Repatha  had resulted in negative value.  I then decreased his atorvastatin  from 80 to 40 mg then down to 20 and now 10 mg daily.  He had repeat labs in December 2021 demonstrating a total cholesterol of 92, triglycerides 109, HDL 57 and LDL of 15.  He seems to be tolerating the combination of medications.  He reports that the Repatha  is affordable through [facility] at $45 a month.  10/25/2021  Kevin Olson returns today for follow-up.  Overall he says he is doing well though recently says he has been more short of breath.  He was doing a lot of work around the house.  He is actually gained about 20 pounds and he is attributing this to his shortness of breath.  He denies any chest pain.  His blood pressure is well controlled today.  His lipids continue to be well managed.  Total cholesterol 109, HDL 67, triglycerides 124 and LDL 21.  01/17/2023  Kevin Olson is seen today for follow-up.  He has had a busy last year.  He had 2 knee replacements.  He is struggled with prostate issues.  This included prostate cancer.  His cholesterol has not been very well-controlled on the recently as trended upwards.  I think this is primarily dietary or less activity.  Total now 119, triglycerides 173, HDL 51 and LDL 40.  As of 2 years ago his LDL was actually undetectable.  He has remained on 10 mg of  atorvastatin  and Repatha .  01/30/2024  Kevin Olson is seen today in follow-up.  He seems to be doing well.  He says his blood sugars though recently have not been well-controlled.  He has been under a lot of stress and is taking care of a grandchild.  He has been less physically active.  He has had about 10 pound weight gain in the last 3 months.  His blood pressure initially was elevated but did come back down.  Cholesterol recently showed total 136, triglycerides 189, HDL 66 and LDL 40.  He is on Repatha  and he believes 40 mg atorvastatin .  He had not had a recent refill of this but I did refill it and adjust his med list today.  He denies any chest pain or shortness of breath with exertion.  PMHx:  Past Medical History:  Diagnosis Date   Depression    DM2 (diabetes mellitus, type 2) (HCC)    Dyslipidemia    Family history of brain cancer    Family history of pancreatic cancer    Hypertension    OA (osteoarthritis) of knee    Prostate cancer [facility])    prostate    Past Surgical History:  Procedure Laterality Date   BIOPSY  03/19/2022   Procedure: BIOPSY;  Surgeon: Ozell Blunt, MD;  Location: [facility];  Service: Gastroenterology;;   Ritta Chessman RELEASE Right 12/05/2023   Procedure: CARPAL TUNNEL RELEASE;  Surgeon: Marilyn Shropshire, MD;  Location: [facility];  Service: Orthopedics;  Laterality: Right;   ESOPHAGOGASTRODUODENOSCOPY (EGD) WITH PROPOFOL  N/A 03/19/2022   Procedure: ESOPHAGOGASTRODUODENOSCOPY (EGD) WITH PROPOFOL ;  Surgeon: Ozell Blunt, MD;  Location: [facility];  Service: Gastroenterology;  Laterality: N/A;   INGUINAL HERNIA REPAIR     PROSTATE BIOPSY     TOTAL KNEE ARTHROPLASTY Left 10/26/2020   Procedure: TOTAL KNEE ARTHROPLASTY;  Surgeon: Claiborne Crew, MD;  Location: [facility];  Service: Orthopedics;  Laterality: Left;  70 mins   TOTAL KNEE ARTHROPLASTY Right 12/21/2020   Procedure: TOTAL KNEE ARTHROPLASTY;  Surgeon: Claiborne Crew, MD;  Location: [facility];   Service: Orthopedics;  Laterality: Right;  70 mins    FAMHx:  Family History  Problem Relation Age of Onset   CAD Mother    Pancreatic cancer Sister 74   Other Sister        brain tumor   Leukemia Sister    Celiac disease Sister    CAD Brother        CABGx4    CAD Brother        CABGx4   Lung cancer Maternal Grandfather    Lung cancer Paternal Grandfather    Cancer Maternal Aunt        unknown   Celiac disease Maternal Aunt    Cancer Maternal Uncle        unknown   Cancer Cousin        unknown  Spina bifida Niece    Colon cancer Neg Hx    Breast cancer Neg Hx     SOCHx:   reports that he quit smoking about 44 years ago. His smoking use included cigarettes. He started smoking about 56 years ago. He has a 24 pack-year smoking history. He has never used smokeless tobacco. He reports current alcohol use. He reports that he does not use drugs.  ALLERGIES:  No Known Allergies  ROS: Pertinent items noted in HPI and remainder of comprehensive ROS otherwise negative.  HOME MEDS: Current Outpatient Medications on File Prior to Visit  Medication Sig Dispense Refill   atorvastatin  (LIPITOR) 10 MG tablet Take 10 mg by mouth every evening.     baclofen  (LIORESAL ) 10 MG tablet Take 10-20 mg by mouth 2 (two) times daily. Take 1 tablet (10 mg) in the morning and Take 2 tablets (20 mg) at bedtime     Blood Glucose Monitoring Suppl (TRUE METRIX AIR GLUCOSE METER) DEVI True Metrix Air Glucose Meter kit     Cholecalciferol (VITAMIN D3) 50 MCG (2000 UT) TABS Take 2,000 Units by mouth every morning.     Coenzyme Q10-Vitamin E (QUNOL ULTRA COQ10 PO) Take 1 capsule by mouth at bedtime.     DULoxetine  (CYMBALTA ) 60 MG capsule Take 60 mg by mouth every evening.     Evolocumab  (REPATHA  SURECLICK) 140 MG/ML SOAJ INJECT 140 MG UNDER THE SKIN EVERY 2 WEEKS (14 DAYS) AS DIRECTED 6 mL 1   ferrous sulfate  325 (65 FE) MG tablet Take 325 mg by mouth 3 (three) times daily with meals.     glimepiride   (AMARYL ) 4 MG tablet Take 4 mg by mouth daily with breakfast.     glucose blood (TRUE METRIX BLOOD GLUCOSE TEST) test strip True Metrix Glucose Test Strip     levothyroxine  (SYNTHROID ) 100 MCG tablet Take 100 mcg by mouth daily before breakfast.     losartan  (COZAAR ) 25 MG tablet Take 12.5 mg by mouth every evening.     metFORMIN  (GLUCOPHAGE ) 1000 MG tablet Take 1,000 mg by mouth in the morning and at bedtime.     pantoprazole  (PROTONIX ) 40 MG tablet TAKE 1 TABLET BY MOUTH TWICE A DAY 60 tablet 0   primidone (MYSOLINE) 250 MG tablet Take 250 mg by mouth daily.     primidone (MYSOLINE) 50 MG tablet Increase up to 5 tabs qam as directed in addition to the 250mg  at bedtime     tamsulosin  (FLOMAX ) 0.4 MG CAPS capsule Take 0.8 mg by mouth in the morning.     TRUEplus Lancets 33G MISC TRUEplus Lancets 33 gauge     ALPRAZolam  (XANAX ) 1 MG tablet Take 1 mg by mouth at bedtime.     atorvastatin  (LIPITOR) 40 MG tablet      cyclobenzaprine (FLEXERIL) 5 MG tablet      pioglitazone  (ACTOS ) 15 MG tablet      sitaGLIPtin -metformin  (JANUMET) 50-1000 MG tablet Take by mouth.     sulfamethoxazole-trimethoprim (BACTRIM DS) 800-160 MG tablet Take 1 tablet by mouth 2 (two) times daily.     No current facility-administered medications on file prior to visit.    LABS/IMAGING: No results found for this or any previous visit (from the past 48 hours). No results found.  LIPID PANEL:    Component Value Date/Time   CHOL 136 01/24/2024 0922   TRIG 189 (H) 01/24/2024 0922   HDL 66 01/24/2024 0922   CHOLHDL 2.1 01/24/2024 0922   LDLCALC 40 01/24/2024  5409    WEIGHTS: Wt Readings from Last 3 Encounters:  01/30/24 240 lb 9.6 oz (109.1 kg)  12/05/23 231 lb 4.2 oz (104.9 kg)  11/29/23 230 lb 8 oz (104.6 kg)    VITALS: BP (!) 159/81 (BP Location: Left Arm, Patient Position: Sitting)   Pulse 100   Ht 5\' 10"  (1.778 m)   Wt 240 lb 9.6 oz (109.1 kg)   SpO2 95%   BMI 34.52 kg/m    EXAM: Deferred  EKG: Deferred  ASSESSMENT: Progressive fatigue/dyspnea-abnormal CT coronary angiogram with 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  FFR could not be performed due to artifact and there was loss of the mid to distal LAD. Marked dyslipidemia, suggestive of familial hyperlipidemia Type 2 diabetes Hypertension Moderate obesity Prostate cancer status post radiation therapy Family history of premature onset coronary disease  PLAN: 1.   Kevin Olson has not had any recent chest pain or shortness of breath with exertion.  He reports he still free from prostate cancer as far as he knows.  Blood pressure was initially elevated but did improve.  His diabetes he says is not well-controlled and he will need to work on more exercise and weight loss to help with that.  I refilled his atorvastatin  40 mg daily today and he should continue on that and the Repatha .  Will plan repeat lipids prior to follow-up in 1 year or sooner as necessary.  Hazle Lites, MD, [facility], FNLA, FACP  [place]  [facility]  Medical Director of the Advanced Lipid Disorders &  Cardiovascular Risk Reduction Clinic Diplomate of the American Board of Clinical Lipidology Attending Cardiologist  Direct Dial: [phone]  Fax: [phone]  Website:  [email or web address]ness 01/30/2024, 11:47 AM

## 2024-01-30 NOTE — Patient Instructions (Signed)
 Medication Instructions:  NO CHANGES  *If you need a refill on your cardiac medications before your next appointment, please call your pharmacy*   Follow-Up: At [facility], you and your health needs are our priority.  As part of our continuing mission to provide you with exceptional heart care, our providers are all part of one team.  This team includes your primary Cardiologist (physician) and Advanced Practice Providers or APPs (Physician Assistants and Nurse Practitioners) who all work together to provide you with the care you need, when you need it.  Your next appointment:    12 months with Dr. Maximo Spar  We recommend signing up for the patient portal called "MyChart".  Sign up information is provided on this After Visit Summary.  MyChart is used to connect with patients for Virtual Visits (Telemedicine).  Patients are able to view lab/test results, encounter notes, upcoming appointments, etc.  Non-urgent messages can be sent to your provider as well.   To learn more about what you can do with MyChart, go to [email or web address].

## 2024-02-14 DIAGNOSIS — E039 Hypothyroidism, unspecified: Secondary | ICD-10-CM | POA: Diagnosis not present

## 2024-02-19 DIAGNOSIS — M79641 Pain in right hand: Secondary | ICD-10-CM | POA: Diagnosis not present

## 2024-02-26 DIAGNOSIS — M79641 Pain in right hand: Secondary | ICD-10-CM | POA: Diagnosis not present

## 2024-03-04 DIAGNOSIS — M79641 Pain in right hand: Secondary | ICD-10-CM | POA: Diagnosis not present

## 2024-03-14 ENCOUNTER — Other Ambulatory Visit: Payer: Self-pay | Admitting: Internal Medicine

## 2024-03-14 DIAGNOSIS — I2510 Atherosclerotic heart disease of native coronary artery without angina pectoris: Secondary | ICD-10-CM

## 2024-03-14 DIAGNOSIS — E785 Hyperlipidemia, unspecified: Secondary | ICD-10-CM

## 2024-03-25 DIAGNOSIS — M79641 Pain in right hand: Secondary | ICD-10-CM | POA: Diagnosis not present

## 2024-03-25 DIAGNOSIS — G5601 Carpal tunnel syndrome, right upper limb: Secondary | ICD-10-CM | POA: Diagnosis not present

## 2024-03-26 DIAGNOSIS — E785 Hyperlipidemia, unspecified: Secondary | ICD-10-CM | POA: Diagnosis not present

## 2024-03-26 DIAGNOSIS — D509 Iron deficiency anemia, unspecified: Secondary | ICD-10-CM | POA: Diagnosis not present

## 2024-03-26 DIAGNOSIS — F418 Other specified anxiety disorders: Secondary | ICD-10-CM | POA: Diagnosis not present

## 2024-03-26 DIAGNOSIS — Z0000 Encounter for general adult medical examination without abnormal findings: Secondary | ICD-10-CM | POA: Diagnosis not present

## 2024-03-26 DIAGNOSIS — Z6835 Body mass index (BMI) 35.0-35.9, adult: Secondary | ICD-10-CM | POA: Diagnosis not present

## 2024-03-26 DIAGNOSIS — E039 Hypothyroidism, unspecified: Secondary | ICD-10-CM | POA: Diagnosis not present

## 2024-03-26 DIAGNOSIS — I10 Essential (primary) hypertension: Secondary | ICD-10-CM | POA: Diagnosis not present

## 2024-03-26 DIAGNOSIS — E1140 Type 2 diabetes mellitus with diabetic neuropathy, unspecified: Secondary | ICD-10-CM | POA: Diagnosis not present

## 2024-03-26 DIAGNOSIS — Z125 Encounter for screening for malignant neoplasm of prostate: Secondary | ICD-10-CM | POA: Diagnosis not present

## 2024-04-04 DIAGNOSIS — M79641 Pain in right hand: Secondary | ICD-10-CM | POA: Diagnosis not present

## 2024-04-18 DIAGNOSIS — M79641 Pain in right hand: Secondary | ICD-10-CM | POA: Diagnosis not present

## 2024-04-28 DIAGNOSIS — M79641 Pain in right hand: Secondary | ICD-10-CM | POA: Diagnosis not present

## 2024-05-13 DIAGNOSIS — Z5181 Encounter for therapeutic drug level monitoring: Secondary | ICD-10-CM | POA: Diagnosis not present

## 2024-05-13 DIAGNOSIS — G250 Essential tremor: Secondary | ICD-10-CM | POA: Diagnosis not present

## 2024-05-23 DIAGNOSIS — Z23 Encounter for immunization: Secondary | ICD-10-CM | POA: Diagnosis not present

## 2024-05-23 DIAGNOSIS — G4730 Sleep apnea, unspecified: Secondary | ICD-10-CM | POA: Diagnosis not present

## 2024-05-23 DIAGNOSIS — Z6836 Body mass index (BMI) 36.0-36.9, adult: Secondary | ICD-10-CM | POA: Diagnosis not present

## 2024-05-27 DIAGNOSIS — G5601 Carpal tunnel syndrome, right upper limb: Secondary | ICD-10-CM | POA: Diagnosis not present

## 2024-05-27 DIAGNOSIS — M65931 Unspecified synovitis and tenosynovitis, right forearm: Secondary | ICD-10-CM | POA: Diagnosis not present

## 2024-06-02 ENCOUNTER — Encounter: Payer: Self-pay | Admitting: Hematology and Oncology

## 2024-06-02 ENCOUNTER — Inpatient Hospital Stay: Attending: Hematology and Oncology | Admitting: Hematology and Oncology

## 2024-06-02 ENCOUNTER — Inpatient Hospital Stay

## 2024-06-02 VITALS — BP 154/63 | HR 86 | Temp 98.5°F | Resp 19 | Wt 241.6 lb

## 2024-06-02 DIAGNOSIS — Z8546 Personal history of malignant neoplasm of prostate: Secondary | ICD-10-CM | POA: Diagnosis not present

## 2024-06-02 DIAGNOSIS — D500 Iron deficiency anemia secondary to blood loss (chronic): Secondary | ICD-10-CM

## 2024-06-02 DIAGNOSIS — R197 Diarrhea, unspecified: Secondary | ICD-10-CM | POA: Insufficient documentation

## 2024-06-02 DIAGNOSIS — Z87891 Personal history of nicotine dependence: Secondary | ICD-10-CM | POA: Insufficient documentation

## 2024-06-02 DIAGNOSIS — Z800 Family history of malignant neoplasm of digestive organs: Secondary | ICD-10-CM | POA: Diagnosis not present

## 2024-06-02 DIAGNOSIS — D509 Iron deficiency anemia, unspecified: Secondary | ICD-10-CM | POA: Insufficient documentation

## 2024-06-02 DIAGNOSIS — Z808 Family history of malignant neoplasm of other organs or systems: Secondary | ICD-10-CM | POA: Diagnosis not present

## 2024-06-02 LAB — CBC WITH DIFFERENTIAL/PLATELET
Abs Immature Granulocytes: 0.01 K/uL (ref 0.00–0.07)
Basophils Absolute: 0 K/uL (ref 0.0–0.1)
Basophils Relative: 1 %
Eosinophils Absolute: 0.1 K/uL (ref 0.0–0.5)
Eosinophils Relative: 3 %
HCT: 36 % — ABNORMAL LOW (ref 39.0–52.0)
Hemoglobin: 12.5 g/dL — ABNORMAL LOW (ref 13.0–17.0)
Immature Granulocytes: 0 %
Lymphocytes Relative: 22 %
Lymphs Abs: 1 K/uL (ref 0.7–4.0)
MCH: 33.6 pg (ref 26.0–34.0)
MCHC: 34.7 g/dL (ref 30.0–36.0)
MCV: 96.8 fL (ref 80.0–100.0)
Monocytes Absolute: 0.4 K/uL (ref 0.1–1.0)
Monocytes Relative: 9 %
Neutro Abs: 3 K/uL (ref 1.7–7.7)
Neutrophils Relative %: 65 %
Platelets: 250 K/uL (ref 150–400)
RBC: 3.72 MIL/uL — ABNORMAL LOW (ref 4.22–5.81)
RDW: 12.7 % (ref 11.5–15.5)
WBC: 4.6 K/uL (ref 4.0–10.5)
nRBC: 0 % (ref 0.0–0.2)

## 2024-06-02 LAB — IRON AND IRON BINDING CAPACITY (CC-WL,HP ONLY)
Iron: 143 ug/dL (ref 45–182)
Saturation Ratios: 50 % — ABNORMAL HIGH (ref 17.9–39.5)
TIBC: 287 ug/dL (ref 250–450)
UIBC: 144 ug/dL (ref 117–376)

## 2024-06-02 LAB — FERRITIN: Ferritin: 44 ng/mL (ref 24–336)

## 2024-06-02 NOTE — Progress Notes (Signed)
 [name] CONSULT NOTE  Patient Care Team: Brien Josette HERO, PA-C (Inactive) as PCP - General (Physician Assistant) [name], MD as PCP - Cardiology (Cardiology) Nieves Cough, MD as Consulting Physician (Urology) Patrcia Cough, MD as Consulting Physician (Radiation Oncology) Rosalie Kitchens, MD as Consulting Physician (Gastroenterology) Loretha Ash, MD as Consulting Physician (Hematology and Oncology)  CHIEF COMPLAINTS/PURPOSE OF CONSULTATION:  Anemia  ASSESSMENT & PLAN:   This is a very pleasant 78 year old male patient with past medical history significant for type 2 diabetes, hypertension, dyslipidemia, history of prostate cancer 2 years ago currently on surveillance referred to hematology for evaluation of severe anemia.  Assessment and Plan Assessment & Plan Iron deficiency anemia No symptoms of gastrointestinal bleeding, fatigue, dyspnea, or pica. Blood work needed to evaluate anemia status. - Order blood work to assess current status of iron deficiency anemia.  Chronic diarrhea Chronic diarrhea with urgency, less than five times daily. Possible medication-induced diarrhea from Janumet containing metformin . - Discuss diarrhea with primary care provider to evaluate medication-induced diarrhea and consider adjusting diabetes medication.  RTC in 6 months or sooner for follow up.   HISTORY OF PRESENTING ILLNESS:   The patient is a 78 year old with anemia who presents for follow-up. Discussed the use of AI scribe software for clinical note transcription with the patient, who gave verbal consent to proceed.  History of Present Illness Kevin Olson is a 78 year old male with iron deficiency anemia who presents with persistent diarrhea.  He experiences persistent diarrhea characterized by urgency and occasional incontinence, occurring up to five times daily. No blood or black stools are present. He is unsure about his current antibiotic  use, as his wife manages his medications. He previously used a weight loss serum, which he has discontinued.  He has a history of iron deficiency anemia and has been evaluated for it in the past. No increased fatigue, shortness of breath, or ice cravings are present, which are symptoms he has been asked about in relation to his anemia.  Socially, he is involved in the care of his great-granddaughter, Hospital doctor, who is ten and a half months old.  Rest of the pertinent 10 point ROS reviewed and neg.   MEDICAL HISTORY:  Past Medical History:  Diagnosis Date   Depression    DM2 (diabetes mellitus, type 2) (HCC)    Dyslipidemia    Family history of brain cancer    Family history of pancreatic cancer    Hypertension    OA (osteoarthritis) of knee    Prostate cancer [facility])    prostate    SURGICAL HISTORY: Past Surgical History:  Procedure Laterality Date   BIOPSY  03/19/2022   Procedure: BIOPSY;  Surgeon: Rosalie Kitchens, MD;  Location: [facility];  Service: Gastroenterology;;   ORIN MEDIATE RELEASE Right 12/05/2023   Procedure: CARPAL TUNNEL RELEASE;  Surgeon: Romona Harari, MD;  Location: [facility];  Service: Orthopedics;  Laterality: Right;   ESOPHAGOGASTRODUODENOSCOPY (EGD) WITH PROPOFOL  N/A 03/19/2022   Procedure: ESOPHAGOGASTRODUODENOSCOPY (EGD) WITH PROPOFOL ;  Surgeon: Rosalie Kitchens, MD;  Location: [facility];  Service: Gastroenterology;  Laterality: N/A;   INGUINAL HERNIA REPAIR     PROSTATE BIOPSY     TOTAL KNEE ARTHROPLASTY Left 10/26/2020   Procedure: TOTAL KNEE ARTHROPLASTY;  Surgeon: Ernie Cough, MD;  Location: [facility];  Service: Orthopedics;  Laterality: Left;  70 mins   TOTAL KNEE ARTHROPLASTY Right 12/21/2020   Procedure: TOTAL KNEE ARTHROPLASTY;  Surgeon: Ernie Cough, MD;  Location: [facility];  Service: Orthopedics;  Laterality: Right;  70 mins    SOCIAL HISTORY: Social History   Socioeconomic History   Marital status: Married    Spouse name: Not on file    Number of children: Not on file   Years of education: Not on file   Highest education level: Not on file  Occupational History   Occupation: retired    Comment: Holiday representative  Tobacco Use   Smoking status: Former    Current packs/day: 0.00    Average packs/day: 2.0 packs/day for 12.0 years (24.0 ttl pk-yrs)    Types: Cigarettes    Start date: 04/19/1967    Quit date: 04/19/1979    Years since quitting: 45.1   Smokeless tobacco: Never  Vaping Use   Vaping status: Never Used  Substance and Sexual Activity   Alcohol use: Yes    Comment: occas.   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  Other Topics Concern   Not on file  Social History Narrative   Not on file   Social Drivers of Health   Financial Resource Strain: Not on file  Food Insecurity: Not on file  Transportation Needs: Not on file  Physical Activity: Not on file  Stress: Not on file  Social Connections: Not on file  Intimate Partner Violence: Not on file    FAMILY HISTORY: Family History  Problem Relation Age of Onset   CAD Mother    Pancreatic cancer Sister 63   Other Sister        brain tumor   Leukemia Sister    Celiac disease Sister    CAD Brother        CABGx4    CAD Brother        CABGx4   Lung cancer Maternal Grandfather    Lung cancer Paternal Grandfather    Cancer Maternal Aunt        unknown   Celiac disease Maternal Aunt    Cancer Maternal Uncle        unknown   Cancer Cousin        unknown   Spina bifida Niece    Colon cancer Neg Hx    Breast cancer Neg Hx     ALLERGIES:  has no known allergies.  MEDICATIONS:  Current Outpatient Medications  Medication Sig Dispense Refill   atorvastatin  (LIPITOR) 40 MG tablet Take 1 tablet (40 mg total) by mouth daily. 90 tablet 3   baclofen  (LIORESAL ) 10 MG tablet Take 10-20 mg by mouth 2 (two) times daily. Take 1 tablet (10 mg) in the morning and Take 2 tablets (20 mg) at bedtime      Blood Glucose Monitoring Suppl (TRUE METRIX AIR GLUCOSE METER) DEVI True Metrix Air Glucose Meter kit     Cholecalciferol (VITAMIN D3) 50 MCG (2000 UT) TABS Take 2,000 Units by mouth every morning.     Coenzyme Q10-Vitamin E (QUNOL ULTRA COQ10 PO) Take 1 capsule by mouth at bedtime.     DULoxetine  (CYMBALTA ) 60 MG capsule Take 60 mg by mouth every evening.     ferrous sulfate  325 (65 FE) MG tablet Take 325 mg by mouth 3 (three) times daily with meals.     glimepiride  (AMARYL ) 4 MG tablet Take 4 mg by mouth daily with breakfast.     glucose blood (TRUE METRIX BLOOD GLUCOSE TEST) test strip True Metrix Glucose Test Strip  levothyroxine  (SYNTHROID ) 100 MCG tablet Take 100 mcg by mouth daily before breakfast.     losartan  (COZAAR ) 25 MG tablet Take 12.5 mg by mouth every evening.     metFORMIN  (GLUCOPHAGE ) 1000 MG tablet Take 1,000 mg by mouth in the morning and at bedtime.     pantoprazole  (PROTONIX ) 40 MG tablet TAKE 1 TABLET BY MOUTH TWICE A DAY 60 tablet 0   primidone (MYSOLINE) 250 MG tablet Take 250 mg by mouth daily.     primidone (MYSOLINE) 50 MG tablet Increase up to 5 tabs qam as directed in addition to the 250mg  at bedtime     REPATHA  SURECLICK 140 MG/ML SOAJ INJECT 140 MG UNDER THE SKIN EVERY 2 WEEKS (14 DAYS) AS DIRECTED 6 mL 3   tamsulosin  (FLOMAX ) 0.4 MG CAPS capsule Take 0.8 mg by mouth in the morning.     TRUEplus Lancets 33G MISC TRUEplus Lancets 33 gauge     ALPRAZolam  (XANAX ) 1 MG tablet Take 1 mg by mouth at bedtime.     cyclobenzaprine (FLEXERIL) 5 MG tablet      pioglitazone  (ACTOS ) 15 MG tablet      sitaGLIPtin -metformin  (JANUMET) 50-1000 MG tablet Take by mouth.     sulfamethoxazole-trimethoprim (BACTRIM DS) 800-160 MG tablet Take 1 tablet by mouth 2 (two) times daily.     No current facility-administered medications for this visit.     PHYSICAL EXAMINATION: ECOG PERFORMANCE STATUS: 0 - Asymptomatic  Vitals:   06/02/24 1213  BP: (!) 154/63  Pulse: 86  Resp: 19   Temp: 98.5 F (36.9 C)  SpO2: 96%    Filed Weights   06/02/24 1213  Weight: 241 lb 9.6 oz (109.6 kg)     GENERAL:alert, no distress and comfortable SKIN: skin color, texture, turgor are normal, no rashes or significant lesions EYES: normal, conjunctiva are pink and non-injected, sclera clear OROPHARYNX:no exudate, no erythema and lips, buccal mucosa, and tongue normal  NECK: supple, thyroid  normal size, non-tender, without nodularity LYMPH:  no palpable lymphadenopathy in the cervical, axillary LUNGS: clear to auscultation and percussion with normal breathing effort HEART: regular rate & rhythm and no murmurs and no lower extremity edema ABDOMEN:abdomen soft, non-tender and normal bowel sounds Musculoskeletal:no cyanosis of digits and no clubbing  PSYCH: alert & oriented x 3 with fluent speech NEURO: no focal motor/sensory deficits  LABORATORY DATA:  I have reviewed the data as listed Lab Results  Component Value Date   WBC 4.5 12/03/2023   HGB 12.3 (L) 12/03/2023   HCT 35.2 (L) 12/03/2023   MCV 95.1 12/03/2023   PLT 250 12/03/2023     Chemistry      Component Value Date/Time   NA 135 12/04/2023 1300   NA 143 12/11/2019 1543   K 4.1 12/04/2023 1300   CL 104 12/04/2023 1300   CO2 21 (L) 12/04/2023 1300   BUN 17 12/04/2023 1300   BUN 17 12/11/2019 1543   CREATININE 0.97 12/04/2023 1300      Component Value Date/Time   CALCIUM  8.9 12/04/2023 1300   ALKPHOS 62 03/18/2022 0820   AST 33 03/18/2022 0820   ALT 7 03/18/2022 0820   BILITOT 1.7 (H) 03/18/2022 0820     Labs from today  pending  RADIOGRAPHIC STUDIES: I have personally reviewed the radiological images as listed and agreed with the findings in the report. No results found.  All questions were answered. The patient knows to call the clinic with any problems, questions or concerns. I spent  20 minutes in the care of this patient including H and P, review of records, counseling and coordination of  care.     Amber Stalls, MD 06/02/2024 12:19 PM Yes

## 2024-06-02 NOTE — Progress Notes (Signed)
 [facility] CONSULT NOTE  Patient Care Team: Brien Josette HERO, PA-C (Inactive) as PCP - General (Physician Assistant) [name], MD as PCP - Cardiology (Cardiology) Nieves Cough, MD as Consulting Physician (Urology) Patrcia Cough, MD as Consulting Physician (Radiation Oncology) Rosalie Kitchens, MD as Consulting Physician (Gastroenterology) Loretha Ash, MD as Consulting Physician (Hematology and Oncology)  CHIEF COMPLAINTS/PURPOSE OF CONSULTATION:  Anemia  ASSESSMENT & PLAN:   This is a very pleasant 78 year old male patient with past medical history significant for type 2 diabetes, hypertension, dyslipidemia, history of prostate cancer 2 years ago currently on surveillance referred to hematology for evaluation of severe anemia.   HISTORY OF PRESENTING ILLNESS:   The patient is a 78 year old with anemia who presents for follow-up.  Discussed the use of AI scribe software for clinical note transcription with the patient, who gave verbal consent to proceed.  History of Present Illness    MEDICAL HISTORY:  Past Medical History:  Diagnosis Date   Depression    DM2 (diabetes mellitus, type 2) (HCC)    Dyslipidemia    Family history of brain cancer    Family history of pancreatic cancer    Hypertension    OA (osteoarthritis) of knee    Prostate cancer [facility])    prostate    SURGICAL HISTORY: Past Surgical History:  Procedure Laterality Date   BIOPSY  03/19/2022   Procedure: BIOPSY;  Surgeon: Rosalie Kitchens, MD;  Location: [facility];  Service: Gastroenterology;;   ORIN MEDIATE RELEASE Right 12/05/2023   Procedure: CARPAL TUNNEL RELEASE;  Surgeon: Romona Harari, MD;  Location: [facility];  Service: Orthopedics;  Laterality: Right;   ESOPHAGOGASTRODUODENOSCOPY (EGD) WITH PROPOFOL  N/A 03/19/2022   Procedure: ESOPHAGOGASTRODUODENOSCOPY (EGD) WITH PROPOFOL ;  Surgeon: Rosalie Kitchens, MD;  Location: [facility];  Service: Gastroenterology;   Laterality: N/A;   INGUINAL HERNIA REPAIR     PROSTATE BIOPSY     TOTAL KNEE ARTHROPLASTY Left 10/26/2020   Procedure: TOTAL KNEE ARTHROPLASTY;  Surgeon: Ernie Cough, MD;  Location: [facility];  Service: Orthopedics;  Laterality: Left;  70 mins   TOTAL KNEE ARTHROPLASTY Right 12/21/2020   Procedure: TOTAL KNEE ARTHROPLASTY;  Surgeon: Ernie Cough, MD;  Location: [facility];  Service: Orthopedics;  Laterality: Right;  70 mins    SOCIAL HISTORY: Social History   Socioeconomic History   Marital status: Married    Spouse name: Not on file   Number of children: Not on file   Years of education: Not on file   Highest education level: Not on file  Occupational History   Occupation: retired    Comment: Holiday representative  Tobacco Use   Smoking status: Former    Current packs/day: 0.00    Average packs/day: 2.0 packs/day for 12.0 years (24.0 ttl pk-yrs)    Types: Cigarettes    Start date: 04/19/1967    Quit date: 04/19/1979    Years since quitting: 45.1   Smokeless tobacco: Never  Vaping Use   Vaping status: Never Used  Substance and Sexual Activity   Alcohol use: Yes    Comment: occas.   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  Other Topics Concern   Not on file  Social History Narrative   Not on file   Social Drivers of Health   Financial Resource Strain: Not on file  Food Insecurity: Not on file  Transportation Needs: Not on file  Physical  Activity: Not on file  Stress: Not on file  Social Connections: Not on file  Intimate Partner Violence: Not on file    FAMILY HISTORY: Family History  Problem Relation Age of Onset   CAD Mother    Pancreatic cancer Sister 96   Other Sister        brain tumor   Leukemia Sister    Celiac disease Sister    CAD Brother        CABGx4    CAD Brother        CABGx4   Lung cancer Maternal Grandfather    Lung cancer Paternal Grandfather    Cancer Maternal Aunt        unknown   Celiac  disease Maternal Aunt    Cancer Maternal Uncle        unknown   Cancer Cousin        unknown   Spina bifida Niece    Colon cancer Neg Hx    Breast cancer Neg Hx     ALLERGIES:  has no known allergies.  MEDICATIONS:  Current Outpatient Medications  Medication Sig Dispense Refill   atorvastatin  (LIPITOR) 40 MG tablet Take 1 tablet (40 mg total) by mouth daily. 90 tablet 3   baclofen  (LIORESAL ) 10 MG tablet Take 10-20 mg by mouth 2 (two) times daily. Take 1 tablet (10 mg) in the morning and Take 2 tablets (20 mg) at bedtime     Blood Glucose Monitoring Suppl (TRUE METRIX AIR GLUCOSE METER) DEVI True Metrix Air Glucose Meter kit     Cholecalciferol (VITAMIN D3) 50 MCG (2000 UT) TABS Take 2,000 Units by mouth every morning.     Coenzyme Q10-Vitamin E (QUNOL ULTRA COQ10 PO) Take 1 capsule by mouth at bedtime.     DULoxetine  (CYMBALTA ) 60 MG capsule Take 60 mg by mouth every evening.     ferrous sulfate  325 (65 FE) MG tablet Take 325 mg by mouth 3 (three) times daily with meals.     glimepiride  (AMARYL ) 4 MG tablet Take 4 mg by mouth daily with breakfast.     glucose blood (TRUE METRIX BLOOD GLUCOSE TEST) test strip True Metrix Glucose Test Strip     levothyroxine  (SYNTHROID ) 100 MCG tablet Take 100 mcg by mouth daily before breakfast.     losartan  (COZAAR ) 25 MG tablet Take 12.5 mg by mouth every evening.     metFORMIN  (GLUCOPHAGE ) 1000 MG tablet Take 1,000 mg by mouth in the morning and at bedtime.     pantoprazole  (PROTONIX ) 40 MG tablet TAKE 1 TABLET BY MOUTH TWICE A DAY 60 tablet 0   primidone (MYSOLINE) 250 MG tablet Take 250 mg by mouth daily.     primidone (MYSOLINE) 50 MG tablet Increase up to 5 tabs qam as directed in addition to the 250mg  at bedtime     REPATHA  SURECLICK 140 MG/ML SOAJ INJECT 140 MG UNDER THE SKIN EVERY 2 WEEKS (14 DAYS) AS DIRECTED 6 mL 3   tamsulosin  (FLOMAX ) 0.4 MG CAPS capsule Take 0.8 mg by mouth in the morning.     TRUEplus Lancets 33G MISC TRUEplus Lancets  33 gauge     ALPRAZolam  (XANAX ) 1 MG tablet Take 1 mg by mouth at bedtime.     cyclobenzaprine (FLEXERIL) 5 MG tablet      pioglitazone  (ACTOS ) 15 MG tablet      sitaGLIPtin -metformin  (JANUMET) 50-1000 MG tablet Take by mouth.     sulfamethoxazole-trimethoprim (BACTRIM DS) 800-160 MG tablet Take 1  tablet by mouth 2 (two) times daily.     No current facility-administered medications for this visit.     PHYSICAL EXAMINATION: ECOG PERFORMANCE STATUS: 0 - Asymptomatic  Vitals:   06/02/24 1213  BP: (!) 154/63  Pulse: 86  Resp: 19  Temp: 98.5 F (36.9 C)  SpO2: 96%    Filed Weights   06/02/24 1213  Weight: 241 lb 9.6 oz (109.6 kg)     GENERAL:alert, no distress and comfortable SKIN: skin color, texture, turgor are normal, no rashes or significant lesions EYES: normal, conjunctiva are pink and non-injected, sclera clear OROPHARYNX:no exudate, no erythema and lips, buccal mucosa, and tongue normal  NECK: supple, thyroid  normal size, non-tender, without nodularity LYMPH:  no palpable lymphadenopathy in the cervical, axillary LUNGS: clear to auscultation and percussion with normal breathing effort HEART: regular rate & rhythm and no murmurs and no lower extremity edema ABDOMEN:abdomen soft, non-tender and normal bowel sounds Musculoskeletal:no cyanosis of digits and no clubbing  PSYCH: alert & oriented x 3 with fluent speech NEURO: no focal motor/sensory deficits  LABORATORY DATA:  I have reviewed the data as listed Lab Results  Component Value Date   WBC 4.5 12/03/2023   HGB 12.3 (L) 12/03/2023   HCT 35.2 (L) 12/03/2023   MCV 95.1 12/03/2023   PLT 250 12/03/2023     Chemistry      Component Value Date/Time   NA 135 12/04/2023 1300   NA 143 12/11/2019 1543   K 4.1 12/04/2023 1300   CL 104 12/04/2023 1300   CO2 21 (L) 12/04/2023 1300   BUN 17 12/04/2023 1300   BUN 17 12/11/2019 1543   CREATININE 0.97 12/04/2023 1300      Component Value Date/Time   CALCIUM  8.9  12/04/2023 1300   ALKPHOS 62 03/18/2022 0820   AST 33 03/18/2022 0820   ALT 7 03/18/2022 0820   BILITOT 1.7 (H) 03/18/2022 0820       RADIOGRAPHIC STUDIES: I have personally reviewed the radiological images as listed and agreed with the findings in the report. No results found.  All questions were answered. The patient knows to call the clinic with any problems, questions or concerns. I spent 20 minutes in the care of this patient including H and P, review of records, counseling and coordination of care.     Amber Stalls, MD 06/02/2024 12:21 PM Yes

## 2024-06-12 DIAGNOSIS — M65831 Other synovitis and tenosynovitis, right forearm: Secondary | ICD-10-CM | POA: Diagnosis not present

## 2024-06-12 DIAGNOSIS — Z9889 Other specified postprocedural states: Secondary | ICD-10-CM | POA: Diagnosis not present

## 2024-06-19 DIAGNOSIS — M25641 Stiffness of right hand, not elsewhere classified: Secondary | ICD-10-CM | POA: Diagnosis not present

## 2024-06-19 DIAGNOSIS — M79641 Pain in right hand: Secondary | ICD-10-CM | POA: Diagnosis not present

## 2024-06-24 DIAGNOSIS — M79641 Pain in right hand: Secondary | ICD-10-CM | POA: Diagnosis not present

## 2024-07-01 DIAGNOSIS — M79641 Pain in right hand: Secondary | ICD-10-CM | POA: Diagnosis not present

## 2024-07-08 DIAGNOSIS — M79641 Pain in right hand: Secondary | ICD-10-CM | POA: Diagnosis not present

## 2024-07-15 DIAGNOSIS — M25641 Stiffness of right hand, not elsewhere classified: Secondary | ICD-10-CM | POA: Diagnosis not present

## 2024-07-15 DIAGNOSIS — M79641 Pain in right hand: Secondary | ICD-10-CM | POA: Diagnosis not present

## 2024-07-21 DIAGNOSIS — M79641 Pain in right hand: Secondary | ICD-10-CM | POA: Diagnosis not present

## 2024-07-22 DIAGNOSIS — G250 Essential tremor: Secondary | ICD-10-CM | POA: Diagnosis not present

## 2024-07-22 DIAGNOSIS — I10 Essential (primary) hypertension: Secondary | ICD-10-CM | POA: Diagnosis not present

## 2024-07-22 DIAGNOSIS — G894 Chronic pain syndrome: Secondary | ICD-10-CM | POA: Diagnosis not present

## 2024-07-22 DIAGNOSIS — D509 Iron deficiency anemia, unspecified: Secondary | ICD-10-CM | POA: Diagnosis not present

## 2024-07-22 DIAGNOSIS — E785 Hyperlipidemia, unspecified: Secondary | ICD-10-CM | POA: Diagnosis not present

## 2024-07-22 DIAGNOSIS — R197 Diarrhea, unspecified: Secondary | ICD-10-CM | POA: Diagnosis not present

## 2024-07-22 DIAGNOSIS — E559 Vitamin D deficiency, unspecified: Secondary | ICD-10-CM | POA: Diagnosis not present

## 2024-07-22 DIAGNOSIS — Z6835 Body mass index (BMI) 35.0-35.9, adult: Secondary | ICD-10-CM | POA: Diagnosis not present

## 2024-07-22 DIAGNOSIS — E1140 Type 2 diabetes mellitus with diabetic neuropathy, unspecified: Secondary | ICD-10-CM | POA: Diagnosis not present

## 2024-07-23 DIAGNOSIS — Z823 Family history of stroke: Secondary | ICD-10-CM | POA: Diagnosis not present

## 2024-07-23 DIAGNOSIS — E11319 Type 2 diabetes mellitus with unspecified diabetic retinopathy without macular edema: Secondary | ICD-10-CM | POA: Diagnosis not present

## 2024-07-23 DIAGNOSIS — M62838 Other muscle spasm: Secondary | ICD-10-CM | POA: Diagnosis not present

## 2024-07-23 DIAGNOSIS — Z7984 Long term (current) use of oral hypoglycemic drugs: Secondary | ICD-10-CM | POA: Diagnosis not present

## 2024-07-23 DIAGNOSIS — E1142 Type 2 diabetes mellitus with diabetic polyneuropathy: Secondary | ICD-10-CM | POA: Diagnosis not present

## 2024-07-23 DIAGNOSIS — Z809 Family history of malignant neoplasm, unspecified: Secondary | ICD-10-CM | POA: Diagnosis not present

## 2024-07-23 DIAGNOSIS — I700 Atherosclerosis of aorta: Secondary | ICD-10-CM | POA: Diagnosis not present

## 2024-07-23 DIAGNOSIS — E039 Hypothyroidism, unspecified: Secondary | ICD-10-CM | POA: Diagnosis not present

## 2024-07-23 DIAGNOSIS — E785 Hyperlipidemia, unspecified: Secondary | ICD-10-CM | POA: Diagnosis not present

## 2024-07-23 DIAGNOSIS — G250 Essential tremor: Secondary | ICD-10-CM | POA: Diagnosis not present

## 2024-07-23 DIAGNOSIS — I2510 Atherosclerotic heart disease of native coronary artery without angina pectoris: Secondary | ICD-10-CM | POA: Diagnosis not present

## 2024-07-23 DIAGNOSIS — F411 Generalized anxiety disorder: Secondary | ICD-10-CM | POA: Diagnosis not present

## 2024-07-23 DIAGNOSIS — I10 Essential (primary) hypertension: Secondary | ICD-10-CM | POA: Diagnosis not present

## 2024-07-23 DIAGNOSIS — F329 Major depressive disorder, single episode, unspecified: Secondary | ICD-10-CM | POA: Diagnosis not present

## 2024-07-23 DIAGNOSIS — K219 Gastro-esophageal reflux disease without esophagitis: Secondary | ICD-10-CM | POA: Diagnosis not present

## 2024-07-23 DIAGNOSIS — N400 Enlarged prostate without lower urinary tract symptoms: Secondary | ICD-10-CM | POA: Diagnosis not present

## 2024-07-23 DIAGNOSIS — R32 Unspecified urinary incontinence: Secondary | ICD-10-CM | POA: Diagnosis not present

## 2024-07-23 DIAGNOSIS — Z9181 History of falling: Secondary | ICD-10-CM | POA: Diagnosis not present

## 2024-07-28 DIAGNOSIS — M79641 Pain in right hand: Secondary | ICD-10-CM | POA: Diagnosis not present

## 2024-07-29 DIAGNOSIS — C61 Malignant neoplasm of prostate: Secondary | ICD-10-CM | POA: Diagnosis not present

## 2024-07-29 DIAGNOSIS — N3941 Urge incontinence: Secondary | ICD-10-CM | POA: Diagnosis not present

## 2024-08-13 DIAGNOSIS — M25641 Stiffness of right hand, not elsewhere classified: Secondary | ICD-10-CM | POA: Diagnosis not present

## 2024-08-13 DIAGNOSIS — M79641 Pain in right hand: Secondary | ICD-10-CM | POA: Diagnosis not present

## 2024-09-18 ENCOUNTER — Other Ambulatory Visit: Payer: Self-pay

## 2024-09-18 ENCOUNTER — Emergency Department (HOSPITAL_COMMUNITY)
Admission: EM | Admit: 2024-09-18 | Discharge: 2024-09-18 | Disposition: A | Discharge status: Home / Self Care | Attending: Emergency Medicine | Admitting: Emergency Medicine

## 2024-09-18 ENCOUNTER — Emergency Department (HOSPITAL_COMMUNITY)

## 2024-09-18 ENCOUNTER — Encounter (HOSPITAL_COMMUNITY): Payer: Self-pay

## 2024-09-18 DIAGNOSIS — R197 Diarrhea, unspecified: Secondary | ICD-10-CM | POA: Diagnosis not present

## 2024-09-18 DIAGNOSIS — R1032 Left lower quadrant pain: Secondary | ICD-10-CM | POA: Insufficient documentation

## 2024-09-18 DIAGNOSIS — J189 Pneumonia, unspecified organism: Secondary | ICD-10-CM

## 2024-09-18 DIAGNOSIS — J181 Lobar pneumonia, unspecified organism: Secondary | ICD-10-CM | POA: Insufficient documentation

## 2024-09-18 DIAGNOSIS — I10 Essential (primary) hypertension: Secondary | ICD-10-CM | POA: Diagnosis not present

## 2024-09-18 DIAGNOSIS — R0602 Shortness of breath: Secondary | ICD-10-CM | POA: Diagnosis present

## 2024-09-18 DIAGNOSIS — Z79899 Other long term (current) drug therapy: Secondary | ICD-10-CM | POA: Diagnosis not present

## 2024-09-18 DIAGNOSIS — E1165 Type 2 diabetes mellitus with hyperglycemia: Secondary | ICD-10-CM | POA: Diagnosis not present

## 2024-09-18 DIAGNOSIS — R739 Hyperglycemia, unspecified: Secondary | ICD-10-CM

## 2024-09-18 LAB — CBC
HCT: 40 % (ref 39.0–52.0)
Hemoglobin: 14.1 g/dL (ref 13.0–17.0)
MCH: 34 pg (ref 26.0–34.0)
MCHC: 35.3 g/dL (ref 30.0–36.0)
MCV: 96.4 fL (ref 80.0–100.0)
Platelets: 278 K/uL (ref 150–400)
RBC: 4.15 MIL/uL — ABNORMAL LOW (ref 4.22–5.81)
RDW: 12.8 % (ref 11.5–15.5)
WBC: 7.1 K/uL (ref 4.0–10.5)
nRBC: 0 % (ref 0.0–0.2)

## 2024-09-18 LAB — COMPREHENSIVE METABOLIC PANEL WITH GFR
ALT: 6 U/L (ref 0–44)
AST: 16 U/L (ref 15–41)
Albumin: 3.9 g/dL (ref 3.5–5.0)
Alkaline Phosphatase: 134 U/L — ABNORMAL HIGH (ref 38–126)
Anion gap: 17 — ABNORMAL HIGH (ref 5–15)
BUN: 16 mg/dL (ref 8–23)
CO2: 21 mmol/L — ABNORMAL LOW (ref 22–32)
Calcium: 9.5 mg/dL (ref 8.9–10.3)
Chloride: 94 mmol/L — ABNORMAL LOW (ref 98–111)
Creatinine, Ser: 0.92 mg/dL (ref 0.61–1.24)
GFR, Estimated: >60 mL/min
Glucose, Bld: 612 mg/dL (ref 70–99)
Potassium: 4.6 mmol/L (ref 3.5–5.1)
Sodium: 132 mmol/L — ABNORMAL LOW (ref 135–145)
Total Bilirubin: 0.3 mg/dL (ref 0.0–1.2)
Total Protein: 7.6 g/dL (ref 6.5–8.1)

## 2024-09-18 LAB — URINALYSIS, ROUTINE W REFLEX MICROSCOPIC
Bacteria, UA: NONE SEEN
Bilirubin Urine: NEGATIVE
Glucose, UA: >=500 mg/dL — AB
Hgb urine dipstick: NEGATIVE
Ketones, ur: 20 mg/dL — AB
Leukocytes,Ua: NEGATIVE
Nitrite: NEGATIVE
Protein, ur: NEGATIVE mg/dL
Specific Gravity, Urine: 1.026 (ref 1.005–1.030)
pH: 6 (ref 5.0–8.0)

## 2024-09-18 LAB — LIPASE, BLOOD: Lipase: 29 U/L (ref 11–51)

## 2024-09-18 LAB — CBG MONITORING, ED: Glucose-Capillary: 458 mg/dL — ABNORMAL HIGH (ref 70–99)

## 2024-09-18 LAB — BETA-HYDROXYBUTYRIC ACID: Beta-Hydroxybutyric Acid: 1.8 mmol/L — ABNORMAL HIGH (ref 0.05–0.27)

## 2024-09-18 MED ORDER — AMOXICILLIN-POT CLAVULANATE 875-125 MG PO TABS: 1.0000 | ORAL_TABLET | Freq: Two times a day (BID) | ORAL | 0 refills | Status: DC

## 2024-09-18 MED ORDER — SODIUM CHLORIDE 0.9 % IV SOLN
1.0000 g | Freq: Once | INTRAVENOUS | Status: AC
  Administered 2024-09-18: 1 g via INTRAVENOUS
  Filled 2024-09-18: qty 10

## 2024-09-18 MED ORDER — AZITHROMYCIN 250 MG PO TABS: 250.0000 mg | ORAL_TABLET | Freq: Every day | ORAL | 0 refills | Status: DC

## 2024-09-18 MED ORDER — LACTATED RINGERS IV BOLUS
1000.0000 mL | Freq: Once | INTRAVENOUS | Status: AC
  Administered 2024-09-18: 1000 mL via INTRAVENOUS

## 2024-09-18 MED ORDER — IOHEXOL 300 MG/ML  SOLN
100.0000 mL | Freq: Once | INTRAMUSCULAR | Status: AC | PRN
  Administered 2024-09-18: 100 mL via INTRAVENOUS

## 2024-09-18 MED ORDER — AZITHROMYCIN 250 MG PO TABS
500.0000 mg | ORAL_TABLET | Freq: Once | ORAL | Status: AC
  Administered 2024-09-18: 500 mg via ORAL
  Filled 2024-09-18: qty 2

## 2024-09-18 MED ORDER — PIOGLITAZONE HCL 15 MG PO TABS: 15.0000 mg | ORAL_TABLET | Freq: Every day | ORAL | 0 refills | Status: DC

## 2024-09-18 MED ORDER — SENNOSIDES-DOCUSATE SODIUM 8.6-50 MG PO TABS: 1.0000 | ORAL_TABLET | Freq: Every day | ORAL | 0 refills | Status: DC

## 2024-09-18 MED ORDER — OXYCODONE HCL 5 MG PO TABS: 5.0000 mg | ORAL_TABLET | Freq: Four times a day (QID) | ORAL | 0 refills | Status: DC | PRN

## 2024-09-18 MED ORDER — INSULIN ASPART 100 UNIT/ML IJ SOLN
5.0000 [IU] | Freq: Once | INTRAMUSCULAR | Status: AC
  Administered 2024-09-18: 5 [IU] via INTRAVENOUS
  Filled 2024-09-18: qty 5

## 2024-09-18 MED ORDER — OXYCODONE-ACETAMINOPHEN 5-325 MG PO TABS
1.0000 | ORAL_TABLET | Freq: Once | ORAL | Status: AC
  Administered 2024-09-18: 1 via ORAL
  Filled 2024-09-18: qty 1

## 2024-09-18 NOTE — ED Triage Notes (Signed)
 LLQ abdominal pains that have been worsening over the past 2-3 days.

## 2024-09-18 NOTE — Discharge Instructions (Addendum)
 You were seen in the emergency department for your left-sided chest and abdominal pain.  Your workup shows that you have a pneumonia and I given you a course of antibiotics you should complete this as prescribed.  Your blood sugar was also significantly elevated in the emergency department.  We gave you fluids and insulin  to improve your blood sugar.  I have given you a refill of your previous diabetes medication and you should take this until you receive your new prescription.  You should also complete your antibiotics as prescribed.  You can take Tylenol  every 6 hours as needed for pain and I have given you oxycodone  for breakthrough pain.  This can make you constipated so I have given you a stool softener to take while on this and it can make you drowsy so do not drive, work or operate heavy machinery while on this.  You should follow-up with your primary doctor in the next few days to have your symptoms and blood sugar rechecked.  You should return to the emergency department for worsening shortness of breath, uncontrollable pain, fevers despite the antibiotics or any other new or concerning symptoms.

## 2024-09-18 NOTE — ED Provider Notes (Signed)
 " [place] EMERGENCY DEPARTMENT AT [facility] Provider Note   CSN: [id number] Arrival date & time: 09/18/24  9563     Patient presents with: Abdominal Pain   Kevin Olson is a 79 y.o. male.   Patient is a 79 year old male with past medical history of diabetes, hypertension presenting to the emergency department with left lower quadrant pain.  Patient reports for the last 2 days he has had pain in the left lower quadrant that radiates into his left lower chest.  He states that the pain is constant but is worse when he takes a deep breath and endorses some mild shortness of breath.  He denies any fever, nausea or vomiting.  He states that he has had diarrhea and has had to wear a depends because he cannot make it to the bathroom.  He also reports urinary frequency but denies any dysuria.  He denies any significant cough, congestion or sore throat.  The history is provided by the patient and the spouse.  Abdominal Pain      Prior to Admission medications  Medication Sig Start Date End Date Taking? Authorizing Provider  amoxicillin -clavulanate (AUGMENTIN ) 875-125 MG tablet Take 1 tablet by mouth every 12 (twelve) hours for 5 days. 09/18/24 09/23/24 Yes [name], Eligah Anello K, DO  azithromycin (ZITHROMAX) 250 MG tablet Take 1 tablet (250 mg total) by mouth daily. Take 1 every day until finished. 09/18/24  Yes [name], Zaul Hubers K, DO  oxyCODONE  (ROXICODONE ) 5 MG immediate release tablet Take 1 tablet (5 mg total) by mouth every 6 (six) hours as needed. 09/18/24  Yes [name], Kourtnee Lahey K, DO  senna-docusate (SENOKOT-S) 8.6-50 MG tablet Take 1 tablet by mouth daily. 09/18/24  Yes [name], Marialena Wollen K, DO  ALPRAZolam  (XANAX ) 1 MG tablet Take 1 mg by mouth at bedtime. 08/21/17   [provider]  atorvastatin  (LIPITOR) 40 MG tablet Take 1 tablet (40 mg total) by mouth daily. 01/30/24   Hilty, Vinie BROCKS, MD  baclofen (LIORESAL) 10 MG tablet Take 10-20 mg by mouth 2 (two) times daily. Take 1  tablet (10 mg) in the morning and Take 2 tablets (20 mg) at bedtime 08/27/17   [provider]  Blood Glucose Monitoring Suppl (TRUE METRIX AIR GLUCOSE METER) DEVI True Metrix Air Glucose Meter kit    [provider]  Cholecalciferol (VITAMIN D3) 50 MCG (2000 UT) TABS Take 2,000 Units by mouth every morning.    [provider]  Coenzyme Q10-Vitamin E (QUNOL ULTRA COQ10 PO) Take 1 capsule by mouth at bedtime.    [provider]  cyclobenzaprine (FLEXERIL) 5 MG tablet  02/10/14   [provider]  DULoxetine  (CYMBALTA ) 60 MG capsule Take 60 mg by mouth every evening. 08/14/17   [provider]  ferrous sulfate  325 (65 FE) MG tablet Take 325 mg by mouth 3 (three) times daily with meals.    [provider]  glimepiride (AMARYL) 4 MG tablet Take 4 mg by mouth daily with breakfast. 08/14/17   [provider]  glucose blood (TRUE METRIX BLOOD GLUCOSE TEST) test strip True Metrix Glucose Test Strip    [provider]  levothyroxine  (SYNTHROID ) 100 MCG tablet Take 100 mcg by mouth daily before breakfast. 05/22/19   [provider]  losartan (COZAAR) 25 MG tablet Take 12.5 mg by mouth every evening. 12/24/13   [provider]  metFORMIN (GLUCOPHAGE) 1000 MG tablet Take 1,000 mg by mouth in the morning and at bedtime.    [provider]  pantoprazole  (PROTONIX ) 40 MG tablet TAKE 1 TABLET BY MOUTH TWICE A DAY 04/10/22   Wendling, Mabel Mt, DO  pioglitazone (ACTOS) 15 MG tablet Take 1 tablet (15 mg total) by mouth daily. 09/18/24   [name], Everton Bertha K, DO  primidone (MYSOLINE) 250 MG tablet Take 250 mg by mouth daily.    [provider]  primidone (MYSOLINE) 50 MG tablet Increase up to 5 tabs qam as directed in addition to the 250mg  at bedtime 12/15/22   [provider]  REPATHA  SURECLICK 140 MG/ML SOAJ INJECT 140 MG UNDER THE SKIN EVERY 2 WEEKS (14 DAYS) AS DIRECTED 03/14/24   Hilty, Vinie BROCKS, MD  sitaGLIPtin-metformin (JANUMET) 50-1000 MG tablet Take by mouth. 09/08/20   [provider]  sulfamethoxazole-trimethoprim (BACTRIM DS) 800-160 MG tablet Take 1 tablet by mouth 2 (two) times daily. 08/03/22   [provider]  tamsulosin  (FLOMAX ) 0.4 MG CAPS capsule Take 0.8 mg by mouth in the morning.    [provider]  TRUEplus Lancets 33G MISC TRUEplus Lancets 33 gauge    [provider]    Allergies: Patient has no known allergies.    Review of Systems  Gastrointestinal:  Positive for abdominal pain.    Updated Vital Signs BP 126/65   Pulse 76   Temp 98.4 F (36.9 C) (Oral)   Resp 17   Ht 5' 10 (1.778 m)   Wt 108.9 kg   SpO2 91%   BMI 34.44 kg/m   Physical Exam Vitals and nursing note reviewed.  Constitutional:      General: He is not in acute distress.    Appearance: He is well-developed.  HENT:     Head: Normocephalic and atraumatic.     Mouth/Throat:     Mouth: Mucous membranes are moist.  Eyes:     Extraocular Movements: Extraocular movements intact.  Cardiovascular:     Rate and Rhythm: Normal rate and regular rhythm.     Heart sounds: Normal heart sounds.  Pulmonary:     Effort: Pulmonary effort is normal.     Breath sounds: Normal breath sounds.  Abdominal:     General: Abdomen is flat.     Palpations: Abdomen is soft.     Tenderness: There is abdominal tenderness in the left lower quadrant. There is no guarding or rebound.  Skin:    General: Skin is warm and dry.  Neurological:     General: No focal deficit present.     Mental Status: He is alert and oriented to person, place, and time.  Psychiatric:        Mood and Affect: Mood normal.        Behavior: Behavior normal.     (all labs ordered are listed, but only abnormal results are displayed) Labs Reviewed  CBC - Abnormal; Notable for the following components:      Result Value   RBC 4.15 (*)    All other components within normal limits   URINALYSIS, ROUTINE W REFLEX MICROSCOPIC - Abnormal; Notable for the following components:   Color, Urine STRAW (*)    Glucose, UA >=500 (*)    Ketones, ur 20 (*)    All other components within normal limits  COMPREHENSIVE METABOLIC PANEL WITH GFR - Abnormal; Notable for the following components:   Sodium 132 (*)    Chloride 94 (*)    CO2 21 (*)    Glucose, Bld 612 (*)    Alkaline Phosphatase 134 (*)  Anion gap 17 (*)    All other components within normal limits  BETA-HYDROXYBUTYRIC ACID - Abnormal; Notable for the following components:   Beta-Hydroxybutyric Acid 1.80 (*)    All other components within normal limits  CBG MONITORING, ED - Abnormal; Notable for the following components:   Glucose-Capillary 458 (*)    All other components within normal limits  LIPASE, BLOOD    EKG: EKG Interpretation Date/Time:  Thursday September 18 2024 08:09:36 EST Ventricular Rate:  78 PR Interval:  192 QRS Duration:  101 QT Interval:  334 QTC Calculation: 378 R Axis:   35  Text Interpretation: Sinus rhythm Baseline wander in lead(s) V1 Interpretation limited secondary to artifact Confirmed by [name] Fine ([id number]) on 09/18/2024 8:17:24 AM  Radiology: CT ABDOMEN PELVIS W CONTRAST Result Date: 09/18/2024 EXAM: CT ABDOMEN AND PELVIS WITH CONTRAST 09/18/2024 09:42:21 AM TECHNIQUE: CT of the abdomen and pelvis was performed with the administration of 100 mL of iohexol  (OMNIPAQUE ) 300 MG/ML solution. Multiplanar reformatted images are provided for review. Automated exposure control, iterative reconstruction, and/or weight-based adjustment of the mA/kV was utilized to reduce the radiation dose to as low as reasonably achievable. COMPARISON: 07/03/2025 CLINICAL HISTORY: LLQ abdominal pain FINDINGS: LOWER CHEST: Mild left lower lobe airspace opacity left lower lobe airspace disease is present. LIVER: Fatty infiltration of liver is present. No discrete lesions are present. GALLBLADDER AND BILE DUCTS:  Gallbladder is unremarkable. No biliary ductal dilatation. SPLEEN: No acute abnormality. PANCREAS: Chronic fatty infiltration of the pancreas is stable from prior exam. ADRENAL GLANDS: No acute abnormality. KIDNEYS, URETERS AND BLADDER: A simple cyst at the upper pole of the right kidney has increased in size, now measuring 4.9 cm. A simple cyst in the medial aspect of the right kidney measures 2.1 cm. Per consensus, no follow-up is needed for simple Bosniak type 1 and 2 renal cysts, unless the patient has a malignancy history or risk factors. No stones in the kidneys or ureters. No hydronephrosis. No perinephric or periureteral stranding. Urinary bladder is unremarkable. GI AND BOWEL: Stomach demonstrates no acute abnormality. Diverticular changes are present in the distal descending and sigmoid colon without focal inflammation to suggest diverticulitis. There is no bowel obstruction. PERITONEUM AND RETROPERITONEUM: No ascites. No free air. VASCULATURE: Atherosclerotic calcifications are present in the aorta and branch vessels without aneurysm. LYMPH NODES: No lymphadenopathy. REPRODUCTIVE ORGANS: Calculi are present within the prostate. BONES AND SOFT TISSUES: Grade 1 anterolisthesis and uncovertebral broad-based disc protrusion is present at L4-5 with mild central and bilateral foraminal narrowing. A vacuum disc is present at L5-S1. No acute osseous abnormality. No focal soft tissue abnormality. IMPRESSION: 1. No acute findings in the abdomen or pelvis, specifically no evidence of diverticulitis. 2. Mild left lower lobe airspace opacity may represent early infection. . Electronically signed by: Lonni Necessary MD 09/18/2024 10:46 AM EST RP Workstation: [id number]   DG Chest 2 View Result Date: 09/18/2024 EXAM: 2 VIEW(S) XRAY OF THE CHEST 09/18/2024 07:54:00 AM COMPARISON: 11/22/2021. CLINICAL HISTORY: shortness of breath FINDINGS: LUNGS AND PLEURA: Left basilar opacity, may represent atelectasis or  pneumonia. No pleural effusion. No pneumothorax. HEART AND MEDIASTINUM: Aortic calcification. BONES AND SOFT TISSUES: No acute osseous abnormality. IMPRESSION: 1. Left basilar opacity, which may represent atelectasis or pneumonia. Electronically signed by: Waddell Calk MD 09/18/2024 08:12 AM EST RP Workstation: [id number]     Procedures   Medications Ordered in the ED  lactated ringers  bolus 1,000 mL (0 mLs Intravenous Stopped 09/18/24 1211)  iohexol  (OMNIPAQUE ) 300 MG/ML  solution 100 mL (100 mLs Intravenous Contrast Given 09/18/24 0929)  insulin  aspart (novoLOG ) injection 5 Units (5 Units Intravenous Given 09/18/24 1143)  cefTRIAXone (ROCEPHIN) 1 g in sodium chloride  0.9 % 100 mL IVPB (0 g Intravenous Stopped 09/18/24 1213)  azithromycin (ZITHROMAX) tablet 500 mg (500 mg Oral Given 09/18/24 1143)  oxyCODONE -acetaminophen  (PERCOCET/ROXICET) 5-325 MG per tablet 1 tablet (1 tablet Oral Given 09/18/24 1216)    Clinical Course as of 09/18/24 1301  Thu Sep 18, 2024  0815 CXR with LLL opacity - atelectasis vs pneumonia.  [VK]  0918 Glucose 612, AGAP 17 and some ketones in the urine. Will add on BHB and start IVF. [VK]  1026 Minimally elevated BHB making DKA unlikely. Will order insulin  and repeat glucose after insulin  and fluids. [VK]  1058 CTAP without acute findings. Will treat for pneumonia. Family reports he has not been taking medication for his diabetes. PCP recently stopped metformin and pioglitazone due to side effects and ordered a new medication but due to insurance issues are unable to fill until 1/15. They are agreeable to restarting the pioglizaone until he is able to fill the new script.  [VK]  1225 Glucose downtrending. Patient is stable for discharge home with outpatient follow up. [VK]    Clinical Course User Index [VK] [name], Ilena Dieckman K, DO                                 Medical Decision Making This patient presents to the ED with chief complaint(s) of abdominal pain with pertinent  past medical history of hypertension, diabetes, anemia which further complicates the presenting complaint. The complaint involves an extensive differential diagnosis and also carries with it a high risk of complications and morbidity.    The differential diagnosis includes diverticulitis, gastroenteritis, dehydration, electrolyte abnormality, pneumonia, pneumothorax, pulmonary edema, pleural effusion, pyelonephritis, nephrolithiasis, other intra-abdominal infection  Additional history obtained: Additional history obtained from spouse Records reviewed outpatient heme-onc records, PCP records  ED Course and Reassessment: On patient's arrival he is hemodynamically stable in no acute distress.  He had labs initiated in triage, CBC within normal range, UA with glucosuria but no signs of infection.  Remainder of labs pending.  Patient will additionally have CT abdomen and pelvis, chest x-ray and EKG to evaluate for etiology of symptoms.  He did comment any pain control at this time and will be closely reassessed.  Independent labs interpretation:  The following labs were independently interpreted: hyperglycemia without DKA  Independent visualization of imaging: - I independently visualized the following imaging with scope of interpretation limited to determining acute life threatening conditions related to emergency care: CXR, CTAP, which revealed LLL pneumonia  Consultation: - Consulted or discussed management/test interpretation w/ external professional: N/A  Consideration for admission or further workup: Patient has no emergent conditions requiring admission or further work-up at this time and is stable for discharge home with primary care follow-up  Social Determinants of health: N/A    Amount and/or Complexity of Data Reviewed Labs: ordered. Radiology: ordered.  Risk OTC drugs. Prescription drug management.       Final diagnoses:  Hyperglycemia  Pneumonia of left lower lobe due  to infectious organism    ED Discharge Orders          Ordered    pioglitazone (ACTOS) 15 MG tablet  Daily        09/18/24 1145    oxyCODONE  (ROXICODONE ) 5 MG immediate  release tablet  Every 6 hours PRN        09/18/24 1259    senna-docusate (SENOKOT-S) 8.6-50 MG tablet  Daily        09/18/24 1259    amoxicillin -clavulanate (AUGMENTIN ) 875-125 MG tablet  Every 12 hours        09/18/24 1259    azithromycin (ZITHROMAX) 250 MG tablet  Daily        09/18/24 1259               [name], Cassi Jenne K, DO 09/18/24 1301  "

## 2024-09-22 ENCOUNTER — Inpatient Hospital Stay (HOSPITAL_COMMUNITY)

## 2024-09-22 ENCOUNTER — Encounter (HOSPITAL_COMMUNITY): Payer: Self-pay | Admitting: Family Medicine

## 2024-09-22 ENCOUNTER — Emergency Department (HOSPITAL_COMMUNITY)

## 2024-09-22 ENCOUNTER — Inpatient Hospital Stay (HOSPITAL_COMMUNITY)
Admission: EM | Admit: 2024-09-22 | Discharge: 2024-10-03 | DRG: 637 | Disposition: A | Discharge status: Skilled Nursing Facility | Attending: Internal Medicine | Admitting: Internal Medicine

## 2024-09-22 DIAGNOSIS — R32 Unspecified urinary incontinence: Secondary | ICD-10-CM | POA: Diagnosis present

## 2024-09-22 DIAGNOSIS — Z87891 Personal history of nicotine dependence: Secondary | ICD-10-CM

## 2024-09-22 DIAGNOSIS — D75839 Thrombocytosis, unspecified: Secondary | ICD-10-CM | POA: Diagnosis present

## 2024-09-22 DIAGNOSIS — F32A Depression, unspecified: Secondary | ICD-10-CM | POA: Diagnosis present

## 2024-09-22 DIAGNOSIS — E876 Hypokalemia: Secondary | ICD-10-CM | POA: Diagnosis present

## 2024-09-22 DIAGNOSIS — N179 Acute kidney failure, unspecified: Secondary | ICD-10-CM | POA: Diagnosis present

## 2024-09-22 DIAGNOSIS — Z79899 Other long term (current) drug therapy: Secondary | ICD-10-CM

## 2024-09-22 DIAGNOSIS — J189 Pneumonia, unspecified organism: Secondary | ICD-10-CM | POA: Diagnosis present

## 2024-09-22 DIAGNOSIS — R159 Full incontinence of feces: Secondary | ICD-10-CM | POA: Diagnosis present

## 2024-09-22 DIAGNOSIS — R339 Retention of urine, unspecified: Secondary | ICD-10-CM | POA: Diagnosis present

## 2024-09-22 DIAGNOSIS — E039 Hypothyroidism, unspecified: Secondary | ICD-10-CM | POA: Diagnosis present

## 2024-09-22 DIAGNOSIS — Z1152 Encounter for screening for COVID-19: Secondary | ICD-10-CM | POA: Diagnosis not present

## 2024-09-22 DIAGNOSIS — E1142 Type 2 diabetes mellitus with diabetic polyneuropathy: Secondary | ICD-10-CM | POA: Diagnosis present

## 2024-09-22 DIAGNOSIS — I119 Hypertensive heart disease without heart failure: Secondary | ICD-10-CM | POA: Diagnosis present

## 2024-09-22 DIAGNOSIS — Z79890 Hormone replacement therapy (postmenopausal): Secondary | ICD-10-CM

## 2024-09-22 DIAGNOSIS — Z66 Do not resuscitate: Secondary | ICD-10-CM | POA: Diagnosis present

## 2024-09-22 DIAGNOSIS — R54 Age-related physical debility: Secondary | ICD-10-CM | POA: Diagnosis present

## 2024-09-22 DIAGNOSIS — Z6834 Body mass index (BMI) 34.0-34.9, adult: Secondary | ICD-10-CM

## 2024-09-22 DIAGNOSIS — R338 Other retention of urine: Secondary | ICD-10-CM | POA: Diagnosis present

## 2024-09-22 DIAGNOSIS — I700 Atherosclerosis of aorta: Secondary | ICD-10-CM | POA: Diagnosis present

## 2024-09-22 DIAGNOSIS — Z8249 Family history of ischemic heart disease and other diseases of the circulatory system: Secondary | ICD-10-CM

## 2024-09-22 DIAGNOSIS — D509 Iron deficiency anemia, unspecified: Secondary | ICD-10-CM | POA: Diagnosis present

## 2024-09-22 DIAGNOSIS — J869 Pyothorax without fistula: Secondary | ICD-10-CM | POA: Diagnosis present

## 2024-09-22 DIAGNOSIS — C61 Malignant neoplasm of prostate: Secondary | ICD-10-CM | POA: Diagnosis present

## 2024-09-22 DIAGNOSIS — W19XXXA Unspecified fall, initial encounter: Secondary | ICD-10-CM | POA: Diagnosis present

## 2024-09-22 DIAGNOSIS — R410 Disorientation, unspecified: Secondary | ICD-10-CM

## 2024-09-22 DIAGNOSIS — Z96653 Presence of artificial knee joint, bilateral: Secondary | ICD-10-CM | POA: Diagnosis present

## 2024-09-22 DIAGNOSIS — E785 Hyperlipidemia, unspecified: Secondary | ICD-10-CM | POA: Diagnosis present

## 2024-09-22 DIAGNOSIS — R739 Hyperglycemia, unspecified: Principal | ICD-10-CM

## 2024-09-22 DIAGNOSIS — E66812 Obesity, class 2: Secondary | ICD-10-CM | POA: Diagnosis present

## 2024-09-22 DIAGNOSIS — N401 Enlarged prostate with lower urinary tract symptoms: Secondary | ICD-10-CM | POA: Diagnosis present

## 2024-09-22 DIAGNOSIS — Z8042 Family history of malignant neoplasm of prostate: Secondary | ICD-10-CM

## 2024-09-22 DIAGNOSIS — E119 Type 2 diabetes mellitus without complications: Secondary | ICD-10-CM | POA: Diagnosis not present

## 2024-09-22 DIAGNOSIS — Z7984 Long term (current) use of oral hypoglycemic drugs: Secondary | ICD-10-CM

## 2024-09-22 DIAGNOSIS — E1110 Type 2 diabetes mellitus with ketoacidosis without coma: Principal | ICD-10-CM | POA: Diagnosis present

## 2024-09-22 DIAGNOSIS — Z8546 Personal history of malignant neoplasm of prostate: Secondary | ICD-10-CM

## 2024-09-22 DIAGNOSIS — G9341 Metabolic encephalopathy: Secondary | ICD-10-CM | POA: Diagnosis present

## 2024-09-22 DIAGNOSIS — J90 Pleural effusion, not elsewhere classified: Secondary | ICD-10-CM | POA: Diagnosis present

## 2024-09-22 DIAGNOSIS — Z9889 Other specified postprocedural states: Secondary | ICD-10-CM | POA: Diagnosis not present

## 2024-09-22 HISTORY — PX: IR THORACENTESIS ASP PLEURAL SPACE W/IMG GUIDE: IMG5380

## 2024-09-22 LAB — CBC WITH DIFFERENTIAL/PLATELET
Abs Immature Granulocytes: 0.05 K/uL (ref 0.00–0.07)
Basophils Absolute: 0 K/uL (ref 0.0–0.1)
Basophils Relative: 0 %
Eosinophils Absolute: 0 K/uL (ref 0.0–0.5)
Eosinophils Relative: 0 %
HCT: 38.2 % — ABNORMAL LOW (ref 39.0–52.0)
Hemoglobin: 12.8 g/dL — ABNORMAL LOW (ref 13.0–17.0)
Immature Granulocytes: 1 %
Lymphocytes Relative: 6 %
Lymphs Abs: 0.5 K/uL — ABNORMAL LOW (ref 0.7–4.0)
MCH: 32.9 pg (ref 26.0–34.0)
MCHC: 33.5 g/dL (ref 30.0–36.0)
MCV: 98.2 fL (ref 80.0–100.0)
Monocytes Absolute: 0.8 K/uL (ref 0.1–1.0)
Monocytes Relative: 8 %
Neutro Abs: 8.3 K/uL — ABNORMAL HIGH (ref 1.7–7.7)
Neutrophils Relative %: 85 %
Platelets: 356 K/uL (ref 150–400)
RBC: 3.89 MIL/uL — ABNORMAL LOW (ref 4.22–5.81)
RDW: 12.9 % (ref 11.5–15.5)
WBC: 9.7 K/uL (ref 4.0–10.5)
nRBC: 0 % (ref 0.0–0.2)

## 2024-09-22 LAB — URINALYSIS, ROUTINE W REFLEX MICROSCOPIC
Bilirubin Urine: NEGATIVE
Glucose, UA: >=500 mg/dL — AB
Hgb urine dipstick: NEGATIVE
Ketones, ur: 80 mg/dL — AB
Leukocytes,Ua: NEGATIVE
Nitrite: NEGATIVE
Protein, ur: 100 mg/dL — AB
Specific Gravity, Urine: 1.024 (ref 1.005–1.030)
pH: 5 (ref 5.0–8.0)

## 2024-09-22 LAB — COMPREHENSIVE METABOLIC PANEL WITH GFR
ALT: 9 U/L (ref 0–44)
AST: 21 U/L (ref 15–41)
Albumin: 3.3 g/dL — ABNORMAL LOW (ref 3.5–5.0)
Alkaline Phosphatase: 93 U/L (ref 38–126)
Anion gap: 20 — ABNORMAL HIGH (ref 5–15)
BUN: 19 mg/dL (ref 8–23)
CO2: 18 mmol/L — ABNORMAL LOW (ref 22–32)
Calcium: 9.9 mg/dL (ref 8.9–10.3)
Chloride: 96 mmol/L — ABNORMAL LOW (ref 98–111)
Creatinine, Ser: 1.1 mg/dL (ref 0.61–1.24)
GFR, Estimated: >60 mL/min
Glucose, Bld: 377 mg/dL — ABNORMAL HIGH (ref 70–99)
Potassium: 4.4 mmol/L (ref 3.5–5.1)
Sodium: 134 mmol/L — ABNORMAL LOW (ref 135–145)
Total Bilirubin: 0.2 mg/dL (ref 0.0–1.2)
Total Protein: 7.7 g/dL (ref 6.5–8.1)

## 2024-09-22 LAB — BODY FLUID CELL COUNT WITH DIFFERENTIAL
Eos, Fluid: 0 %
Lymphs, Fluid: 4 %
Monocyte-Macrophage-Serous Fluid: 4 % — ABNORMAL LOW (ref 50–90)
Neutrophil Count, Fluid: 92 % — ABNORMAL HIGH (ref 0–25)
Total Nucleated Cell Count, Fluid: 261 uL (ref 0–1000)

## 2024-09-22 LAB — BASIC METABOLIC PANEL WITH GFR
Anion gap: 21 — ABNORMAL HIGH (ref 5–15)
Anion gap: 17 — ABNORMAL HIGH (ref 5–15)
Anion gap: 11 (ref 5–15)
BUN: 19 mg/dL (ref 8–23)
BUN: 18 mg/dL (ref 8–23)
BUN: 16 mg/dL (ref 8–23)
CO2: 14 mmol/L — ABNORMAL LOW (ref 22–32)
CO2: 16 mmol/L — ABNORMAL LOW (ref 22–32)
CO2: 21 mmol/L — ABNORMAL LOW (ref 22–32)
Calcium: 9.3 mg/dL (ref 8.9–10.3)
Calcium: 9 mg/dL (ref 8.9–10.3)
Calcium: 9.3 mg/dL (ref 8.9–10.3)
Chloride: 101 mmol/L (ref 98–111)
Chloride: 103 mmol/L (ref 98–111)
Chloride: 105 mmol/L (ref 98–111)
Creatinine, Ser: 1.08 mg/dL (ref 0.61–1.24)
Creatinine, Ser: 1.02 mg/dL (ref 0.61–1.24)
Creatinine, Ser: 0.89 mg/dL (ref 0.61–1.24)
GFR, Estimated: >60 mL/min
GFR, Estimated: >60 mL/min
GFR, Estimated: >60 mL/min
Glucose, Bld: 362 mg/dL — ABNORMAL HIGH (ref 70–99)
Glucose, Bld: 269 mg/dL — ABNORMAL HIGH (ref 70–99)
Glucose, Bld: 168 mg/dL — ABNORMAL HIGH (ref 70–99)
Potassium: 4.3 mmol/L (ref 3.5–5.1)
Potassium: 3.8 mmol/L (ref 3.5–5.1)
Potassium: 3.8 mmol/L (ref 3.5–5.1)
Sodium: 135 mmol/L (ref 135–145)
Sodium: 136 mmol/L (ref 135–145)
Sodium: 137 mmol/L (ref 135–145)

## 2024-09-22 LAB — GLUCOSE, CAPILLARY
Glucose-Capillary: 173 mg/dL — ABNORMAL HIGH (ref 70–99)
Glucose-Capillary: 179 mg/dL — ABNORMAL HIGH (ref 70–99)
Glucose-Capillary: 184 mg/dL — ABNORMAL HIGH (ref 70–99)
Glucose-Capillary: 201 mg/dL — ABNORMAL HIGH (ref 70–99)
Glucose-Capillary: 227 mg/dL — ABNORMAL HIGH (ref 70–99)
Glucose-Capillary: 234 mg/dL — ABNORMAL HIGH (ref 70–99)
Glucose-Capillary: 243 mg/dL — ABNORMAL HIGH (ref 70–99)
Glucose-Capillary: 334 mg/dL — ABNORMAL HIGH (ref 70–99)

## 2024-09-22 LAB — BLOOD GAS, VENOUS
Acid-base deficit: 6 mmol/L — ABNORMAL HIGH (ref 0.0–2.0)
Bicarbonate: 19.6 mmol/L — ABNORMAL LOW (ref 20.0–28.0)
O2 Saturation: 30.7 %
Patient temperature: 37
pCO2, Ven: 38 mmHg — ABNORMAL LOW (ref 44–60)
pH, Ven: 7.32 (ref 7.25–7.43)
pO2, Ven: <31 mmHg — CL (ref 32–45)

## 2024-09-22 LAB — RESP PANEL BY RT-PCR (RSV, FLU A&B, COVID)  RVPGX2
Influenza A by PCR: NEGATIVE
Influenza B by PCR: NEGATIVE
Resp Syncytial Virus by PCR: NEGATIVE
SARS Coronavirus 2 by RT PCR: NEGATIVE

## 2024-09-22 LAB — BETA-HYDROXYBUTYRIC ACID
Beta-Hydroxybutyric Acid: 3.92 mmol/L — ABNORMAL HIGH (ref 0.05–0.27)
Beta-Hydroxybutyric Acid: 4.22 mmol/L — ABNORMAL HIGH (ref 0.05–0.27)
Beta-Hydroxybutyric Acid: 3.02 mmol/L — ABNORMAL HIGH (ref 0.05–0.27)

## 2024-09-22 LAB — CBG MONITORING, ED: Glucose-Capillary: 325 mg/dL — ABNORMAL HIGH (ref 70–99)

## 2024-09-22 MED ORDER — ACETAMINOPHEN 650 MG RE SUPP: 650.0000 mg | Freq: Four times a day (QID) | RECTAL | Status: DC | PRN

## 2024-09-22 MED ORDER — ONDANSETRON HCL 4 MG PO TABS: 4.0000 mg | ORAL_TABLET | Freq: Four times a day (QID) | ORAL | Status: DC | PRN

## 2024-09-22 MED ORDER — ACETAMINOPHEN 325 MG PO TABS
650.0000 mg | ORAL_TABLET | Freq: Four times a day (QID) | ORAL | Status: DC | PRN
  Administered 2024-09-22 – 2024-09-28 (×5): 650 mg via ORAL
  Filled 2024-09-22 (×5): qty 2

## 2024-09-22 MED ORDER — SODIUM CHLORIDE 0.9 % IV BOLUS
1000.0000 mL | Freq: Once | INTRAVENOUS | Status: AC
  Administered 2024-09-22: 1000 mL via INTRAVENOUS

## 2024-09-22 MED ORDER — KETOROLAC TROMETHAMINE 30 MG/ML IJ SOLN
30.0000 mg | Freq: Once | INTRAMUSCULAR | Status: AC
  Administered 2024-09-22: 30 mg via INTRAVENOUS
  Filled 2024-09-22: qty 1

## 2024-09-22 MED ORDER — ONDANSETRON HCL 4 MG/2ML IJ SOLN: 4.0000 mg | Freq: Four times a day (QID) | INTRAMUSCULAR | Status: DC | PRN

## 2024-09-22 MED ORDER — DEXTROSE 50 % IV SOLN: 0.0000 mL | INTRAVENOUS | Status: DC | PRN

## 2024-09-22 MED ORDER — LIDOCAINE-EPINEPHRINE 1 %-1:100000 IJ SOLN
INTRAMUSCULAR | Status: AC
  Filled 2024-09-22: qty 20

## 2024-09-22 MED ORDER — LACTATED RINGERS IV SOLN: INTRAVENOUS | Status: AC

## 2024-09-22 MED ORDER — ENOXAPARIN SODIUM 40 MG/0.4ML IJ SOSY: 40.0000 mg | PREFILLED_SYRINGE | INTRAMUSCULAR | Status: DC

## 2024-09-22 MED ORDER — POTASSIUM CHLORIDE 10 MEQ/100ML IV SOLN
10.0000 meq | INTRAVENOUS | Status: AC
  Administered 2024-09-22 (×2): 10 meq via INTRAVENOUS
  Filled 2024-09-22 (×2): qty 100

## 2024-09-22 MED ORDER — SODIUM CHLORIDE 0.9% FLUSH
3.0000 mL | Freq: Two times a day (BID) | INTRAVENOUS | Status: DC
  Administered 2024-09-22 – 2024-10-03 (×20): 3 mL via INTRAVENOUS

## 2024-09-22 MED ORDER — OXYCODONE HCL 5 MG PO TABS
5.0000 mg | ORAL_TABLET | Freq: Four times a day (QID) | ORAL | Status: DC | PRN
  Administered 2024-09-25 – 2024-09-27 (×4): 5 mg via ORAL
  Filled 2024-09-22 (×4): qty 1

## 2024-09-22 MED ORDER — ACETAMINOPHEN 325 MG PO TABS
650.0000 mg | ORAL_TABLET | Freq: Once | ORAL | Status: AC
  Administered 2024-09-22: 650 mg via ORAL
  Filled 2024-09-22: qty 2

## 2024-09-22 MED ORDER — POLYETHYLENE GLYCOL 3350 17 G PO PACK: 17.0000 g | PACK | Freq: Every day | ORAL | Status: DC | PRN

## 2024-09-22 MED ORDER — INSULIN REGULAR(HUMAN) IN NACL 100-0.9 UT/100ML-% IV SOLN
INTRAVENOUS | Status: AC
  Administered 2024-09-22: 15 [IU]/h via INTRAVENOUS
  Administered 2024-09-23: 4.6 [IU]/h via INTRAVENOUS
  Filled 2024-09-22 (×2): qty 100

## 2024-09-22 MED ORDER — DEXTROSE IN LACTATED RINGERS 5 % IV SOLN: INTRAVENOUS | Status: AC

## 2024-09-22 NOTE — Procedures (Signed)
 PROCEDURE SUMMARY:  Successful image-guided left thoracentesis.  Yielded 50 mL of yellow fluid. Fluid appeared complex on US . Patient tolerated procedure well. EBL: trace No immediate complications.  Specimen was sent for labs. Post procedure CXR ordered.  Please see imaging section of Epic for full dictation.  Kimble DEL Caroll Weinheimer PA-C 09/22/2024 3:53 PM

## 2024-09-22 NOTE — Hospital Course (Addendum)
 Kevin Olson is a 79 y.o. male with PMH of diabetes, history of prostate cancer, who has been sick for about 2 week, presented to the ED and found to have with DKA and large left pleural effusion and admitted on 1/5 and s/p left thoracentesis 50 cc yellow fluid, complex on ultrasound. Recent ED visit 1/1-diagnosed with pneumonia and discharged, also noted to be hyperglycemic with high anion gap at that time.  CT abdomen pelvis with contrast on 1/1 no acute finding. DKA resolved. Having intermittent fever 1/6-CT chest done 1/7- Empyema w/  LUL/LL consolidation-possible pneumonia. PCCM consulted and IR placed pigtail catheter/chest tube 1/8 S/P pleural lytics multiple times PCCM. Chest tube was not removed 1/12-repeat chest x-ray shows no pneumothorax and stable small left pleural effusion low lung volume  Subjective: Seen and examined  Resting comfortably Overnight remains afebrile BP stable labs show mild leukocytosis BMP SHOWS MILD HYPOKALEMIA  Assessment and plan:  Left loculated complex pleural effusion- suspected empyema Left-sided community-acquired pneumonia-recently treated: W/  having intermittent fever. S/p  50 cc thoracentesis done 1/5-cx negative, blood culture NGTD. fluid LDH 639 - is exudative-serum LDH -134 serum protein 6.6 pleural fluid protein 4.7-patient having intermittent fever. 1/7 CT chest:pockets of air noted within no fluid on CT could be due to elevation thoracentesis versus empyema 1/8:S/p IR  pigtail drain/chest tube placement - pleural fluid culture NGTD  S/P lytics x 3 ( 1/8,1/9,1/10) with some bright red blood from the chest tube after fibrinolytics on 1/10 Repeat CT chest obtained 1/11 shows catheter in place no pneumothorax small residual left pleural effusion, consolidative changes of the majority of the left lower lobe may represent a compressive atelectasis or pneumonia Chest tube was not removed 1/12-repeat chest x-ray shows no pneumothorax and stable small  left pleural effusion low lung volume Being managed with Zosyn  >>> for D/C switch to Augmentin  to complete 14 days of antibiotic therapy   DKA T2DM , non-insulin -dependent, with neuropathy, 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  DKA likely from patient's pneumonia,A1c 13.2 indicating poor control at baseline. DKA resolved, started on basal bolus insulin  regimen will need home insulin .  RN to teach injection. Per patient and his wife able to do insulin  inj Continue current Lantus   24 u, Premeal 6 u tid, ssi 0-9 - DM coordinator following home insulin  teaching Recent Labs  Lab 09/29/24 1206 09/29/24 1709 09/29/24 2151 09/30/24 0751 09/30/24 1134  GLUCAP 133* 165* 194* 136* 153*   Hypertension Hyperlipidemia: Cardiomegaly per CT Aortic atherosclerosis per CT: PTA valsartan bedtime, BP now well-controlled continue valsartan, continue Lipitor.On Repatha  as outpatient.  Chronic anemia on iron supplement: Stable hb. 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  Mobility: PT Orders: Active PT Follow up Rec: Skilled Nursing-Short Term Rehab (<3 Hours/Day)09/26/2024 1354    DVT prophylaxis: enoxaparin  (LOVENOX ) injection 40 mg Start: 09/23/24 1200 SCDs Start: 09/22/24 1444 Code Status:   Code Status: Limited: Do not attempt resuscitation (DNR) -DNR-LIMITED -Do Not Intubate/DNI  Family Communication: plan of care discussed with patient at  bedside. Patient status is: Remains hospitalized because of severity of illness Level of care: Med-Surg   Dispo: The patient is from: home w/ wife            Anticipated disposition: SNF once stable and off chest tube.  Objective: Vitals last 24 hrs: Vitals:   09/29/24 0501 09/29/24 1330 09/29/24 2127 09/30/24 0346  BP: 130/62 (!) 121/52 136/61 138/74  Pulse: 66 66 72 73  Resp: 15 16 18 18   Temp: (!) 97.4 F (36.3 C) (!) 97.5 F (36.4 C) 97.8 F (36.6 C) 97.7 F (36.5 C)  TempSrc: Oral Oral Oral Oral  SpO2: 95% 97% 96% 96%  Weight:      Height:        Physical Examination: General exam:AAOX3 HEENT:Oral mucosa moist, Ear/Nose WNL grossly Respiratory system: Bilaterally clear breath sounds  Cardiovascular system: S1 & S2 +, No JVD. Gastrointestinal system: Abdomen soft,NT,ND, BS+ Nervous System: Alert, awake, non focal Extremities: extremities warm, leg edema neg Skin: Warm, no rashes MSK: Normal muscle bulk,tone, power   Medications reviewed:  Scheduled Meds:  atorvastatin   10 mg Oral QHS   Chlorhexidine  Gluconate Cloth  6 each Topical Daily   DULoxetine   60 mg Oral QHS   enoxaparin  (LOVENOX ) injection  40 mg Subcutaneous Q24H   feeding supplement (GLUCERNA SHAKE)  237 mL Oral TID BM   insulin  aspart  0-5 Units Subcutaneous QHS   insulin  aspart  0-9 Units Subcutaneous TID WC   insulin  aspart  6 Units Subcutaneous TID WC   insulin  glargine  24 Units Subcutaneous Daily   irbesartan   37.5 mg Oral Daily   levothyroxine   137 mcg Oral QAC breakfast   mirabegron  ER  25 mg Oral q1800   multivitamin with minerals  1 tablet Oral Daily   pantoprazole   40 mg Oral BID   primidone   250 mg Oral BID   primidone   50 mg Oral Q1200   sodium chloride  flush  3 mL Intravenous Q12H   tamsulosin   0.8 mg Oral QHS   Continuous Infusions:  piperacillin -tazobactam (ZOSYN )  IV 3.375 g (09/30/24 1032)   Diet: Diet Order             Diet Carb Modified Room service appropriate? Yes   Diet effective now

## 2024-09-22 NOTE — ED Notes (Signed)
 Requested urine sample from patient, he stated he would try a urinal.

## 2024-09-22 NOTE — ED Notes (Addendum)
 Pt currently in internal radiology. Unable to medicate at this time . IR stated that they will bring pt up to the ICU

## 2024-09-22 NOTE — H&P (Addendum)
 " History and Physical    Patient: Kevin Olson FMW:[id number] DOB: 05/09/1946 DOA: 09/22/2024 DOS: the patient was seen and examined on 09/22/2024 PCP: Nanci Senior, MD  Patient coming from: Home  Chief Complaint:  Chief Complaint  Patient presents with   Hyperglycemia   HPI:  79 year old man PMH diabetes, history of prostate cancer, who has been sick for about 2 weeks now, presented with DKA and large left pleural effusion.    Seen in the ED 1/1 diagnosed with pneumonia and discharged.  Noted to be hyperglycemic with high anion gap at that time.    Completed antibiotics but has become weaker, and has had a fall.  A bit confused now.  Blood sugars been running high.  Has a sweet tooth per wife.  Blood sugars much worse than usual.  Abdominal pain has resolved, last week he had abdominal pain radiating up into the left side of his chest.  Has been quite thirsty.  Review of Systems: Negative for fever, visual changes, sore throat, new rash or muscle aches, chest pain, positive for some increased shortness of breath.  Past Medical History:  Diagnosis Date   Depression    DM2 (diabetes mellitus, type 2) (HCC)    Dyslipidemia    Family history of brain cancer    Family history of pancreatic cancer    Hypertension    OA (osteoarthritis) of knee    Prostate cancer [facility])    prostate   Past Surgical History:  Procedure Laterality Date   BIOPSY  03/19/2022   Procedure: BIOPSY;  Surgeon: Rosalie Kitchens, MD;  Location: [facility];  Service: Gastroenterology;;   ORIN MEDIATE RELEASE Right 12/05/2023   Procedure: CARPAL TUNNEL RELEASE;  Surgeon: Romona Harari, MD;  Location: [facility];  Service: Orthopedics;  Laterality: Right;   ESOPHAGOGASTRODUODENOSCOPY (EGD) WITH PROPOFOL  N/A 03/19/2022   Procedure: ESOPHAGOGASTRODUODENOSCOPY (EGD) WITH PROPOFOL ;  Surgeon: Rosalie Kitchens, MD;  Location: [facility];  Service: Gastroenterology;  Laterality: N/A;   INGUINAL HERNIA REPAIR      PROSTATE BIOPSY     TOTAL KNEE ARTHROPLASTY Left 10/26/2020   Procedure: TOTAL KNEE ARTHROPLASTY;  Surgeon: Ernie Cough, MD;  Location: [facility];  Service: Orthopedics;  Laterality: Left;  70 mins   TOTAL KNEE ARTHROPLASTY Right 12/21/2020   Procedure: TOTAL KNEE ARTHROPLASTY;  Surgeon: Ernie Cough, MD;  Location: [facility];  Service: Orthopedics;  Laterality: Right;  70 mins   Social History:  reports that he quit smoking about 45 years ago. His smoking use included cigarettes. He started smoking about 57 years ago. He has a 24 pack-year smoking history. He has never used smokeless tobacco. He reports current alcohol use. He reports that he does not use drugs.  Allergies[1]  Family History  Problem Relation Age of Onset   CAD Mother    Pancreatic cancer Sister 93   Other Sister        brain tumor   Leukemia Sister    Celiac disease Sister    CAD Brother        CABGx4    CAD Brother        CABGx4   Lung cancer Maternal Grandfather    Lung cancer Paternal Grandfather    Cancer Maternal Aunt        unknown   Celiac disease Maternal Aunt    Cancer Maternal Uncle        unknown   Cancer Cousin        unknown   Spina  bifida Niece    Colon cancer Neg Hx    Breast cancer Neg Hx     Prior to Admission medications  Medication Sig Start Date End Date Taking? Authorizing Provider  amoxicillin -clavulanate (AUGMENTIN ) 875-125 MG tablet Take 1 tablet by mouth every 12 (twelve) hours for 5 days. 09/18/24 09/23/24 Yes [name], [name], DO  atorvastatin  (LIPITOR) 10 MG tablet Take 10 mg by mouth at bedtime.   Yes [provider]  baclofen (LIORESAL) 10 MG tablet Take 10 mg by mouth See admin instructions. Am pm 08/27/17  Yes [provider]  DULoxetine  (CYMBALTA ) 60 MG capsule Take 60 mg by mouth at bedtime. 08/14/17  Yes [provider]  levothyroxine  (SYNTHROID ) 137 MCG tablet Take 137 mcg by mouth daily before breakfast.   Yes [provider]  REPATHA   SURECLICK 140 MG/ML SOAJ INJECT 140 MG UNDER THE SKIN EVERY 2 WEEKS (14 DAYS) AS DIRECTED 03/14/24  Yes Hilty, Vinie BROCKS, MD  atorvastatin  (LIPITOR) 40 MG tablet Take 1 tablet (40 mg total) by mouth daily. Patient not taking: Reported on 09/22/2024 01/30/24   [name], MD  azithromycin  (ZITHROMAX ) 250 MG tablet Take 1 tablet (250 mg total) by mouth daily. Take 1 every day until finished. Patient not taking: Reported on 09/22/2024 09/18/24   [name], [name], DO  Blood Glucose Monitoring Suppl (TRUE METRIX AIR GLUCOSE METER) DEVI True Metrix Air Glucose Meter kit    [provider]  Cholecalciferol (VITAMIN D3) 50 MCG (2000 UT) TABS Take 2,000 Units by mouth every morning.    [provider]  Coenzyme Q10-Vitamin E (QUNOL ULTRA COQ10 PO) Take 1 capsule by mouth at bedtime.    [provider]  ferrous sulfate  325 (65 FE) MG tablet Take 325 mg by mouth 3 (three) times daily with meals.    [provider]  glucose blood (TRUE METRIX BLOOD GLUCOSE TEST) test strip True Metrix Glucose Test Strip    [provider]  oxyCODONE  (ROXICODONE ) 5 MG immediate release tablet Take 1 tablet (5 mg total) by mouth every 6 (six) hours as needed. 09/18/24   [name], [name], DO  pantoprazole  (PROTONIX ) 40 MG tablet TAKE 1 TABLET BY MOUTH TWICE A DAY 04/10/22   [name], DO  pioglitazone  (ACTOS ) 15 MG tablet Take 1 tablet (15 mg total) by mouth daily. 09/18/24   [name], [name], DO  primidone  (MYSOLINE ) 250 MG tablet Take 250 mg by mouth daily.    [provider]  primidone  (MYSOLINE ) 50 MG tablet Increase up to 5 tabs qam as directed in addition to the 250mg  at bedtime 12/15/22   [provider]  senna-docusate (SENOKOT-S) 8.6-50 MG tablet Take 1 tablet by mouth daily. 09/18/24   [name], [name], DO  tamsulosin  (FLOMAX ) 0.4 MG CAPS capsule Take 0.8 mg by mouth in the morning.    [provider]  TRUEplus Lancets 33G MISC  TRUEplus Lancets 33 gauge    [provider]    Physical Exam: Vitals:   09/22/24 1031 09/22/24 1034 09/22/24 1425  BP:  (!) 156/85   Pulse:  73   Temp:  97.8 F (36.6 C) 97.8 F (36.6 C)  TempSrc:  Oral Oral  SpO2:  94%   Weight: 108 kg    Height: 5' 10 (1.778 m)     Physical Exam Vitals reviewed.  Constitutional:      General: He is not in acute distress.    Appearance: He is ill-appearing.  He is not toxic-appearing.  HENT:     Mouth/Throat:     Comments: Mucous membranes dry Cardiovascular:     Rate and Rhythm: Normal rate and regular rhythm.     Heart sounds: No murmur heard. Pulmonary:     Effort: Pulmonary effort is normal. No respiratory distress.     Breath sounds: No wheezing, rhonchi or rales.     Comments: Somewhat diminished breath sounds in the left but fairly good air movement. Musculoskeletal:     Right lower leg: No edema.     Left lower leg: No edema.  Skin:    Findings: Bruising (Bilateral lower extremities) present.  Neurological:     General: No focal deficit present.     Mental Status: He is alert and oriented to person, place, and time.  Psychiatric:        Mood and Affect: Mood normal.        Behavior: Behavior normal.    Data Reviewed: Blood sugar 377, CO2 18, anion gap 20 Beta hydroxybutyric acid 3.92 Hemoglobin 12.8, normal WBC Influenza, SARS Cov 2, RSV negative Chest x-ray independently reviewed shows large left pleural effusion new compared to prior study 1/1  Assessment and Plan: DKA Diabetes mellitus type 2 non-insulin -dependent Acute metabolic encephalopathy secondary to above Inciting event unclear, although recently treated for pneumonia and now has large left pleural effusion Bolus IV fluids then start insulin  infusion and follow protocol  Large left pleural effusion Left-sided community-acquired pneumonia recently treated No leukocytosis or fever.  Recently completed antibiotics for pneumonia.  Now has large  left pleural effusion.  Oxygen low 90s. Diagnostic and therapeutic pleurocentesis Consider chest CT afterwards Hold off on any further antibiotics  Acute urinary retention Foley catheter placed in the ED, voiding trial tomorrow   Advance Care Planning: DNR per wife  Consults: none   Family Communication: wife at bedside  Severity of Illness: The appropriate patient status for this patient is INPATIENT. Inpatient status is judged to be reasonable and necessary in order to provide the required intensity of service to ensure the patient's safety. The patient's presenting symptoms, physical exam findings, and initial radiographic and laboratory data in the context of their chronic comorbidities is felt to place them at high risk for further clinical deterioration. Furthermore, it is not anticipated that the patient will be medically stable for discharge from the hospital within 2 midnights of admission.   * I certify that at the point of admission it is my clinical judgment that the patient will require inpatient hospital care spanning beyond 2 midnights from the point of admission due to high intensity of service, high risk for further deterioration and high frequency of surveillance required.*  Author: Toribio Door, MD 09/22/2024 2:38 PM  For on call review [email or web address].      [1]  Allergies Allergen Reactions   Metformin And Related Diarrhea and Other (See Comments)    Explosive diarrhea   "

## 2024-09-22 NOTE — Progress Notes (Signed)
 Verbal from Dr. Jadine ok to give noncaloric drinks while on insulin  drip.

## 2024-09-22 NOTE — ED Provider Notes (Signed)
 " [place] EMERGENCY DEPARTMENT AT [facility] Provider Note   CSN: [id number] Arrival date & time: 09/22/24  1019     Patient presents with: Hyperglycemia   Kevin Olson is a 79 y.o. male.   Kevin Olson is a 79 year old male who presents to the ED with complaints of worsening lethargy and weakness over the past couple days.    Patient was recently seen 09/18/2024 with left lower quadrant pain and a glucose of 612.  He was discharged with hyperglycemia without DKA after IVF and insulin .  He was told to continue taking his pioglitazone  as he discontinued that prior to that ED visit.  He stated that he has not missed a dose since then.  He was treated for possible Due to CT pelvis and chest x-ray showing mild left lower lobe airspace opacity.  He finished his antibiotics today.  Currently denies: Chest pain, stomach pain, headache, fever, chills, urinary frequency, dysuria.  Endorses lightheadedness, occasional shortness of breath, dizziness, diarrhea ongoing for the past 4 days (3 bowel movements a day, patient states he thinks this is unrelated to the antibiotic use).  He has had decreased oral intake due to his presentation.  He does not use oxygen at home.  Dr. Cottie spoke with wife who stated that she has concern for confusion.  He has not been acting like himself and was speaking out of his mind earlier today.  She reports a fever.    Hyperglycemia      Prior to Admission medications  Medication Sig Start Date End Date Taking? Authorizing Provider  ALPRAZolam  (XANAX ) 1 MG tablet Take 1 mg by mouth at bedtime. 08/21/17   [provider]  amoxicillin -clavulanate (AUGMENTIN ) 875-125 MG tablet Take 1 tablet by mouth every 12 (twelve) hours for 5 days. 09/18/24 09/23/24  [name], [name], DO  atorvastatin  (LIPITOR) 40 MG tablet Take 1 tablet (40 mg total) by mouth daily. 01/30/24   Hilty, Vinie BROCKS, MD  azithromycin  (ZITHROMAX ) 250 MG tablet Take 1 tablet (250 mg total)  by mouth daily. Take 1 every day until finished. 09/18/24   [name], [name], DO  baclofen (LIORESAL) 10 MG tablet Take 10-20 mg by mouth 2 (two) times daily. Take 1 tablet (10 mg) in the morning and Take 2 tablets (20 mg) at bedtime 08/27/17   [provider]  Blood Glucose Monitoring Suppl (TRUE METRIX AIR GLUCOSE METER) DEVI True Metrix Air Glucose Meter kit    [provider]  Cholecalciferol (VITAMIN D3) 50 MCG (2000 UT) TABS Take 2,000 Units by mouth every morning.    [provider]  Coenzyme Q10-Vitamin E (QUNOL ULTRA COQ10 PO) Take 1 capsule by mouth at bedtime.    [provider]  cyclobenzaprine (FLEXERIL) 5 MG tablet  02/10/14   [provider]  DULoxetine  (CYMBALTA ) 60 MG capsule Take 60 mg by mouth every evening. 08/14/17   [provider]  ferrous sulfate  325 (65 FE) MG tablet Take 325 mg by mouth 3 (three) times daily with meals.    [provider]  glimepiride (AMARYL) 4 MG tablet Take 4 mg by mouth daily with breakfast. 08/14/17   [provider]  glucose blood (TRUE METRIX BLOOD GLUCOSE TEST) test strip True Metrix Glucose Test Strip    [provider]  levothyroxine  (SYNTHROID ) 100 MCG tablet Take 100 mcg by mouth daily before breakfast. 05/22/19   [provider]  losartan (COZAAR) 25 MG tablet Take 12.5 mg by mouth every  evening. 12/24/13   [provider]  metFORMIN (GLUCOPHAGE) 1000 MG tablet Take 1,000 mg by mouth in the morning and at bedtime.    [provider]  oxyCODONE  (ROXICODONE ) 5 MG immediate release tablet Take 1 tablet (5 mg total) by mouth every 6 (six) hours as needed. 09/18/24   [name], [name], DO  pantoprazole  (PROTONIX ) 40 MG tablet TAKE 1 TABLET BY MOUTH TWICE A DAY 04/10/22   [name], DO  pioglitazone  (ACTOS ) 15 MG tablet Take 1 tablet (15 mg total) by mouth daily. 09/18/24   [name], [name], DO  primidone  (MYSOLINE ) 250 MG tablet  Take 250 mg by mouth daily.    [provider]  primidone  (MYSOLINE ) 50 MG tablet Increase up to 5 tabs qam as directed in addition to the 250mg  at bedtime 12/15/22   [provider]  REPATHA  SURECLICK 140 MG/ML SOAJ INJECT 140 MG UNDER THE SKIN EVERY 2 WEEKS (14 DAYS) AS DIRECTED 03/14/24   Hilty, Vinie BROCKS, MD  senna-docusate (SENOKOT-S) 8.6-50 MG tablet Take 1 tablet by mouth daily. 09/18/24   [name], [name], DO  sitaGLIPtin-metformin (JANUMET) 50-1000 MG tablet Take by mouth. 09/08/20   [provider]  sulfamethoxazole-trimethoprim (BACTRIM DS) 800-160 MG tablet Take 1 tablet by mouth 2 (two) times daily. 08/03/22   [provider]  tamsulosin  (FLOMAX ) 0.4 MG CAPS capsule Take 0.8 mg by mouth in the morning.    [provider]  TRUEplus Lancets 33G MISC TRUEplus Lancets 33 gauge    [provider]    Allergies: Patient has no known allergies.    Review of Systems  Updated Vital Signs BP (!) 156/85 (BP Location: Right Arm)   Pulse 73   Temp 97.8 F (36.6 C) (Oral)   Ht 5' 10 (1.778 m)   Wt 108 kg   SpO2 94%   BMI 34.16 kg/m   Physical Exam Constitutional:      Appearance: He is obese. He is ill-appearing and toxic-appearing.  Cardiovascular:     Rate and Rhythm: Normal rate and regular rhythm.     Pulses:          Radial pulses are 2+ on the left side.       Dorsalis pedis pulses are 2+ on the right side and 2+ on the left side.     Heart sounds: Normal heart sounds. No murmur heard.    No friction rub. No gallop.  Pulmonary:     Effort: Pulmonary effort is normal.     Breath sounds: No stridor. Rales (Lower lobe bilaterally) present. No wheezing or rhonchi.  Abdominal:     General: There is distension.     Palpations: Abdomen is soft.     Tenderness: There is no abdominal tenderness. There is no guarding.     Comments: Protuberant, dull to percussion, hyperactive bowel sounds  Musculoskeletal:     Right lower  leg: Edema (1+) present.     Left lower leg: Edema (1+) present.  Skin:    General: Skin is warm and dry.     Comments: Pale     (all labs ordered are listed, but only abnormal results are displayed) Labs Reviewed  CBG MONITORING, ED - Abnormal; Notable for the following components:      Result Value   Glucose-Capillary 325 (*)    All other components within normal limits  RESP PANEL BY RT-PCR (RSV, FLU A&B, COVID)  RVPGX2  CBC WITH DIFFERENTIAL/PLATELET  COMPREHENSIVE METABOLIC  PANEL WITH GFR  URINALYSIS, ROUTINE W REFLEX MICROSCOPIC  BLOOD GAS, VENOUS  BETA-HYDROXYBUTYRIC ACID    EKG: None  Radiology: No results found.   Procedures   Medications Ordered in the ED - No data to display  Clinical Course as of 09/22/24 1330  Mon Sep 22, 2024  [id number] This 79 year old male presenting to the ED in the company of his wife with concern for confusion.  They were seen in the ER approximately 4 days ago with left-sided chest pain, had CT imaging of the abdomen and an x-ray of the chest which was notable for left lower sided pneumonia.  They were treated with antibiotics which he continues at home, with azithromycin  and amoxicillin .  His wife and him reports that his chest pain had largely resolved with the antibiotics, and he was breathing and sleeping better at night.  However they are concerned that he has waxing and waning confusion and generalized weakness and now was not able to get up and walk.  His wife said this is very unusual for him.  He has been having loose bowel movements, after taking senna and his antibiotics, but they are still formed stool.  He feels bloated in the abdomen, but is passing gas and not having any nausea or vomiting.  In the ED his vital signs are unremarkable aside from some mild hyper tension.  He does appear quite tired, responds confusedly to certain questions.  His wife said is unlike him.  He has a large abdomen with some distention but no rebound or  guarding.  Rhonchi in the lower lobes.  No hypoxia.  Labs are notable for normal white blood cell count, no evident significant anemia.  He does have an anion gap of 20, and is hyperglycemic with blood sugar 377.  He has a history of diabetes per his wife's report and is on oral hyperglycemic medicine, not insulin .  He is not acidotic on his VBG and does not have a significantly low bicarb.  We are still pending flu testing, as viral illness is on the differential.  He is also pending a UA, and reportedly has had new incontinence for the past several days. [MT]  1227 Low suspicion for sepsis or stroke at this time.  Doubt cauda equina syndrome.  Chest x-ray films are pending [MT]  1237 Nearly 700 cc of urine on bladder scan, instructed nurse to insert Foley [MT]  1311 No clear evidence of infection on urinalysis, although it is concentrated with ketones, protein, loss of glucose noted.  Nitrates and leukocytes are negative [MT]  1326 Symptoms not consistent with CVA, brain bleed, or other emergent neuroimaging condition [MT]    Clinical Course User Index [MT] Cottie Donnice PARAS, MD                                 Medical Decision Making This patient is a 79 y.o. male who presents to the ED for concern of lethargy and weakness and waxing and waning confusion, this involves an extensive number of treatment options, and is a complaint that carries with it a high risk of complications and morbidity. The emergent differential diagnosis prior to evaluation includes, but is not limited to, sepsis, encephalopathy, UTI, delirium, DKA. This is not an exhaustive differential.   Past Medical History / Co-morbidities / Social History: Patient was recently seen 09/18/2024 with left lower quadrant pain and a glucose of 612.  He  was discharged with hyperglycemia without DKA after IVF and insulin .  He was told to continue taking his pioglitazone  as he discontinued that prior to that ED visit.  He stated that he has not  missed a dose since then.  He was treated for possible Due to CT pelvis and chest x-ray showing mild left lower lobe airspace opacity.  He finished his antibiotics today.  Additional history: Chart reviewed. 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 The pertinent findings include:  Bilateral crackles appreciated lower lung bases.  Hemodynamically stable, extremities well-perfused.  Abdominal exam: Distended, dull to percussion, no tenderness.  Not hypoxic  Lab Tests: I ordered, and personally interpreted labs.  The pertinent results include:   - Urinalysis: No evidence of infection, concentrated with ketones - CMP: Anion gap 20, hyperglycemia 377 - CBC: No leukocytosis - Beta hydroxybutyric acid: 3.92 - VBG: pH 7.32, pCO2 38, pO2 <31, bicarb 19.6 - Respiratory viral panel: Negative - Bladder scan: 680 mL.  Foley catheter inserted   Imaging Studies: I ordered imaging studies including and I independently visualized and interpreted imaging - I agree with the radiologist interpretation. Below are the tests ordered and my interpretations: -Chest x-ray: Moderate to large left pleural effusion  EKG/Cardiac Monitoring:  Deferred   Medications: I ordered medication including - Tylenol  650 mg - Toradol  30 mg - Sodium chloride  0.9% bolus 1 L  Consultations Obtained: I requested consultation with the hospitalist,  and discussed lab and imaging findings as well as pertinent plan - they recommend: Admission   MDM: Patient presents with waxing and waning confusion, lethargy, weakness.  Has just completed course of antibiotics for pneumonia.  Chest x-ray positive for worsening left pleural effusion of unknown etiology.  Patient denies shortness of breath and is not hypoxic.   There is no leukocytosis.  Does not meet sepsis criteria, lactic acid deferred.  He has new onset bowel incontinence since around 1/1 and bladder scan 680 mL - Foley catheter was inserted and urinalysis with no evidence of infection, however concentrated with ketones.  Not suspecting cauda equina at this time. Unclear why patient is retaining urine.  Abdomen is distended, however not suspected bowel obstruction at this time.  CT performed on 1/4 unremarkable.  Vitals have been stable and unremarkable aside from hypertension.  For encephalopathy, currently, low suspicion for stroke or brain bleed.  Patient is lucid in room but wife had reported differently at home.  Deferring CT head at this time as symptoms are not consistent with CVA or brain bleed.  For the hyperglycemia, BMP remarkable for anion gap of 20, Chloride 96, CO2 18 for concern of DKA with anion gap metabolic acidosis.  Admitting physician stated he would put in orders for DKA protocol.  Repeat sodium chloride  bolus ordered.  I discussed this case with my attending physician Dr. Cottie who cosigned this note including patient's presenting symptoms, physical exam, and planned diagnostics and interventions. Attending physician stated agreement with plan or made changes to plan which were implemented.      Amount and/or Complexity of Data Reviewed Labs: ordered. Radiology: ordered.  Risk OTC drugs. Prescription drug management. Decision regarding hospitalization.       Final diagnoses:  None    ED Discharge Orders     None          Baili Stang, [place],  DO 09/22/24 1347    Cottie Donnice PARAS, MD 09/22/24 1705  "

## 2024-09-22 NOTE — ED Notes (Signed)
Bladder scan showed 680 ml.

## 2024-09-22 NOTE — ED Triage Notes (Addendum)
 Pt BIB EMS from home c/o worsening lethargy and weakness over the past day. On the 1st he was d/x w pneumonia-finished his last dose of of abx today. Elevated sugars. Decreased end tidal at 25. Received 150ml NS and 400ml LR. Pt complains of thirst and urinates a lot. Usually AO4, but slow to respond.   EMS vitals  BP 180/80 HR 85 SPO2 88 on RA, but currently 94 on RA. He fluctuates, not on O2 at home   CBG 424

## 2024-09-22 NOTE — ED Notes (Signed)
 Patient was unable to provide urine sample with urinal. Nurse notified.

## 2024-09-23 ENCOUNTER — Other Ambulatory Visit (HOSPITAL_COMMUNITY): Payer: Self-pay

## 2024-09-23 ENCOUNTER — Telehealth (HOSPITAL_COMMUNITY): Payer: Self-pay

## 2024-09-23 DIAGNOSIS — G9341 Metabolic encephalopathy: Secondary | ICD-10-CM | POA: Diagnosis not present

## 2024-09-23 DIAGNOSIS — E119 Type 2 diabetes mellitus without complications: Secondary | ICD-10-CM

## 2024-09-23 DIAGNOSIS — J90 Pleural effusion, not elsewhere classified: Secondary | ICD-10-CM | POA: Diagnosis not present

## 2024-09-23 DIAGNOSIS — R338 Other retention of urine: Secondary | ICD-10-CM

## 2024-09-23 DIAGNOSIS — E1110 Type 2 diabetes mellitus with ketoacidosis without coma: Secondary | ICD-10-CM | POA: Diagnosis not present

## 2024-09-23 DIAGNOSIS — R339 Retention of urine, unspecified: Secondary | ICD-10-CM | POA: Diagnosis not present

## 2024-09-23 LAB — BASIC METABOLIC PANEL WITH GFR
Anion gap: 12 (ref 5–15)
Anion gap: 10 (ref 5–15)
Anion gap: 10 (ref 5–15)
Anion gap: 10 (ref 5–15)
BUN: 13 mg/dL (ref 8–23)
BUN: 12 mg/dL (ref 8–23)
BUN: 10 mg/dL (ref 8–23)
BUN: 9 mg/dL (ref 8–23)
CO2: 20 mmol/L — ABNORMAL LOW (ref 22–32)
CO2: 20 mmol/L — ABNORMAL LOW (ref 22–32)
CO2: 22 mmol/L (ref 22–32)
CO2: 21 mmol/L — ABNORMAL LOW (ref 22–32)
Calcium: 9.7 mg/dL (ref 8.9–10.3)
Calcium: 9.3 mg/dL (ref 8.9–10.3)
Calcium: 9.6 mg/dL (ref 8.9–10.3)
Calcium: 9.4 mg/dL (ref 8.9–10.3)
Chloride: 103 mmol/L (ref 98–111)
Chloride: 106 mmol/L (ref 98–111)
Chloride: 104 mmol/L (ref 98–111)
Chloride: 104 mmol/L (ref 98–111)
Creatinine, Ser: 0.83 mg/dL (ref 0.61–1.24)
Creatinine, Ser: 0.76 mg/dL (ref 0.61–1.24)
Creatinine, Ser: 0.71 mg/dL (ref 0.61–1.24)
Creatinine, Ser: 0.75 mg/dL (ref 0.61–1.24)
GFR, Estimated: >60 mL/min
GFR, Estimated: >60 mL/min
GFR, Estimated: >60 mL/min
GFR, Estimated: >60 mL/min
Glucose, Bld: 176 mg/dL — ABNORMAL HIGH (ref 70–99)
Glucose, Bld: 155 mg/dL — ABNORMAL HIGH (ref 70–99)
Glucose, Bld: 186 mg/dL — ABNORMAL HIGH (ref 70–99)
Glucose, Bld: 237 mg/dL — ABNORMAL HIGH (ref 70–99)
Potassium: 3.8 mmol/L (ref 3.5–5.1)
Potassium: 3.6 mmol/L (ref 3.5–5.1)
Potassium: 4 mmol/L (ref 3.5–5.1)
Potassium: 3.8 mmol/L (ref 3.5–5.1)
Sodium: 135 mmol/L (ref 135–145)
Sodium: 136 mmol/L (ref 135–145)
Sodium: 136 mmol/L (ref 135–145)
Sodium: 135 mmol/L (ref 135–145)

## 2024-09-23 LAB — GLUCOSE, CAPILLARY
Glucose-Capillary: 139 mg/dL — ABNORMAL HIGH (ref 70–99)
Glucose-Capillary: 149 mg/dL — ABNORMAL HIGH (ref 70–99)
Glucose-Capillary: 151 mg/dL — ABNORMAL HIGH (ref 70–99)
Glucose-Capillary: 154 mg/dL — ABNORMAL HIGH (ref 70–99)
Glucose-Capillary: 163 mg/dL — ABNORMAL HIGH (ref 70–99)
Glucose-Capillary: 165 mg/dL — ABNORMAL HIGH (ref 70–99)
Glucose-Capillary: 167 mg/dL — ABNORMAL HIGH (ref 70–99)
Glucose-Capillary: 168 mg/dL — ABNORMAL HIGH (ref 70–99)
Glucose-Capillary: 169 mg/dL — ABNORMAL HIGH (ref 70–99)
Glucose-Capillary: 176 mg/dL — ABNORMAL HIGH (ref 70–99)
Glucose-Capillary: 177 mg/dL — ABNORMAL HIGH (ref 70–99)
Glucose-Capillary: 182 mg/dL — ABNORMAL HIGH (ref 70–99)
Glucose-Capillary: 182 mg/dL — ABNORMAL HIGH (ref 70–99)
Glucose-Capillary: 188 mg/dL — ABNORMAL HIGH (ref 70–99)
Glucose-Capillary: 213 mg/dL — ABNORMAL HIGH (ref 70–99)

## 2024-09-23 LAB — GLUCOSE, BODY FLUID OTHER
Glucose, Body Fluid Other: 326 mg/dL
Source of Sample: 19497

## 2024-09-23 LAB — HEMOGLOBIN A1C
Hgb A1c MFr Bld: 13.2 % — ABNORMAL HIGH (ref 4.8–5.6)
Mean Plasma Glucose: 332.14 mg/dL

## 2024-09-23 LAB — LD, BODY FLUID (OTHER)
LD, Body Fluid: 639 IU/L
Source of Sample: 100156

## 2024-09-23 LAB — BETA-HYDROXYBUTYRIC ACID
Beta-Hydroxybutyric Acid: 0.8 mmol/L — ABNORMAL HIGH (ref 0.05–0.27)
Beta-Hydroxybutyric Acid: 0.14 mmol/L (ref 0.05–0.27)

## 2024-09-23 LAB — STREP PNEUMONIAE URINARY ANTIGEN: Strep Pneumo Urinary Antigen: NEGATIVE

## 2024-09-23 LAB — PROTEIN, BODY FLUID (OTHER)
Source of Sample: 19588
Total Protein, Body Fluid Other: 4.7 g/dL

## 2024-09-23 LAB — MRSA NEXT GEN BY PCR, NASAL: MRSA by PCR Next Gen: NOT DETECTED

## 2024-09-23 MED ORDER — DULOXETINE HCL 60 MG PO CPEP
60.0000 mg | ORAL_CAPSULE | Freq: Every day | ORAL | Status: DC
  Administered 2024-09-23 – 2024-10-02 (×10): 60 mg via ORAL
  Filled 2024-09-23 (×2): qty 2
  Filled 2024-09-23 (×3): qty 1
  Filled 2024-09-23: qty 2
  Filled 2024-09-23 (×2): qty 1
  Filled 2024-09-23 (×2): qty 2

## 2024-09-23 MED ORDER — PRIMIDONE 250 MG PO TABS
250.0000 mg | ORAL_TABLET | Freq: Two times a day (BID) | ORAL | Status: DC
  Administered 2024-09-23 – 2024-10-03 (×20): 250 mg via ORAL
  Filled 2024-09-23 (×23): qty 1

## 2024-09-23 MED ORDER — INSULIN GLARGINE 100 UNIT/ML ~~LOC~~ SOLN
15.0000 [IU] | Freq: Every day | SUBCUTANEOUS | Status: DC
  Filled 2024-09-23: qty 0.15

## 2024-09-23 MED ORDER — PRIMIDONE 50 MG PO TABS
50.0000 mg | ORAL_TABLET | Freq: Every day | ORAL | Status: DC
  Administered 2024-09-23 – 2024-10-03 (×11): 50 mg via ORAL
  Filled 2024-09-23 (×11): qty 1

## 2024-09-23 MED ORDER — SODIUM CHLORIDE 0.9 % IV SOLN
500.0000 mg | INTRAVENOUS | Status: DC
  Administered 2024-09-23: 500 mg via INTRAVENOUS
  Filled 2024-09-23 (×2): qty 5

## 2024-09-23 MED ORDER — CHLORHEXIDINE GLUCONATE CLOTH 2 % EX PADS
6.0000 | MEDICATED_PAD | Freq: Every day | CUTANEOUS | Status: DC
  Administered 2024-09-23 – 2024-10-03 (×11): 6 via TOPICAL

## 2024-09-23 MED ORDER — INSULIN ASPART 100 UNIT/ML IJ SOLN
0.0000 [IU] | Freq: Every day | INTRAMUSCULAR | Status: DC
  Administered 2024-09-23: 2 [IU] via SUBCUTANEOUS
  Administered 2024-09-24: 3 [IU] via SUBCUTANEOUS
  Filled 2024-09-23: qty 2
  Filled 2024-09-23: qty 3

## 2024-09-23 MED ORDER — AZITHROMYCIN 250 MG PO TABS
500.0000 mg | ORAL_TABLET | Freq: Every day | ORAL | Status: DC
  Administered 2024-09-24: 500 mg via ORAL
  Filled 2024-09-23: qty 2

## 2024-09-23 MED ORDER — LEVOTHYROXINE SODIUM 25 MCG PO TABS
137.0000 ug | ORAL_TABLET | Freq: Every day | ORAL | Status: DC
  Administered 2024-09-24 – 2024-10-03 (×10): 137 ug via ORAL
  Filled 2024-09-23 (×10): qty 1

## 2024-09-23 MED ORDER — SODIUM CHLORIDE 0.9 % IV SOLN
2.0000 g | INTRAVENOUS | Status: DC
  Administered 2024-09-23 – 2024-09-24 (×2): 2 g via INTRAVENOUS
  Filled 2024-09-23 (×2): qty 20

## 2024-09-23 MED ORDER — INSULIN ASPART 100 UNIT/ML IJ SOLN
0.0000 [IU] | Freq: Three times a day (TID) | INTRAMUSCULAR | Status: DC
  Administered 2024-09-23: 3 [IU] via SUBCUTANEOUS
  Administered 2024-09-24 (×2): 8 [IU] via SUBCUTANEOUS
  Administered 2024-09-24: 3 [IU] via SUBCUTANEOUS
  Filled 2024-09-23: qty 3
  Filled 2024-09-23: qty 2
  Filled 2024-09-23 (×2): qty 8

## 2024-09-23 MED ORDER — ENOXAPARIN SODIUM 40 MG/0.4ML IJ SOSY
40.0000 mg | PREFILLED_SYRINGE | INTRAMUSCULAR | Status: DC
  Administered 2024-09-23 – 2024-10-03 (×11): 40 mg via SUBCUTANEOUS
  Filled 2024-09-23 (×11): qty 0.4

## 2024-09-23 MED ORDER — POTASSIUM CHLORIDE CRYS ER 20 MEQ PO TBCR
40.0000 meq | EXTENDED_RELEASE_TABLET | Freq: Once | ORAL | Status: AC
  Administered 2024-09-23: 40 meq via ORAL
  Filled 2024-09-23: qty 2

## 2024-09-23 MED ORDER — MIRABEGRON ER 25 MG PO TB24
25.0000 mg | ORAL_TABLET | Freq: Every day | ORAL | Status: DC
  Administered 2024-09-23 – 2024-10-03 (×11): 25 mg via ORAL
  Filled 2024-09-23 (×12): qty 1

## 2024-09-23 MED ORDER — ORAL CARE MOUTH RINSE: 15.0000 mL | OROMUCOSAL | Status: DC | PRN

## 2024-09-23 MED ORDER — INSULIN GLARGINE 100 UNIT/ML ~~LOC~~ SOLN
15.0000 [IU] | Freq: Every day | SUBCUTANEOUS | Status: DC
  Administered 2024-09-23 – 2024-09-24 (×2): 15 [IU] via SUBCUTANEOUS
  Filled 2024-09-23 (×2): qty 0.15

## 2024-09-23 NOTE — Plan of Care (Signed)
  Problem: Education: Goal: Ability to describe self-care measures that may prevent or decrease complications (Diabetes Survival Skills Education) will improve Outcome: Progressing Goal: Individualized Educational Video(s) Outcome: Progressing   Problem: Coping: Goal: Ability to adjust to condition or change in health will improve Outcome: Progressing   Problem: Nutritional: Goal: Maintenance of adequate nutrition will improve Outcome: Progressing Goal: Progress toward achieving an optimal weight will improve Outcome: Progressing   Problem: Skin Integrity: Goal: Risk for impaired skin integrity will decrease Outcome: Progressing

## 2024-09-23 NOTE — Progress Notes (Signed)
 Pharmacy: Antimicrobial Stewardship Note  Stewardship team informed of national backorder of IV azithromycin  - sent message to Dr. Jadine, will transition this patient to oral azithromycin . Noted good oral bioavailability and patient able to take po.   Thank you for allowing pharmacy to be a part of this patients care.  Almarie Lunger, PharmD, BCPS, BCIDP Infectious Diseases Clinical Pharmacist 09/23/2024 12:51 PM   **Pharmacist phone directory can now be found on [email or web address] ([email or web address]).  Listed under [facility].

## 2024-09-23 NOTE — Progress Notes (Signed)
 Initial Nutrition Assessment  DOCUMENTATION CODES:   Obesity unspecified  INTERVENTION:  - Clear Liquid diet per MD.  - Provided diabetes education handouts to patient at bedside.    NUTRITION DIAGNOSIS:   Food and nutrition related knowledge deficit related to limited prior education as evidenced by per patient/family report.  GOAL:   Patient will meet greater than or equal to 90% of their needs  MONITOR:   PO intake, Diet advancement, Labs, Weight trends  REASON FOR ASSESSMENT:   Consult Assessment of nutrition requirement/status, Diet education  ASSESSMENT:   79 y.o. man PMH diabetes, history of prostate cancer, who has been sick for about 2 weeks now, presented with DKA and large left pleural effusion.  Patient sleeping at time of visit but awoke to sound of voice.   Reports a UBW of 220# and feels his weight has been stable. Per EMR, weight stable over the past year.  He endorses eating normally over the past several months, usually consumes 3 meals a day.   When inquiring about what patient eats at meals and drinks during the day patient began to fall back asleep. Able to report he usually eats toast with cream cheese and yogurt at breakfast but unable to obtain further history.  RD provided Carbohydrate Counting for People with Diabetes, Using Nutrition Labels: Carbohydrate, and Plate Method for Diabetes handouts from the Academy of [employer] and Dietetics. Encouraged patient to review these handouts once more awake and feeling better and patient did endorse understanding.   Will attempt to obtain further nutrition history and provide further diet education during next visit.   Medications reviewed and include: -  Labs reviewed:  - HA1C 13.2 Blood Glucose 149-354 x24 hours  NUTRITION - FOCUSED PHYSICAL EXAM:  Deferred - will attempt at next visit  Diet Order:   Diet Order             Diet clear liquid Fluid consistency: Thin  Diet effective now                    EDUCATION NEEDS:  Education needs have been addressed  Skin:  Skin Assessment: Reviewed RN Assessment  Last BM:  1/6 - type 6  Height:  Ht Readings from Last 1 Encounters:  09/22/24 5' 10 (1.778 m)   Weight:  Wt Readings from Last 1 Encounters:  09/22/24 108 kg   Ideal Body Weight:  75.45 kg  BMI:  Body mass index is 34.16 kg/m.  Estimated Nutritional Needs:  Kcal:  1800-2000 kcals Protein:  85-95 grams Fluid:  >/= 1.8L    Trude Ned RD, LDN Contact via Secure Chat.

## 2024-09-23 NOTE — Inpatient Diabetes Management (Addendum)
 Inpatient Diabetes Program Recommendations  AACE/ADA: New Consensus Statement on Inpatient Glycemic Control (2015)  Target Ranges:  Prepandial:   less than 140 mg/dL      Peak postprandial:   less than 180 mg/dL (1-2 hours)      Critically ill patients:  140 - 180 mg/dL    Latest Reference Range & Units 09/18/24 08:12 09/22/24 11:07  Glucose 70 - 99 mg/dL 387 (HH) 622 (H)  (HH): Data is critically high (H): Data is abnormally high  Latest Reference Range & Units 09/22/24 16:21  Hemoglobin A1C 4.8 - 5.6 % 13.2 (H)  332 mg/dl  (H): Data is abnormally high  Latest Reference Range & Units 09/22/24 23:42 09/23/24 00:45 09/23/24 02:08 09/23/24 03:18 09/23/24 04:17 09/23/24 05:18 09/23/24 06:19  Glucose-Capillary 70 - 99 mg/dL 820 (H)  IV Insulin  Drip Running 151 (H) 188 (H) 177 (H) 176 (H) 182 (H) 163 (H)  (H): Data is abnormally high    Admit with: DKA/ Large left pleural effusion/ Pneumonia  History: DM  Home DM Meds: Januvia 100 mg at HS        Actos  15 mg daily  Current Orders: IV Insulin  Drip      MD- Note Anion Gap 12/ CO2 level 20 on 3:26am BMET this AM  When you allow pt to transition to SQ Insulin , please consider:  1. Start Lantus  15 units daily (~0.15 units/kg)--Continue IV Insulin  Drip for 2hrs after Lantus  given and then d/c IV Insulin  drip  2. Start Novolog  Moderate Correction Scale/ SSI (0-15 units) TID AC + HS  Current A1c shows very poor glucose control at home--Will pt need insulin  at time of d/c?    Addendum 12pm--Met w/ pt and wife at bedside.  Conversation mostly had with wife as pt was very sleepy--Pt OK with me talking with wife.  Wife told me she is aware pt's A1c has dramatically increased since July--Was tested at PCP office and was 12.1% 09/05/2024--Now up to 13.2% (was 6.8% July 2025).  Wife told me pt had explosive diarrhea with the Metformin--They switched him to XR version and it did not help.  Metformin was stopped about 1 mos ago by PCP and  PCP added Januvia 100 mg daily.  When A1c was so high in Dec, PCP then added Rx to start Amaryl 4 mg daily but wife told me they had trouble getting it and can't get it filled until Jan 15th.  Actos  was also added to home meds on 09/20/2023.  Pt does have CBG meter at home but does not check.  Pt acknowledged to me the need to check CBGs.  Wife expressed interest in CGM for pt for home.  Pt only has a flip phone and I told wife he will either need a new smart phone or will need a reader to use the CGM at home.  Request sent to [facility] to see if CGM covered--Pt will need PA completed for CGM--request made to start PA.  Discussed with wife how multiple factors have probably affected A1c (sickness, dietary indiscretion, etc).  Explained to wife that pt may need insulin  (at least in the short term) to get his CBGs down to healthy and safe levels.  Wife agreeable if needed.  Discussed with wife that MD will ultimately make final decision and our team will help with insulin  pen edu if needed at d/c.    --Will follow patient during hospitalization--  Adina Rudolpho Arrow RN, MSN, CDCES Diabetes Coordinator Inpatient Glycemic Control  Team Team Pager: [phone] (8a-5p)

## 2024-09-23 NOTE — Progress Notes (Signed)
 Lantus  given per orders at 1425. Insulin  gtt and associated IVF stopped at 1619 per protocol. RN will continue to closely monitor CBGs and administer SSI as ordered.

## 2024-09-23 NOTE — Progress Notes (Addendum)
" °  Progress Note   Patient: Kevin Olson FMW:[id number] DOB: December 30, 1945 DOA: 09/22/2024     1 DOS: the patient was seen and examined on 09/23/2024   Brief hospital course: 79 year old man PMH diabetes, history of prostate cancer, who has been sick for about 2 weeks now, presented with DKA and large left pleural effusion.    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  No leukocytosis but fever overnight. Anion gap acted but CO2 still low, follow beta hydroxybutyric acid, once within normal limits convert to subcutaneous insulin .   Large left pleural effusion Left-sided community-acquired pneumonia recently treated No leukocytosis or fever.  Recently completed antibiotics for pneumonia.  Now has large left pleural effusion.  Oxygen mid 90s. Diagnostic and therapeutic pleurocentesis completed 1/5.  Follow-up studies which are currently pending. Consider chest CT pending fluid analysis. 1 episode of fever, may have been atelectasis, will check CBC in a.m. and monitor off antibiotics.   Acute urinary retention Foley catheter placed in the ED, voiding trial later today if mentation is improved  Class 1 obesity: BMI 30.0-34.9 kg/m Body mass index is 34.16 kg/m. Dietician consult     Subjective:  Feels okay today, breathing okay.  Complains of being generally weak.  Physical Exam: Vitals:   09/23/24 0320 09/23/24 0600 09/23/24 0700 09/23/24 0800  BP:   (!) 131/40 (!) 156/52  Pulse:   64 64  Resp:   (!) 28 (!) 28  Temp: (!) 101.9 F (38.8 C) 98.6 F (37 C)    TempSrc: Axillary Oral    SpO2:   94% 95%  Weight:      Height:      Fever overnight 101.9 Physical Exam Vitals  and nursing note reviewed.  Constitutional:      General: He is not in acute distress.    Appearance: He is not ill-appearing or toxic-appearing.  Cardiovascular:     Rate and Rhythm: Normal rate and regular rhythm.     Heart sounds: No murmur heard. Pulmonary:     Effort: Pulmonary effort is normal. No respiratory distress.     Breath sounds: No wheezing, rhonchi or rales.  Musculoskeletal:     Right lower leg: No edema.     Left lower leg: No edema.  Neurological:     Mental Status: He is alert.  Psychiatric:        Mood and Affect: Mood normal.        Behavior: Behavior normal.     Data Reviewed: CBG stable Anion gap is normalized at 12, CO2 slightly low at 20 Beta hydroxybutyric acid still elevated at 0.8 on last check, but repeat is pending  Family Communication: Nonenone  Disposition: Status is: Inpatient Remains inpatient appropriate because: DKA     Time spent: 35 1/6 minutes  Author: Toribio Door, MD 09/23/2024 8:46 AM  For on call review [email or web address].    "

## 2024-09-24 ENCOUNTER — Inpatient Hospital Stay (HOSPITAL_COMMUNITY)

## 2024-09-24 ENCOUNTER — Other Ambulatory Visit (HOSPITAL_COMMUNITY): Payer: Self-pay

## 2024-09-24 ENCOUNTER — Telehealth (HOSPITAL_COMMUNITY): Payer: Self-pay

## 2024-09-24 ENCOUNTER — Other Ambulatory Visit: Payer: Self-pay

## 2024-09-24 DIAGNOSIS — Z9889 Other specified postprocedural states: Secondary | ICD-10-CM | POA: Diagnosis not present

## 2024-09-24 DIAGNOSIS — J869 Pyothorax without fistula: Secondary | ICD-10-CM

## 2024-09-24 DIAGNOSIS — J90 Pleural effusion, not elsewhere classified: Secondary | ICD-10-CM | POA: Diagnosis not present

## 2024-09-24 DIAGNOSIS — E1110 Type 2 diabetes mellitus with ketoacidosis without coma: Secondary | ICD-10-CM | POA: Diagnosis not present

## 2024-09-24 LAB — LEGIONELLA PNEUMOPHILA SEROGP 1 UR AG: L. pneumophila Serogp 1 Ur Ag: NEGATIVE

## 2024-09-24 LAB — HEPATIC FUNCTION PANEL
ALT: 43 U/L (ref 0–44)
AST: 93 U/L — ABNORMAL HIGH (ref 15–41)
Albumin: 2.7 g/dL — ABNORMAL LOW (ref 3.5–5.0)
Alkaline Phosphatase: 165 U/L — ABNORMAL HIGH (ref 38–126)
Bilirubin, Direct: 0.1 mg/dL (ref 0.0–0.2)
Indirect Bilirubin: 0.2 mg/dL — ABNORMAL LOW (ref 0.3–0.9)
Total Bilirubin: 0.3 mg/dL (ref 0.0–1.2)
Total Protein: 6.6 g/dL (ref 6.5–8.1)

## 2024-09-24 LAB — CBC
HCT: 32.4 % — ABNORMAL LOW (ref 39.0–52.0)
Hemoglobin: 11.3 g/dL — ABNORMAL LOW (ref 13.0–17.0)
MCH: 33.2 pg (ref 26.0–34.0)
MCHC: 34.9 g/dL (ref 30.0–36.0)
MCV: 95.3 fL (ref 80.0–100.0)
Platelets: 328 K/uL (ref 150–400)
RBC: 3.4 MIL/uL — ABNORMAL LOW (ref 4.22–5.81)
RDW: 12.4 % (ref 11.5–15.5)
WBC: 7.6 K/uL (ref 4.0–10.5)
nRBC: 0 % (ref 0.0–0.2)

## 2024-09-24 LAB — C DIFFICILE QUICK SCREEN W PCR REFLEX
C Diff antigen: NEGATIVE
C Diff interpretation: NOT DETECTED
C Diff toxin: NEGATIVE

## 2024-09-24 LAB — CYTOLOGY - NON PAP

## 2024-09-24 LAB — GLUCOSE, CAPILLARY
Glucose-Capillary: 211 mg/dL — ABNORMAL HIGH (ref 70–99)
Glucose-Capillary: 281 mg/dL — ABNORMAL HIGH (ref 70–99)
Glucose-Capillary: 270 mg/dL — ABNORMAL HIGH (ref 70–99)
Glucose-Capillary: 279 mg/dL — ABNORMAL HIGH (ref 70–99)

## 2024-09-24 LAB — LACTATE DEHYDROGENASE: LDH: 134 U/L (ref 105–235)

## 2024-09-24 MED ORDER — PANTOPRAZOLE SODIUM 40 MG PO TBEC
40.0000 mg | DELAYED_RELEASE_TABLET | Freq: Two times a day (BID) | ORAL | Status: DC
  Administered 2024-09-24 – 2024-10-03 (×19): 40 mg via ORAL
  Filled 2024-09-24 (×19): qty 1

## 2024-09-24 MED ORDER — PIPERACILLIN-TAZOBACTAM 3.375 G IVPB
3.3750 g | Freq: Three times a day (TID) | INTRAVENOUS | Status: DC
  Administered 2024-09-24 – 2024-10-02 (×24): 3.375 g via INTRAVENOUS
  Filled 2024-09-24 (×24): qty 50

## 2024-09-24 MED ORDER — INSULIN GLARGINE 100 UNIT/ML ~~LOC~~ SOLN
18.0000 [IU] | Freq: Every day | SUBCUTANEOUS | Status: DC
  Filled 2024-09-24: qty 0.18

## 2024-09-24 MED ORDER — ATORVASTATIN CALCIUM 10 MG PO TABS
10.0000 mg | ORAL_TABLET | Freq: Every day | ORAL | Status: DC
  Administered 2024-09-24 – 2024-10-02 (×9): 10 mg via ORAL
  Filled 2024-09-24 (×9): qty 1

## 2024-09-24 MED ORDER — IRBESARTAN 75 MG PO TABS
37.5000 mg | ORAL_TABLET | Freq: Every day | ORAL | Status: DC
  Administered 2024-09-24 – 2024-10-03 (×10): 37.5 mg via ORAL
  Filled 2024-09-24 (×3): qty 1
  Filled 2024-09-24: qty 0.5
  Filled 2024-09-24: qty 1
  Filled 2024-09-24 (×4): qty 0.5
  Filled 2024-09-24: qty 1
  Filled 2024-09-24: qty 0.5

## 2024-09-24 MED ORDER — SODIUM CHLORIDE 0.9 % IV SOLN: 3.0000 g | Freq: Four times a day (QID) | INTRAVENOUS | Status: DC

## 2024-09-24 MED ORDER — TAMSULOSIN HCL 0.4 MG PO CAPS
0.8000 mg | ORAL_CAPSULE | Freq: Every day | ORAL | Status: DC
  Administered 2024-09-24 – 2024-10-02 (×9): 0.8 mg via ORAL
  Filled 2024-09-24 (×9): qty 2

## 2024-09-24 NOTE — Inpatient Diabetes Management (Addendum)
 Inpatient Diabetes Program Recommendations  AACE/ADA: New Consensus Statement on Inpatient Glycemic Control (2015)  Target Ranges:  Prepandial:   less than 140 mg/dL      Peak postprandial:   less than 180 mg/dL (1-2 hours)      Critically ill patients:  140 - 180 mg/dL   Lab Results  Component Value Date   GLUCAP 211 (H) 09/24/2024   HGBA1C 13.2 (H) 09/22/2024    Review of Glycemic Control  Latest Reference Range & Units 09/23/24 16:19 09/23/24 21:24 09/24/24 07:47  Glucose-Capillary 70 - 99 mg/dL 817 (H) 786 (H) 788 (H)  (H): Data is abnormally high  Diabetes history: DM Outpatient Diabetes medications:  Januvia 100 mg at bedtime Actos  15 mg QD Current orders for Inpatient glycemic control:  Semglee  15 units at bedtime Novolog  0-15 units TID and 0-5 units QHS   Inpatient Diabetes Program Recommendations:    Please consider increasing basal insulin  today :  Semglee  18 units every day  Met with patient and spouse today at bedside.  Discussed the need for insulin  at discharge.  The Freestyle Libre 3 PA is pending.  Will also ask for benefit check on insulins.  Introduced the insulin  pen.  Ordered LWWDM and insulin  starter kit.  Will follow and see again tomorrow.   Thank you, Wyvonna Pinal, MSN, CDCES Diabetes Coordinator Inpatient Diabetes Program [phone] (team pager from 8a-5p)

## 2024-09-24 NOTE — Evaluation (Signed)
 Clinical/Bedside Swallow Evaluation Patient Details  Name: Kevin Olson MRN: [id number] Date of Birth: 1946-05-26  Today's Date: 09/24/2024 Time: SLP Start Time (ACUTE ONLY): 1017 SLP Stop Time (ACUTE ONLY): 1040 SLP Time Calculation (min) (ACUTE ONLY): 23 min  Past Medical History:  Past Medical History:  Diagnosis Date   Depression    DM2 (diabetes mellitus, type 2) (HCC)    Dyslipidemia    Family history of brain cancer    Family history of pancreatic cancer    Hypertension    OA (osteoarthritis) of knee    Prostate cancer (HCC)    prostate   Past Surgical History:  Past Surgical History:  Procedure Laterality Date   BIOPSY  03/19/2022   Procedure: BIOPSY;  Surgeon: Rosalie Kitchens, MD;  Location: [facility];  Service: Gastroenterology;;   ORIN MEDIATE RELEASE Right 12/05/2023   Procedure: CARPAL TUNNEL RELEASE;  Surgeon: Romona Harari, MD;  Location: [facility];  Service: Orthopedics;  Laterality: Right;   ESOPHAGOGASTRODUODENOSCOPY (EGD) WITH PROPOFOL  N/A 03/19/2022   Procedure: ESOPHAGOGASTRODUODENOSCOPY (EGD) WITH PROPOFOL ;  Surgeon: Rosalie Kitchens, MD;  Location: [facility];  Service: Gastroenterology;  Laterality: N/A;   INGUINAL HERNIA REPAIR     IR THORACENTESIS ASP PLEURAL SPACE W/IMG GUIDE  09/22/2024   PROSTATE BIOPSY     TOTAL KNEE ARTHROPLASTY Left 10/26/2020   Procedure: TOTAL KNEE ARTHROPLASTY;  Surgeon: Ernie Cough, MD;  Location: [facility];  Service: Orthopedics;  Laterality: Left;  70 mins   TOTAL KNEE ARTHROPLASTY Right 12/21/2020   Procedure: TOTAL KNEE ARTHROPLASTY;  Surgeon: Ernie Cough, MD;  Location: [facility];  Service: Orthopedics;  Laterality: Right;  70 mins   HPI:  Kevin Olson is a 79 y.o. male who presented to the hospital on 09/22/24 with DKA and large left pleural effusion. He was seen at ED on 1/1 with PNA and was discharged. CXR following thoracentesis reported: improved left lung aeration, left mid-lower lung airspace disease, right lung  is clear. PMH: GERD, DM-2, HTN, OA, depression.    Assessment / Plan / Recommendation  Clinical Impression  SLP recommending advance to regular texture solids, continue with thin liquids and no f/u warranted at this time.  Kevin Olson is not currently presenting with clinical s/s of dysphagia as per this bedside swallow evaluation. He denies any h/o dysphagia other than when he gets too full (consistent with his h/o GERD). No overt s/s aspiration observed with PO's of thin liquids, puree solids, regular solids.  SLP Visit Diagnosis: Dysphagia, unspecified (R13.10)    Aspiration Risk  No limitations    Diet Recommendation Regular;Thin liquid    Medication Administration: Other (Comment) (as tolerated) Supervision: Patient able to self feed Compensations: Slow rate;Small sips/bites Postural Changes: Seated upright at 90 degrees;Remain upright for at least 30 minutes after po intake    Other Recommendations Oral Care Recommendations: Oral care BID     Swallow Evaluation Recommendations     Assistance Recommended at Discharge    Functional Status Assessment Patient has not had a recent decline in their functional status  Frequency and Duration   N/A         Prognosis    N/A    Swallow Study   General Date of Onset: 09/23/24 HPI: Kevin Olson is a 79 y.o. male who presented to the hospital on 09/22/24 with DKA and large left pleural effusion. He was seen at ED on 1/1 with PNA and was discharged. CXR following thoracentesis reported: improved left lung aeration, left mid-lower  lung airspace disease, right lung is clear. PMH: GERD, DM-2, HTN, OA, depression. Type of Study: Bedside Swallow Evaluation Previous Swallow Assessment: none found Diet Prior to this Study: Dysphagia 3 (mechanical soft);Thin liquids (Level 0) Temperature Spikes Noted: Yes (100.2 1/7 0741) Respiratory Status: Room air History of Recent Intubation: No Behavior/Cognition: Alert;Cooperative;Pleasant mood Oral  Care Completed by SLP: No Oral Cavity - Dentition: Adequate natural dentition Vision: Functional for self-feeding Self-Feeding Abilities: Able to feed self;Needs set up Patient Positioning: Upright in bed Baseline Vocal Quality: Normal Volitional Cough: Strong Volitional Swallow: Able to elicit    Oral/Motor/Sensory Function Overall Oral Motor/Sensory Function: Within functional limits   Ice Chips     Thin Liquid Thin Liquid: Within functional limits Presentation: Straw;Self Fed    Nectar Thick     Honey Thick     Puree Puree: Within functional limits Presentation: Spoon;Self Fed   Solid     Solid: Within functional limits Presentation: Self Fed      Norleen IVAR Blase, MA, CCC-SLP Speech Therapy  09/24/2024,11:23 AM

## 2024-09-24 NOTE — Telephone Encounter (Signed)
 Pharmacy Patient Advocate Encounter  Received notification from [employer] that Prior Authorization for FreeStyle Libre 3 Plus Sensor has been APPROVED from 09/18/24 to 09/17/25. Ran test claim, Copay is $0. This test claim was processed through [facility]- copay amounts may vary at other pharmacies due to pharmacy/plan contracts, or as the patient moves through the different stages of their insurance plan.   PA #/Case ID/Reference #: [id number]

## 2024-09-24 NOTE — Plan of Care (Signed)
" °  Problem: Education: Goal: Individualized Educational Video(s) Outcome: Progressing   Problem: Health Behavior/Discharge Planning: Goal: Ability to manage health-related needs will improve Outcome: Progressing   Problem: Metabolic: Goal: Ability to maintain appropriate glucose levels will improve Outcome: Progressing   Problem: Skin Integrity: Goal: Risk for impaired skin integrity will decrease Outcome: Progressing   Problem: Tissue Perfusion: Goal: Adequacy of tissue perfusion will improve Outcome: Progressing   "

## 2024-09-24 NOTE — Progress Notes (Signed)
 " PROGRESS NOTE Kevin Olson  FMW:[id number] DOB: 1945-10-01 DOA: 09/22/2024 PCP: Nanci Senior, MD  Brief Narrative/Hospital Course: Kevin Olson is a 79 y.o. male with PMH of diabetes, history of prostate cancer, who has been sick for about 2 week, presented to the ED and found to have with DKA and large left pleural effusion and admitted on 1/5 and s/p left thoracentesis 50 cc yellow fluid, complex on ultrasound. Recent ED visit 1/1-diagnosed with pneumonia and discharged, also noted to be hyperglycemic with high anion gap at that time.  CT abdomen pelvis with contrast on 1/1 no acute finding.  Subjective: Seen and examined today He is aaox3, no chest pain on RA Overnight no fever but Tmax one 101 .3 at 2 PM yesterday, VSS, Labs-blood sugar 102-2 1 3  normal gap bicarb 21  Assessment and plan:   DKA T2DM , non-insulin -dependent, with neuropathy, uncontrolled while resuming Acute metabolic encephalopathy secondary to DKA-resolved: PTA on Januvia and Actos .  Initially treated left-sided pneumonia-likely the trigger for DKA.  A1c 13.2 indicating poor control at baseline. DKA resolved, patient now on Lantus   increase 15> 18 u cont SSI 0-15 units.  DM coordinator following - he endorses he can manage insulin  at home. He will need home insulin  will discharge, along with insulin  teaching. Recent Labs  Lab 09/22/24 1621 09/22/24 1733 09/23/24 1313 09/23/24 1418 09/23/24 1619 09/23/24 2124 09/24/24 0747  GLUCAP  --    < > 154* 165* 182* 213* 211*  HGBA1C 13.2*  --   --   --   --   --   --    < > = values in this interval not displayed.    Large left pleural effusion Left-sided community-acquired pneumonia recently treated No leukocytosis, but noted fever episode 1/6 afternoon.Recently completed antibiotics for pneumonia.large left pleural effusion on imaging had 50 cc thoracentesis done 1/5-Gram stain no organism, culture pending, blood culture NGTD. fluid LDH 639 - appears exudative as  ldh > 200, checked serum LDH - 134 serum protein 6.6 pleural fluid protein 4.7  CT chest ordered as it appears exudative. Now on RA.low threshold for antibiotics/ PCCM eval  Hyperlipidemia: Resume Lipitor 10.  On Repatha  as outpatient.  Hypertension: PTA valsartan bedtime, BP trending up will resume  Chronic anemia on iron supplement: Resume iron supplement  Hypothyroidism: Continue home Synthroid   On chronic oxycodone /Cymbalta  primidone : Continue same  Acute urinary retention/ OAB W/ incontinence at baseline: Foley catheter placed in the ED. TOV THSI AM, resume Flomax , Cont Gemtesa    Class I Obesity w/ Body mass index is 34.16 kg/m.: Will benefit with PCP follow-up, weight loss,healthy lifestyle and outpatient sleep eval if not done.  Mobility: PT Orders: Active PT Follow up Rec:     DVT prophylaxis: enoxaparin  (LOVENOX ) injection 40 mg Start: 09/23/24 1200 SCDs Start: 09/22/24 1444 Code Status:   Code Status: Limited: Do not attempt resuscitation (DNR) -DNR-LIMITED -Do Not Intubate/DNI  Family Communication: plan of care discussed with patient at bedside. Patient status is: Remains hospitalized because of severity of illness Level of care: Stepdown   Dispo: The patient is from: home w/ wife            Anticipated disposition: TBD-PT OT ordered Objective: Vitals last 24 hrs: Vitals:   09/24/24 0600 09/24/24 0700 09/24/24 0740 09/24/24 0741  BP: (!) 151/57 (!) 178/65    Pulse: (!) 59 64    Resp: (!) 22 (!) 22    Temp:   97.9 F (36.6 C) 100.2  F (37.9 C)  TempSrc:   Oral Axillary  SpO2: 91% 91%    Weight:      Height:        Physical Examination: General exam: alert awake, oriented, older than stated age HEENT:Oral mucosa moist, Ear/Nose WNL grossly Respiratory system: Bilaterally clear BS,no use of accessory muscle Cardiovascular system: S1 & S2 +, No JVD. Gastrointestinal system: Abdomen soft,NT,ND, BS+ Nervous System: Alert, awake, moving all  extremities,and following commands. Extremities: extremities warm, leg edema neg Skin: Warm, no rashes MSK: Normal muscle bulk,tone, power  FOLEY+  Medications reviewed:  Scheduled Meds:  atorvastatin   10 mg Oral QHS   azithromycin   500 mg Oral Daily   Chlorhexidine  Gluconate Cloth  6 each Topical Daily   DULoxetine   60 mg Oral QHS   enoxaparin  (LOVENOX ) injection  40 mg Subcutaneous Q24H   insulin  aspart  0-15 Units Subcutaneous TID WC   insulin  aspart  0-5 Units Subcutaneous QHS   insulin  glargine  15 Units Subcutaneous Daily   irbesartan   37.5 mg Oral Daily   levothyroxine   137 mcg Oral QAC breakfast   mirabegron  ER  25 mg Oral q1800   pantoprazole   40 mg Oral BID   primidone   250 mg Oral BID   primidone   50 mg Oral Q1200   sodium chloride  flush  3 mL Intravenous Q12H   tamsulosin   0.8 mg Oral QHS   Continuous Infusions:  cefTRIAXone  (ROCEPHIN )  IV 2 g (09/24/24 1052)   Diet: Diet Order             Diet Carb Modified Room service appropriate? Yes  Diet effective now                   Data Reviewed: I have personally reviewed following labs and imaging studies ( see epic result tab) CBC: Recent Labs  Lab 09/18/24 0519 09/22/24 1107 09/24/24 0826  WBC 7.1 9.7 7.6  NEUTROABS  --  8.3*  --   HGB 14.1 12.8* 11.3*  HCT 40.0 38.2* 32.4*  MCV 96.4 98.2 95.3  PLT 278 356 328   CMP: Recent Labs  Lab 09/22/24 2209 09/23/24 0326 09/23/24 0731 09/23/24 1241 09/23/24 1543  NA 137 135 136 136 135  K 3.8 3.8 3.6 4.0 3.8  CL 105 103 106 104 104  CO2 21* 20* 20* 22 21*  GLUCOSE 168* 176* 155* 186* 237*  BUN 16 13 12 10 9   CREATININE 0.89 0.83 0.76 0.71 0.75  CALCIUM  9.3 9.7 9.3 9.6 9.4   GFR: Estimated Creatinine Clearance: 96.7 mL/min (by C-G formula based on SCr of 0.75 mg/dL). Recent Labs  Lab 09/18/24 0812 09/22/24 1107 09/24/24 0826  AST 16 21 93*  ALT 6 9 43  ALKPHOS 134* 93 165*  BILITOT 0.3 0.2 0.3  PROT 7.6 7.7 6.6  ALBUMIN 3.9 3.3* 2.7*     Recent Labs  Lab 09/18/24 0812  LIPASE 29   No results for input(s): AMMONIA in the last 168 hours. Coagulation Profile: No results for input(s): INR, PROTIME in the last 168 hours. Unresulted Labs (From admission, onward)     Start     Ordered   09/25/24 0500  Basic metabolic panel with GFR  Daily,   R     Question:  Specimen collection method  Answer:  Lab=Lab collect   09/24/24 1048   09/25/24 0500  CBC  Daily,   R     Question:  Specimen collection method  Answer:  Lab=Lab collect   09/24/24 1048   09/23/24 0902  Legionella Pneumophila Serogp 1 Ur Ag  Once,   R        09/23/24 0901   09/23/24 0902  Expectorated Sputum Assessment w Gram Stain, Rflx to Resp Cult  Once,   R        09/23/24 0901           Antimicrobials/Microbiology: Anti-infectives (From admission, onward)    Start     Dose/Rate Route Frequency Ordered Stop   09/24/24 1000  azithromycin  (ZITHROMAX ) tablet 500 mg        500 mg Oral Daily 09/23/24 1249 09/28/24 0959   09/23/24 1000  cefTRIAXone  (ROCEPHIN ) 2 g in sodium chloride  0.9 % 100 mL IVPB        2 g 200 mL/hr over 30 Minutes Intravenous Every 24 hours 09/23/24 0901 09/28/24 0959   09/23/24 1000  azithromycin  (ZITHROMAX ) 500 mg in sodium chloride  0.9 % 250 mL IVPB  Status:  Discontinued        500 mg 250 mL/hr over 60 Minutes Intravenous Every 24 hours 09/23/24 0901 09/23/24 1249         Component Value Date/Time   SDES  09/23/2024 1246    BLOOD RIGHT HAND Performed at [facility], [address], [place], [place] [address]    SPECREQUEST  09/23/2024 1246    BOTTLES DRAWN AEROBIC AND ANAEROBIC Blood Culture adequate volume Performed at [facility], [address], [place], [place] [address]    CULT  09/23/2024 1246    NO GROWTH < 12 HOURS Performed at [facility], [address], [place], [place] [address]    REPTSTATUS PENDING 09/23/2024 1246    Procedures:  Mennie LAMY, MD [facility] 09/24/2024, 12:35 PM   "

## 2024-09-24 NOTE — Telephone Encounter (Signed)
 Pharmacy Patient Advocate Encounter  Insurance verification completed.    The patient is insured through [employer]. Patient has Medicare and is not eligible for a copay card, but may be able to apply for patient assistance or Medicare RX Payment Plan (Patient Must reach out to their plan, if eligible for payment plan), if available.    Ran test claim for Lantus  100unit Pen and the current 30 day co-pay is $22.73.  Ran test claim for Novolog  100unit Pen and the current 30 day co-pay is $32.91.  This test claim was processed through [facility]- copay amounts may vary at other pharmacies due to pharmacy/plan contracts, or as the patient moves through the different stages of their insurance plan.

## 2024-09-24 NOTE — Plan of Care (Signed)
" °  Problem: Education: Goal: Ability to describe self-care measures that Kevin Olson prevent or decrease complications (Diabetes Survival Skills Education) will improve Outcome: Progressing   Need for PT/OT evaul ordered patient is very weak, unable to sit on side of bed.  "

## 2024-09-24 NOTE — Consult Note (Signed)
 "  NAME:  Kevin Olson, MRN:  [id number], DOB:  [birth year], LOS: 2 ADMISSION DATE:  09/22/2024, CONSULTATION DATE:  1/7 REFERRING MD:  1/7, CHIEF COMPLAINT:  L empyema   History of Present Illness:  Patient is a 79 yo M  presents to [facility] ED on 1/5 w/ Left pleural effusion and DKA. He is on Januvia and Actos , HbA1c 13.2.  He had urinary retention and Foley catheter was placed. He underwent thoracentesis on 1/5, only 50 cc of yellow fluid was removed . Pleural fluid showed total protein 4.7, LDH 639, 260 cells 92% neutrophils .  Cultures negative He had low-grade fever 100.5 today, CT chest was obtained which showed moderate loculated left pleural effusion with gas collections within the pleural fluid and consolidation of left upper and lower lobes. Hence PCCM consulted.  Patient reports dry cough and left-sided chest pain     Pertinent  Medical History   DMT2, prostate cancer, HTN, HLD    Past Medical History:  Diagnosis Date   Depression    DM2 (diabetes mellitus, type 2) (HCC)    Dyslipidemia    Family history of brain cancer    Family history of pancreatic cancer    Hypertension    OA (osteoarthritis) of knee    Prostate cancer (HCC)    prostate     Significant Hospital Events: Including procedures, antibiotic start and stop dates in addition to other pertinent events     Interim History / Subjective:  Afebrile On room air, no distress  Objective    Blood pressure (!) 172/80, pulse 64, temperature 99.9 F (37.7 C), temperature source Oral, resp. rate (!) 27, height 5' 10 (1.778 m), weight 108 kg, SpO2 97%.        Intake/Output Summary (Last 24 hours) at 09/24/2024 1654 Last data filed at 09/24/2024 1100 Gross per 24 hour  Intake 176.82 ml  Output 2150 ml  Net -1973.18 ml   Filed Weights   09/22/24 1031  Weight: 108 kg    Examination: Gen. Pleasant, obese, in no distress ENT - no pallor, icterus Neck: No JVD, no thyromegaly, no carotid bruits Lungs: no  use of accessory muscles, no dullness to percussion, decreased on left without rales or rhonchi  Cardiovascular: Rhythm regular, heart sounds  normal, no murmurs or gallops, no peripheral edema Musculoskeletal: No deformities, no cyanosis or clubbing , no tremors  Labs show no leukocytosis, normal electrolytes, hyperglycemia Urine strep antigen negative, urine Legionella antigen negative. Bedside ultrasound does not show any drainable fluid pocket  Resolved problem list   Assessment and Plan   Left loculated complex pleural effusion -pockets of air noted within no fluid on CT could be result of recent thoracentesis versus empyema.  He had low-grade fever, no leukocytosis  -Will request CT-guided pigtail placement - He will likely need intrapleural lytics for complete drainage after pigtail placement - Alternative of surgical drainage was discussed and would prefer to avoid - Add Zosyn  while awaiting culture data , since he was treated with Augmentin /azithromycin  as outpatient   Labs   CBC: Recent Labs  Lab 09/18/24 0519 09/22/24 1107 09/24/24 0826  WBC 7.1 9.7 7.6  NEUTROABS  --  8.3*  --   HGB 14.1 12.8* 11.3*  HCT 40.0 38.2* 32.4*  MCV 96.4 98.2 95.3  PLT 278 356 328    Basic Metabolic Panel: Recent Labs  Lab 09/22/24 2209 09/23/24 0326 09/23/24 0731 09/23/24 1241 09/23/24 1543  NA 137 135 136 136 135  K  3.8 3.8 3.6 4.0 3.8  CL 105 103 106 104 104  CO2 21* 20* 20* 22 21*  GLUCOSE 168* 176* 155* 186* 237*  BUN 16 13 12 10 9   CREATININE 0.89 0.83 0.76 0.71 0.75  CALCIUM  9.3 9.7 9.3 9.6 9.4   GFR: Estimated Creatinine Clearance: 96.7 mL/min (by C-G formula based on SCr of 0.75 mg/dL). Recent Labs  Lab 09/18/24 0519 09/22/24 1107 09/24/24 0826  WBC 7.1 9.7 7.6    Liver Function Tests: Recent Labs  Lab 09/18/24 0812 09/22/24 1107 09/24/24 0826  AST 16 21 93*  ALT 6 9 43  ALKPHOS 134* 93 165*  BILITOT 0.3 0.2 0.3  PROT 7.6 7.7 6.6  ALBUMIN 3.9 3.3*  2.7*   Recent Labs  Lab 09/18/24 0812  LIPASE 29   No results for input(s): AMMONIA in the last 168 hours.  ABG    Component Value Date/Time   HCO3 19.6 (L) 09/22/2024 1124   ACIDBASEDEF 6.0 (H) 09/22/2024 1124   O2SAT 30.7 09/22/2024 1124     Coagulation Profile: No results for input(s): INR, PROTIME in the last 168 hours.  Cardiac Enzymes: No results for input(s): CKTOTAL, CKMB, CKMBINDEX, TROPONINI in the last 168 hours.  HbA1C: Hgb A1c MFr Bld  Date/Time Value Ref Range Status  09/22/2024 04:21 PM 13.2 (H) 4.8 - 5.6 % Final    Comment:    (NOTE) Diagnosis of Diabetes The following HbA1c ranges recommended by the American Diabetes Association (ADA) may be used as an aid in the diagnosis of diabetes mellitus.  Hemoglobin             Suggested A1C NGSP%              Diagnosis  <5.7                   Non Diabetic  5.7-6.4                Pre-Diabetic  >6.4                   Diabetic  <7.0                   Glycemic control for                       adults with diabetes.    03/17/2022 05:09 PM 6.3 (H) 4.8 - 5.6 % Final    Comment:    (NOTE) Pre diabetes:          5.7%-6.4%  Diabetes:              >6.4%  Glycemic control for   <7.0% adults with diabetes     CBG: Recent Labs  Lab 09/23/24 1619 09/23/24 2124 09/24/24 0747 09/24/24 1147 09/24/24 1634  GLUCAP 182* 213* 211* 281* 270*    Review of Systems:   Generalized weakness Fall Confusion on admission is resolved Abdominal pain is resolved Dry cough Left-sided chest pain, pleuritic  Past Medical History:  He,  has a past medical history of Depression, DM2 (diabetes mellitus, type 2) (HCC), Dyslipidemia, Family history of brain cancer, Family history of pancreatic cancer, Hypertension, OA (osteoarthritis) of knee, and Prostate cancer (HCC).   Surgical History:   Past Surgical History:  Procedure Laterality Date   BIOPSY  03/19/2022   Procedure: BIOPSY;  Surgeon: Rosalie Kitchens, MD;  Location: [facility];  Service: Gastroenterology;;   CARPAL TUNNEL RELEASE Right 12/05/2023  Procedure: CARPAL TUNNEL RELEASE;  Surgeon: Romona Harari, MD;  Location: [facility];  Service: Orthopedics;  Laterality: Right;   ESOPHAGOGASTRODUODENOSCOPY (EGD) WITH PROPOFOL  N/A 03/19/2022   Procedure: ESOPHAGOGASTRODUODENOSCOPY (EGD) WITH PROPOFOL ;  Surgeon: Rosalie Kitchens, MD;  Location: [facility];  Service: Gastroenterology;  Laterality: N/A;   INGUINAL HERNIA REPAIR     IR THORACENTESIS ASP PLEURAL SPACE W/IMG GUIDE  09/22/2024   PROSTATE BIOPSY     TOTAL KNEE ARTHROPLASTY Left 10/26/2020   Procedure: TOTAL KNEE ARTHROPLASTY;  Surgeon: Ernie Cough, MD;  Location: [facility];  Service: Orthopedics;  Laterality: Left;  70 mins   TOTAL KNEE ARTHROPLASTY Right 12/21/2020   Procedure: TOTAL KNEE ARTHROPLASTY;  Surgeon: Ernie Cough, MD;  Location: [facility];  Service: Orthopedics;  Laterality: Right;  70 mins     Social History:   reports that he quit smoking about 45 years ago. His smoking use included cigarettes. He started smoking about 57 years ago. He has a 24 pack-year smoking history. He has never used smokeless tobacco. He reports current alcohol use. He reports that he does not use drugs.   Family History:  His family history includes CAD in his brother, brother, and mother; Cancer in his cousin, maternal aunt, and maternal uncle; Celiac disease in his maternal aunt and sister; Leukemia in his sister; Lung cancer in his maternal grandfather and paternal grandfather; Other in his sister; Pancreatic cancer (age of onset: 33) in his sister; Spina bifida in his niece. There is no history of Colon cancer or Breast cancer.   Allergies Allergies[1]   Home Medications  Prior to Admission medications  Medication Sig Start Date End Date Taking? Authorizing Provider  atorvastatin  (LIPITOR) 10 MG tablet Take 10 mg by mouth at bedtime.   Yes [provider]  baclofen  (LIORESAL) 10 MG tablet Take 10 mg by mouth See admin instructions. Take 10 mg by mouth in the morning and evening 08/27/17  Yes [provider]  Cholecalciferol (VITAMIN D3) 50 MCG (2000 UT) TABS Take 2,000 Units by mouth every morning.   Yes [provider]  Coenzyme Q10 (CO Q-10) 200 MG CAPS Take 200 mg by mouth at bedtime.   Yes [provider]  DULoxetine  (CYMBALTA ) 60 MG capsule Take 60 mg by mouth at bedtime. 08/14/17  Yes [provider]  ferrous sulfate  325 (65 FE) MG tablet Take 325 mg by mouth See admin instructions. Take 325 mg by mouth in the morning and at bedtime IN CONJUNCTION WITH ONE EMERGEN-C PACKET   Yes [provider]  GEMTESA 75 MG TABS Take 75 mg by mouth every evening.   Yes [provider]  JANUVIA 100 MG tablet Take 100 mg by mouth at bedtime.   Yes [provider]  levothyroxine  (SYNTHROID ) 137 MCG tablet Take 137 mcg by mouth daily before breakfast.   Yes [provider]  Multiple Vitamins-Minerals (EMERGEN-C VITAMIN C) PACK Take 1 packet by mouth See admin instructions. Mix and drink the contents of ONE PACKET IN CONJUNCTION WITH 325 mg ferrous sulfate - in the morning and at bedtime   Yes [provider]  OVER THE COUNTER MEDICATION Take 1 tablet by mouth See admin instructions. Multivitamin with NO IRON - Take 1 tablet by mouth with breakfast   Yes [provider]  oxyCODONE  (ROXICODONE ) 5 MG immediate release tablet Take 1 tablet (5 mg total) by mouth every 6 (six) hours as needed. Patient taking differently: Take 5 mg by mouth every  6 (six) hours as needed (for pain). 09/18/24  Yes [name], [name], DO  pantoprazole  (PROTONIX ) 40 MG tablet TAKE 1 TABLET BY MOUTH TWICE A DAY Patient taking differently: Take 40 mg by mouth in the morning and at bedtime. 04/10/22  Yes [name], DO  pioglitazone  (ACTOS ) 15 MG tablet Take 1 tablet (15 mg total) by mouth daily. Patient  taking differently: Take 15 mg by mouth in the morning. 09/18/24  Yes [name], [name], DO  primidone  (MYSOLINE ) 250 MG tablet Take 250 mg by mouth in the morning and at bedtime.   Yes [provider]  primidone  (MYSOLINE ) 50 MG tablet Take 50 mg by mouth daily with lunch. 12/15/22  Yes [provider]  REPATHA  SURECLICK 140 MG/ML SOAJ INJECT 140 MG UNDER THE SKIN EVERY 2 WEEKS (14 DAYS) AS DIRECTED 03/14/24  Yes Hilty, Vinie BROCKS, MD  senna-docusate (SENOKOT-S) 8.6-50 MG tablet Take 1 tablet by mouth daily. Patient taking differently: Take 1 tablet by mouth daily as needed for mild constipation. 09/18/24  Yes [name], [name], DO  tamsulosin  (FLOMAX ) 0.4 MG CAPS capsule Take 0.8 mg by mouth at bedtime.   Yes [provider]  valsartan (DIOVAN) 40 MG tablet Take 40 mg by mouth at bedtime.   Yes [provider]  atorvastatin  (LIPITOR) 40 MG tablet Take 1 tablet (40 mg total) by mouth daily. Patient not taking: Reported on 09/22/2024 01/30/24   [name], MD  azithromycin  (ZITHROMAX ) 250 MG tablet Take 1 tablet (250 mg total) by mouth daily. Take 1 every day until finished. Patient not taking: Reported on 09/22/2024 09/18/24   [name], [name], DO  Blood Glucose Monitoring Suppl (TRUE METRIX AIR GLUCOSE METER) DEVI True Metrix Air Glucose Meter kit    [provider]  glucose blood (TRUE METRIX BLOOD GLUCOSE TEST) test strip True Metrix Glucose Test Strip    [provider]  TRUEplus Lancets 33G MISC TRUEplus Lancets 33 gauge    [provider]     Harden GAILS. Jude MD [facility] 09/24/2024, 4:54 PM  Please see [email or web address] for pager details.  From 7A-7P if no response, please call [phone]. After hours, please call ELink [phone].          [1]  Allergies Allergen Reactions   Metformin And Related Diarrhea and Other (See Comments)    Explosive diarrhea   "

## 2024-09-25 ENCOUNTER — Inpatient Hospital Stay (HOSPITAL_COMMUNITY)

## 2024-09-25 DIAGNOSIS — J90 Pleural effusion, not elsewhere classified: Secondary | ICD-10-CM | POA: Diagnosis not present

## 2024-09-25 DIAGNOSIS — E1110 Type 2 diabetes mellitus with ketoacidosis without coma: Secondary | ICD-10-CM | POA: Diagnosis not present

## 2024-09-25 LAB — CBC
HCT: 31.6 % — ABNORMAL LOW (ref 39.0–52.0)
Hemoglobin: 10.9 g/dL — ABNORMAL LOW (ref 13.0–17.0)
MCH: 32.7 pg (ref 26.0–34.0)
MCHC: 34.5 g/dL (ref 30.0–36.0)
MCV: 94.9 fL (ref 80.0–100.0)
Platelets: 315 K/uL (ref 150–400)
RBC: 3.33 MIL/uL — ABNORMAL LOW (ref 4.22–5.81)
RDW: 12.2 % (ref 11.5–15.5)
WBC: 6.6 K/uL (ref 4.0–10.5)
nRBC: 0 % (ref 0.0–0.2)

## 2024-09-25 LAB — BASIC METABOLIC PANEL WITH GFR
Anion gap: 11 (ref 5–15)
BUN: 12 mg/dL (ref 8–23)
CO2: 23 mmol/L (ref 22–32)
Calcium: 9.1 mg/dL (ref 8.9–10.3)
Chloride: 100 mmol/L (ref 98–111)
Creatinine, Ser: 0.83 mg/dL (ref 0.61–1.24)
GFR, Estimated: >60 mL/min
Glucose, Bld: 250 mg/dL — ABNORMAL HIGH (ref 70–99)
Potassium: 3.8 mmol/L (ref 3.5–5.1)
Sodium: 135 mmol/L (ref 135–145)

## 2024-09-25 LAB — GLUCOSE, CAPILLARY
Glucose-Capillary: 250 mg/dL — ABNORMAL HIGH (ref 70–99)
Glucose-Capillary: 192 mg/dL — ABNORMAL HIGH (ref 70–99)
Glucose-Capillary: 241 mg/dL — ABNORMAL HIGH (ref 70–99)
Glucose-Capillary: 196 mg/dL — ABNORMAL HIGH (ref 70–99)

## 2024-09-25 LAB — BODY FLUID CELL COUNT WITH DIFFERENTIAL
Eos, Fluid: 0 %
Lymphs, Fluid: 7 %
Monocyte-Macrophage-Serous Fluid: 1 % — ABNORMAL LOW (ref 50–90)
Neutrophil Count, Fluid: 92 % — ABNORMAL HIGH (ref 0–25)
Total Nucleated Cell Count, Fluid: 156 uL (ref 0–1000)

## 2024-09-25 MED ORDER — SODIUM CHLORIDE 0.9 % IV SOLN
INTRAVENOUS | Status: AC
  Filled 2024-09-25: qty 500

## 2024-09-25 MED ORDER — FENTANYL CITRATE (PF) 100 MCG/2ML IJ SOLN
INTRAMUSCULAR | Status: AC | PRN
  Administered 2024-09-25 (×2): 50 ug via INTRAVENOUS

## 2024-09-25 MED ORDER — MIDAZOLAM HCL (PF) 2 MG/2ML IJ SOLN
INTRAMUSCULAR | Status: AC | PRN
  Administered 2024-09-25: 1 mg via INTRAVENOUS

## 2024-09-25 MED ORDER — FENTANYL CITRATE (PF) 100 MCG/2ML IJ SOLN
INTRAMUSCULAR | Status: AC
  Filled 2024-09-25: qty 2

## 2024-09-25 MED ORDER — INSULIN ASPART 100 UNIT/ML IJ SOLN
0.0000 [IU] | Freq: Three times a day (TID) | INTRAMUSCULAR | Status: DC
  Administered 2024-09-25: 3 [IU] via SUBCUTANEOUS
  Administered 2024-09-25: 2 [IU] via SUBCUTANEOUS
  Administered 2024-09-25 – 2024-09-26 (×2): 3 [IU] via SUBCUTANEOUS
  Administered 2024-09-26: 2 [IU] via SUBCUTANEOUS
  Administered 2024-09-26: 3 [IU] via SUBCUTANEOUS
  Administered 2024-09-27: 5 [IU] via SUBCUTANEOUS
  Administered 2024-09-27: 2 [IU] via SUBCUTANEOUS
  Administered 2024-09-27: 5 [IU] via SUBCUTANEOUS
  Administered 2024-09-28: 2 [IU] via SUBCUTANEOUS
  Administered 2024-09-28: 1 [IU] via SUBCUTANEOUS
  Administered 2024-09-28: 5 [IU] via SUBCUTANEOUS
  Administered 2024-09-29: 2 [IU] via SUBCUTANEOUS
  Administered 2024-09-29: 1 [IU] via SUBCUTANEOUS
  Administered 2024-09-30: 2 [IU] via SUBCUTANEOUS
  Administered 2024-09-30: 1 [IU] via SUBCUTANEOUS
  Administered 2024-09-30: 2 [IU] via SUBCUTANEOUS
  Administered 2024-10-01: 3 [IU] via SUBCUTANEOUS
  Administered 2024-10-01: 2 [IU] via SUBCUTANEOUS
  Administered 2024-10-01: 5 [IU] via SUBCUTANEOUS
  Administered 2024-10-02: 2 [IU] via SUBCUTANEOUS
  Administered 2024-10-02: 3 [IU] via SUBCUTANEOUS
  Administered 2024-10-02: 2 [IU] via SUBCUTANEOUS
  Administered 2024-10-03 (×2): 1 [IU] via SUBCUTANEOUS
  Administered 2024-10-03: 2 [IU] via SUBCUTANEOUS
  Filled 2024-09-25: qty 1
  Filled 2024-09-25: qty 2
  Filled 2024-09-25: qty 1
  Filled 2024-09-25 (×2): qty 3
  Filled 2024-09-25: qty 1
  Filled 2024-09-25: qty 2
  Filled 2024-09-25: qty 1
  Filled 2024-09-25: qty 2
  Filled 2024-09-25: qty 3
  Filled 2024-09-25: qty 2
  Filled 2024-09-25: qty 3
  Filled 2024-09-25: qty 2
  Filled 2024-09-25 (×2): qty 5
  Filled 2024-09-25 (×2): qty 2
  Filled 2024-09-25: qty 5
  Filled 2024-09-25 (×2): qty 2
  Filled 2024-09-25: qty 1
  Filled 2024-09-25: qty 2
  Filled 2024-09-25: qty 5
  Filled 2024-09-25: qty 3
  Filled 2024-09-25: qty 5
  Filled 2024-09-25: qty 2

## 2024-09-25 MED ORDER — MIDAZOLAM HCL 2 MG/2ML IJ SOLN
INTRAMUSCULAR | Status: AC
  Filled 2024-09-25: qty 2

## 2024-09-25 MED ORDER — SODIUM CHLORIDE (PF) 0.9 % IJ SOLN
10.0000 mg | Freq: Once | INTRAMUSCULAR | Status: AC
  Administered 2024-09-25: 10 mg via INTRAPLEURAL
  Filled 2024-09-25: qty 10

## 2024-09-25 MED ORDER — STERILE WATER FOR INJECTION IJ SOLN
5.0000 mg | Freq: Once | RESPIRATORY_TRACT | Status: AC
  Administered 2024-09-25: 5 mg via INTRAPLEURAL
  Filled 2024-09-25: qty 5

## 2024-09-25 MED ORDER — INSULIN ASPART 100 UNIT/ML IJ SOLN
0.0000 [IU] | Freq: Every day | INTRAMUSCULAR | Status: DC
  Administered 2024-09-27: 2 [IU] via SUBCUTANEOUS
  Administered 2024-09-30: 3 [IU] via SUBCUTANEOUS
  Administered 2024-10-01 – 2024-10-02 (×2): 2 [IU] via SUBCUTANEOUS
  Filled 2024-09-25: qty 2
  Filled 2024-09-25: qty 3
  Filled 2024-09-25: qty 2
  Filled 2024-09-25: qty 3

## 2024-09-25 MED ORDER — INSULIN ASPART 100 UNIT/ML IJ SOLN
5.0000 [IU] | Freq: Three times a day (TID) | INTRAMUSCULAR | Status: DC
  Administered 2024-09-25 – 2024-09-26 (×2): 5 [IU] via SUBCUTANEOUS
  Filled 2024-09-25 (×3): qty 5

## 2024-09-25 MED ORDER — INSULIN GLARGINE 100 UNIT/ML ~~LOC~~ SOLN
20.0000 [IU] | Freq: Every day | SUBCUTANEOUS | Status: DC
  Administered 2024-09-25 – 2024-09-26 (×2): 20 [IU] via SUBCUTANEOUS
  Filled 2024-09-25 (×2): qty 0.2

## 2024-09-25 MED ORDER — HYDRALAZINE HCL 20 MG/ML IJ SOLN
5.0000 mg | Freq: Four times a day (QID) | INTRAMUSCULAR | Status: DC | PRN
  Administered 2024-09-25 – 2024-09-26 (×2): 5 mg via INTRAVENOUS
  Filled 2024-09-25 (×2): qty 1

## 2024-09-25 NOTE — Sedation Documentation (Signed)
 RN Phyllis Whitefield pulled 2 mg Versed  and 100 mcg Fentanyl  in CT pysix. Pt. Received 2 mg Versed  and 100 mcg Fentanyl  throughout the procedure.

## 2024-09-25 NOTE — Procedures (Addendum)
 Vascular and Interventional Radiology Procedure Note  Patient: Kevin Olson DOB: 10/10/1945 Medical Record Number: [id number] Note Date/Time: 09/25/2024 1:38 PM   Performing Physician: Thom Hall, MD Assistant(s): None  Diagnosis:  Loculated LEFT pleural effusion. L chest empyema  Procedure:  LEFT PERCUTANEOUS THORACOSTOMY DRAINAGE CATHETER PLACEMENT for fibrinolysis  Anesthesia: Conscious Sedation Complications: None Estimated Blood Loss: Minimal Specimens: Sent for Gram Stain, Aerobe Culture, and Anerobe Culture  Findings:  Successful CT-guided placement of 14 F catheter into LEFT chest.  Plan:  - Fibrinolysis per Pulmonary team. - Chest tube to -20 cm H20 suction. Additional management per Primary.  See detailed procedure note with images in PACS. The patient tolerated the procedure well without incident or complication and was returned to Recovery in stable condition.    Thom Hall, MD [facility]   Pager. [phone] Clinic. [phone]

## 2024-09-25 NOTE — Progress Notes (Signed)
 " PROGRESS NOTE Kevin Olson  FMW:[id number] DOB: [birth year] DOA: 09/22/2024 PCP: Nanci Senior, MD  Brief Narrative/Hospital Course: Kevin Olson is a 79 y.o. male with PMH of diabetes, history of prostate cancer, who has been sick for about 2 week, presented to the ED and found to have with DKA and large left pleural effusion and admitted on 1/5 and s/p left thoracentesis 50 cc yellow fluid, complex on ultrasound. Recent ED visit 1/1-diagnosed with pneumonia and discharged, also noted to be hyperglycemic with high anion gap at that time.  CT abdomen pelvis with contrast on 1/1 no acute finding. DKA resolved. Having intermittent fever 1/6-CT chest done 1/7- Empyema w/  LUL/LL consolidation-possible pneumonia.  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  Initially treated left-sided pneumonia-likely the trigger for DKA.  A1c 13.2 indicating poor control at baseline. DKA resolved, patient now on Lantus -still with uncontrolled hyperglycemia increase Lantus  to 20 u, add Premeal insulin  5 I tid cont ssi 0-9  DM coordinator following - he endorses he can manage insulin  at home.He will need home insulin  will discharge, along with insulin  teaching. Recent Labs  Lab 09/22/24 1621 09/22/24 1733 09/24/24 0747 09/24/24 1147 09/24/24 1634 09/24/24 2114 09/25/24 0740  GLUCAP  --    < > 211* 281* 270* 279* 250*  HGBA1C 13.2*  --   --   --   --   --   --    < > = values in this interval not displayed.   Left loculated complex pleural effusion- suspected empyema Left-sided community-acquired pneumonia-recently treated: No leukocytosis, but having  intermittent fever. S/p  50 cc thoracentesis done 1/5-Gram stain no organism, culture pending, blood culture NGTD. fluid LDH 639 - appears exudative as ldh > 200, checked serum LDH - 134 serum protein 6.6 pleural fluid protein 4.7  CT chest done 1/7-pockets of air noted within no fluid on CT could be due to elevation thoracentesis versus empyema-PCCM consulted planning for pigtail drain is placement by IR and possible intrapleural lytics.  Patient started on Zosyn  1/7>>  Hypertension Hyperlipidemia: Cardiomegaly per CT Aortic atherosclerosis per CT: PTA valsartan bedtime, BP Boderline controlled.  Back on valsartan 1/7, back on Lipitor.He is on Repatha  as outpatient.  Chronic anemia on iron supplement: Resumed iron supplement  Hypothyroidism: Continue home Synthroid   On chronic oxycodone /Cymbalta  primidone : Continue same  Acute urinary retention/ OAB W/ incontinence at baseline: Foley catheter placed in the ED- removed 1/7-will be reinstated given need for multiple in and out overnight Cont Flomax ,Gemtesa    Class I Obesity w/ Body mass index is 34.16 kg/m.: Will benefit with PCP follow-up, weight loss,healthy lifestyle and outpatient sleep eval if not done.  Mobility: PT Orders: Active PT Follow up Rec:     DVT prophylaxis: enoxaparin  (LOVENOX ) injection 40 mg Start: 09/23/24 1200 SCDs Start: 09/22/24 1444 Code Status:   Code Status: Limited: Do not attempt resuscitation (DNR) -DNR-LIMITED -Do Not Intubate/DNI  Family Communication: plan of care discussed with patient at bedside. Patient status is: Remains hospitalized because of severity of illness Level of care: Stepdown   Dispo: The patient is from: home w/ wife            Anticipated disposition: TBD-PT OT  ordered Objective: Vitals last 24 hrs: Vitals:   09/25/24 0346 09/25/24 0745 09/25/24 0800 09/25/24 0900  BP:   (!) 168/55 (!) 145/53  Pulse:   64 61  Resp:   17 16  Temp: 98 F (36.7 C) 98.7 F (37.1 C)     TempSrc: Axillary Oral    SpO2:   91% 91%  Weight:      Height:        Physical Examination: General exam:AAOX3 HEENT:Oral mucosa moist, Ear/Nose WNL grossly Respiratory system: Bilaterally clear BS-diminished breath sounds on the left Cardiovascular system: S1 & S2 +, No JVD. Gastrointestinal system: Abdomen soft,NT,ND, BS+ Nervous System: Alert, awake, moving all extremities,and following commands. Extremities: extremities warm, leg edema neg Skin: Warm, no rashes MSK: Normal muscle bulk,tone, power  FOLEY+  Medications reviewed:  Scheduled Meds:  atorvastatin   10 mg Oral QHS   Chlorhexidine  Gluconate Cloth  6 each Topical Daily   DULoxetine   60 mg Oral QHS   enoxaparin  (LOVENOX ) injection  40 mg Subcutaneous Q24H   insulin  aspart  0-5 Units Subcutaneous QHS   insulin  aspart  0-9 Units Subcutaneous TID WC   insulin  aspart  5 Units Subcutaneous TID WC   insulin  glargine  20 Units Subcutaneous Daily   irbesartan   37.5 mg Oral Daily   levothyroxine   137 mcg Oral QAC breakfast   mirabegron  ER  25 mg Oral q1800   pantoprazole   40 mg Oral BID   primidone   250 mg Oral BID   primidone   50 mg Oral Q1200   sodium chloride  flush  3 mL Intravenous Q12H   tamsulosin   0.8 mg Oral QHS   Continuous Infusions:  piperacillin -tazobactam (ZOSYN )  IV 12.5 mL/hr at 09/25/24 0900   Diet: Diet Order             Diet NPO time specified Except for: Sips with Meds  Diet effective now                   Data Reviewed: I have personally reviewed following labs and imaging studies ( see epic result tab) CBC: Recent Labs  Lab 09/22/24 1107 09/24/24 0826 09/25/24 0322  WBC 9.7 7.6 6.6  NEUTROABS 8.3*  --   --   HGB 12.8* 11.3* 10.9*  HCT 38.2* 32.4* 31.6*  MCV 98.2 95.3 94.9  PLT 356 328 315   CMP: Recent Labs  Lab 09/23/24 0326 09/23/24 0731 09/23/24 1241 09/23/24 1543 09/25/24 0322  NA 135 136 136 135 135  K 3.8 3.6 4.0 3.8 3.8  CL 103 106 104 104 100  CO2 20* 20* 22  21* 23  GLUCOSE 176* 155* 186* 237* 250*  BUN 13 12 10 9 12   CREATININE 0.83 0.76 0.71 0.75 0.83  CALCIUM  9.7 9.3 9.6 9.4 9.1   GFR: Estimated Creatinine Clearance: 93.2 mL/min (by C-G formula based on SCr of 0.83 mg/dL). Recent Labs  Lab 09/22/24 1107 09/24/24 0826  AST 21 93*  ALT 9 43  ALKPHOS 93 165*  BILITOT 0.2 0.3  PROT 7.7 6.6  ALBUMIN 3.3* 2.7*    No results for input(s): LIPASE, AMYLASE in the last 168 hours.  No results for input(s): AMMONIA in the last 168 hours. Coagulation Profile: No results for input(s): INR, PROTIME in the last 168 hours. Unresulted Labs (From admission, onward)     Start     Ordered   09/25/24 0824  Body fluid cell count with differential  (Thoracentesis Labs Panel)  Once,  R       Question:  Are there also cytology or pathology orders on this specimen?  Answer:  Yes   09/25/24 0824   09/25/24 0824  Body fluid culture w Gram Stain  (Thoracentesis Labs Panel)  Once,   R       Question:  Are there also cytology or pathology orders on this specimen?  Answer:  Yes   09/25/24 0824   09/25/24 0500  Basic metabolic panel with GFR  Daily,   R     Question:  Specimen collection method  Answer:  Lab=Lab collect   09/24/24 1048   09/25/24 0500  CBC  Daily,   R     Question:  Specimen collection method  Answer:  Lab=Lab collect   09/24/24 1048   09/23/24 0902  Expectorated Sputum Assessment w Gram Stain, Rflx to Resp Cult  Once,   R        09/23/24 0901           Antimicrobials/Microbiology: Anti-infectives (From admission, onward)    Start     Dose/Rate Route Frequency Ordered Stop   09/24/24 1800  Ampicillin-Sulbactam (UNASYN) 3 g in sodium chloride  0.9 % 100 mL IVPB  Status:  Discontinued        3 g 200 mL/hr over 30 Minutes Intravenous Every 6 hours 09/24/24 1645 09/24/24 1703   09/24/24 1800  piperacillin -tazobactam (ZOSYN ) IVPB 3.375 g        3.375 g 12.5 mL/hr over 240 Minutes Intravenous Every 8 hours 09/24/24 1705      09/24/24 1000  azithromycin  (ZITHROMAX ) tablet 500 mg  Status:  Discontinued        500 mg Oral Daily 09/23/24 1249 09/24/24 1645   09/23/24 1000  cefTRIAXone  (ROCEPHIN ) 2 g in sodium chloride  0.9 % 100 mL IVPB  Status:  Discontinued        2 g 200 mL/hr over 30 Minutes Intravenous Every 24 hours 09/23/24 0901 09/24/24 1645   09/23/24 1000  azithromycin  (ZITHROMAX ) 500 mg in sodium chloride  0.9 % 250 mL IVPB  Status:  Discontinued        500 mg 250 mL/hr over 60 Minutes Intravenous Every 24 hours 09/23/24 0901 09/23/24 1249         Component Value Date/Time   SDES  09/23/2024 1246    BLOOD RIGHT HAND Performed at [facility], [address], [place], [place] [address]    SPECREQUEST  09/23/2024 1246    BOTTLES DRAWN AEROBIC AND ANAEROBIC Blood Culture adequate volume Performed at [facility], [address], [place], [place] [address]    CULT  09/23/2024 1246    NO GROWTH 2 DAYS Performed at [facility], [address], [place], [place] [address]    REPTSTATUS PENDING 09/23/2024 1246    Procedures:  Mennie LAMY, MD [facility] 09/25/2024, 9:27 AM   "

## 2024-09-25 NOTE — TOC Initial Note (Signed)
 Transition of Care [facility]) - Initial/Assessment Note    Patient Details  Name: Kevin Olson MRN: [id number] Date of Birth: Dec 24, 1945  Transition [facility]) CM/SW Contact:    Jon ONEIDA Anon, RN Phone Number: 09/25/2024, 4:39 PM  Clinical Narrative:                 Pt is from home. Pt with new chest tube placement. PT/OT consulted, awaiting recommendations. Pt needing continued medical workup, not medically ready for discharge. ICM will continue to follow.   Expected Discharge Plan:  (TBD awaiting PT/OT recommendations) Barriers to Discharge: Continued Medical Work up   Patient Goals and CMS Choice Patient states their goals for this hospitalization and ongoing recovery are:: Home CMS Medicare.gov Compare Post Acute Care list provided to:: Patient Choice offered to / list presented to : Patient [place] ownership interest in [facility]provided to:: Patient    Expected Discharge Plan and Services In-house Referral: NA Discharge Planning Services: CM Consult Post Acute Care Choice: Durable Medical Equipment Living arrangements for the past 2 months: Single Family Home                                      Prior Living Arrangements/Services Living arrangements for the past 2 months: Single Family Home Lives with:: Spouse Patient language and need for interpreter reviewed:: Yes Do you feel safe going back to the place where you live?: Yes      Need for Family Participation in Patient Care: Yes (Comment) Care giver support system in place?: Yes (comment) Current home services: DME Criminal Activity/Legal Involvement Pertinent to Current Situation/Hospitalization: No - Comment as needed  Activities of Daily Living   ADL Screening (condition at time of admission) Independently performs ADLs?: Yes (appropriate for developmental age) Is the patient deaf or have difficulty hearing?: No Does the patient have difficulty seeing, even when wearing  glasses/contacts?: No Does the patient have difficulty concentrating, remembering, or making decisions?: No  Permission Sought/Granted Permission sought to share information with : Family Supports    Share Information with NAME: Lockyer,Cherie  Spouse, Emergency Contact  [phone]           Emotional Assessment         Alcohol / Substance Use: Not Applicable Psych Involvement: No (comment)  Admission diagnosis:  Urinary retention [R33.9] Confusion [R41.0] DKA (diabetic ketoacidosis) (HCC) [E11.10] Hyperglycemia [R73.9] Patient Active Problem List   Diagnosis Date Noted   Pleural effusion 09/25/2024   Empyema (HCC) 09/24/2024   Acute urinary retention 09/23/2024   Pleural effusion on left 09/23/2024   Acute metabolic encephalopathy 09/23/2024   DKA (diabetic ketoacidosis) (HCC) 09/22/2024   Genetic testing 05/16/2022   Family history of pancreatic cancer 05/03/2022   Family history of brain cancer 05/03/2022   IDA (iron deficiency anemia) 03/18/2022   Anxiety 03/18/2022   Diabetic peripheral neuropathy (HCC) 03/18/2022   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   Essential tremor 03/18/2022   Hypothyroidism 03/18/2022   Mixed hyperlipidemia 03/18/2022   History of colonic polyps 03/18/2022   Polyneuropathy 03/18/2022   Osteoarthritis 03/18/2022   Diabetes mellitus type 2, noninsulin dependent (HCC) 03/18/2022   Morbid obesity (HCC) 03/18/2022   Symptomatic anemia 03/17/2022   S/P total knee arthroplasty, right 12/21/2020   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/26/2020   Malignant neoplasm of prostate (HCC) 07/22/2019   Osteoarthritis of left knee 10/02/2018  Osteoarthritis of right knee 10/02/2018   Bilateral knee pain 09/03/2018   Low back pain 12/17/2017   Spinal stenosis of lumbar region 12/17/2017   PCP:  Nanci Senior, MD Pharmacy:   [facility] [phone] - [place], [place] - [address] [place] [address] [place] [address] [place]  [address] Phone: [phone] Fax: [phone]     Social Drivers of Health (SDOH) Social History: SDOH Screenings   Food Insecurity: No Food Insecurity (09/22/2024)  Housing: Low Risk (09/22/2024)  Transportation Needs: No Transportation Needs (09/22/2024)  Utilities: Not At Risk (09/22/2024)  Depression (PHQ2-9): Low Risk (06/02/2024)  Social Connections: Patient Declined (09/22/2024)  Tobacco Use: Medium Risk (09/22/2024)   SDOH Interventions:     Readmission Risk Interventions    09/25/2024    4:37 PM  Readmission Risk Prevention Plan  Transportation Screening Complete  PCP or Specialist Appt within 5-7 Days Complete  Home Care Screening Complete  Medication Review (RN CM) Complete

## 2024-09-25 NOTE — Plan of Care (Signed)
" °  Problem: Nutritional: Goal: Maintenance of adequate nutrition will improve Outcome: Progressing   Problem: Health Behavior/Discharge Planning: Goal: Ability to identify and utilize available resources and services will improve Outcome: Progressing Goal: Ability to manage health-related needs will improve Outcome: Progressing   Problem: Nutritional: Goal: Maintenance of adequate nutrition will improve Outcome: Progressing   Problem: Respiratory: Goal: Will regain and/or maintain adequate ventilation Outcome: Progressing   Problem: Urinary Elimination: Goal: Ability to achieve and maintain adequate renal perfusion and functioning will improve Outcome: Progressing   "

## 2024-09-25 NOTE — Progress Notes (Signed)
" ° °  NAME:  Kevin Olson, MRN:  [id number], DOB:  1945-12-13, LOS: 3 ADMISSION DATE:  09/22/2024, CONSULTATION DATE:  1/7 REFERRING MD:  1/7, CHIEF COMPLAINT:  L empyema   History of Present Illness:  Patient is a 80 yo M  presents to [facility] ED on 1/5 w/ Left pleural effusion and DKA. He is on Januvia and Actos , HbA1c 13.2.  He had urinary retention and Foley catheter was placed. He underwent thoracentesis on 1/5, only 50 cc of yellow fluid was removed . Pleural fluid showed total protein 4.7, LDH 639, 260 cells 92% neutrophils .  Cultures negative He had low-grade fever 100.5 today, CT chest was obtained which showed moderate loculated left pleural effusion with gas collections within the pleural fluid and consolidation of left upper and lower lobes. Hence PCCM consulted.  Patient reports dry cough and left-sided chest pain     Pertinent  Medical History   DMT2, prostate cancer, HTN, HLD    Past Medical History:  Diagnosis Date   Depression    DM2 (diabetes mellitus, type 2) (HCC)    Dyslipidemia    Family history of brain cancer    Family history of pancreatic cancer    Hypertension    OA (osteoarthritis) of knee    Prostate cancer (HCC)    prostate     Significant Hospital Events: Including procedures, antibiotic start and stop dates in addition to other pertinent events   1/5 admit w/ L pleural effusion; only 50 mL of fluid removed 1/7 pccm consulted for empyema; no good pockets appreciated on US  1/8 IR to place CT guided Chest tube  Interim History / Subjective:  Room air  Objective    Blood pressure (!) 150/59, pulse 64, temperature 98 F (36.7 C), temperature source Axillary, resp. rate (!) 22, height 5' 10 (1.778 m), weight 108 kg, SpO2 92%.        Intake/Output Summary (Last 24 hours) at 09/25/2024 0819 Last data filed at 09/25/2024 0801 Gross per 24 hour  Intake 662.61 ml  Output 2874 ml  Net -2211.39 ml   Filed Weights   09/22/24 1031  Weight: 108 kg     Examination: General:  NAD HEENT: MM pink/moist Neuro: Aox3; MAE CV: s1s2, RRR, no m/r/g PULM:  BS more dim on left; room air GI: soft, bsx4 active  Extremities: warm/dry, no edema  Skin: no rashes or lesions   Resolved problem list   Assessment and Plan   Left loculated complex pleural effusion -pockets of air noted within no fluid on CT could be result of recent thoracentesis versus empyema.  He had low-grade fever, no leukocytosis Plan: -IR to place CT guided Chest tube -monitor CT outpt post placements -will likely required tube lytics -cont zosyn  -will send pleural culture -trend CXR    JD Emilio RIGGERS [facility] 09/25/2024, 8:27 AM  Please see [email or web address] for pager details.  From 7A-7P if no response, please call [phone]. After hours, please call ELink [phone].         "

## 2024-09-25 NOTE — Progress Notes (Signed)
 OT Cancellation Note  Patient Details Name: Kevin Olson MRN: [id number] DOB: [birth year]   Cancelled Treatment:    Reason Eval/Treat Not Completed: Patient at procedure or test/ unavailable.  Delanna JINNY Lesches, OTR/L 09/25/2024, 2:40 PM

## 2024-09-25 NOTE — Progress Notes (Signed)
 PT Cancellation Note  Patient Details Name: Kevin Olson MRN: [id number] DOB: 10/04/1945   Cancelled Treatment:    Reason Eval/Treat Not Completed: Other (comment) Pt s/p chest tube placement today.  Will check back as schedule permits.   Tari CROME Payson 09/25/2024, 2:43 PM Tari KLEIN, DPT Physical Therapist Acute Rehabilitation Services Office: [phone]

## 2024-09-25 NOTE — Procedures (Signed)
 Pleural Fibrinolytic Administration Procedure Note  Kevin Olson  [id number]  [birth year]  Date:09/25/2024  Time:4:23 PM   Provider Performing:Jahquez Steffler D Emilio   Procedure: Pleural Fibrinolysis Initial day [phone])  Indication(s) Fibrinolysis of complicated pleural effusion  Consent Risks of the procedure as well as the alternatives and risks of each were explained to the patient and/or caregiver.  Consent for the procedure was obtained.   Anesthesia None   Time Out Verified patient identification, verified procedure, site/side was marked, verified correct patient position, special equipment/implants available, medications/allergies/relevant history reviewed, required imaging and test results available.   Sterile Technique Hand hygiene, gloves   Procedure Description Existing pleural catheter was cleaned and accessed in sterile manner.  10mg  of tPA in 30cc of saline and 5mg  of dornase in 30cc of sterile water  were injected into pleural space using existing pleural catheter.  Catheter will be clamped for 1 hour and then placed back to suction.   Complications/Tolerance None; patient tolerated the procedure well.   EBL None   Specimen(s) None  JD Emilio DEVONNA [facility] 09/25/2024, 4:23 PM  Please see [email or web address] for pager details.  From 7A-7P if no response, please call [phone]. After hours, please call ELink [phone].

## 2024-09-25 NOTE — Consult Note (Signed)
 "   Chief Complaint: Empyema  Referring Provider(s): Dr. Jude (Pulm)  Supervising Physician: Hughes Simmonds  Patient Status: [facility] - In-pt  History of Present Illness: Kevin Olson is a 79 y.o. male with PMH significant for DM, HLD, HTN, prostate CA. He was admitted for L pleural effusion and DKA 09/22/24. Thoracentesis that day yielded 50 cc yellow fluid. Patient continues to be febrile and most recent CT 1/7 showed the following: Moderate loculated appearing left pleural effusion with multiple loculated gas collections within the pleural fluid, appearance is suspect for empyema, further assessment limited in the absence of contrast. Consolidative airspace disease in the left upper and lower lobes, possible pneumonia.  IR consulted for chest tube placement, planned lytics.   Confirms NPO since MN.  Does not wear CPAP or use supplemental home O2.  Allergies Reviewed:  Metformin and related    Patient confirms he would like DNR maintained in procedure.   Past Medical History:  Diagnosis Date   Depression    DM2 (diabetes mellitus, type 2) (HCC)    Dyslipidemia    Family history of brain cancer    Family history of pancreatic cancer    Hypertension    OA (osteoarthritis) of knee    Prostate cancer [facility])    prostate    Past Surgical History:  Procedure Laterality Date   BIOPSY  03/19/2022   Procedure: BIOPSY;  Surgeon: Rosalie Kitchens, MD;  Location: [facility];  Service: Gastroenterology;;   ORIN MEDIATE RELEASE Right 12/05/2023   Procedure: CARPAL TUNNEL RELEASE;  Surgeon: Romona Harari, MD;  Location: [facility];  Service: Orthopedics;  Laterality: Right;   ESOPHAGOGASTRODUODENOSCOPY (EGD) WITH PROPOFOL  N/A 03/19/2022   Procedure: ESOPHAGOGASTRODUODENOSCOPY (EGD) WITH PROPOFOL ;  Surgeon: Rosalie Kitchens, MD;  Location: [facility];  Service: Gastroenterology;  Laterality: N/A;   INGUINAL HERNIA REPAIR     IR THORACENTESIS RIGHT ASP PLEURAL SPACE W/IMG GUIDE   09/22/2024   PROSTATE BIOPSY     TOTAL KNEE ARTHROPLASTY Left 10/26/2020   Procedure: TOTAL KNEE ARTHROPLASTY;  Surgeon: Ernie Cough, MD;  Location: [facility];  Service: Orthopedics;  Laterality: Left;  70 mins   TOTAL KNEE ARTHROPLASTY Right 12/21/2020   Procedure: TOTAL KNEE ARTHROPLASTY;  Surgeon: Ernie Cough, MD;  Location: [facility];  Service: Orthopedics;  Laterality: Right;  70 mins      Medications: Prior to Admission medications  Medication Sig Start Date End Date Taking? Authorizing Provider  atorvastatin  (LIPITOR) 10 MG tablet Take 10 mg by mouth at bedtime.   Yes [provider]  baclofen (LIORESAL) 10 MG tablet Take 10 mg by mouth See admin instructions. Take 10 mg by mouth in the morning and evening 08/27/17  Yes [provider]  Cholecalciferol (VITAMIN D3) 50 MCG (2000 UT) TABS Take 2,000 Units by mouth every morning.   Yes [provider]  Coenzyme Q10 (CO Q-10) 200 MG CAPS Take 200 mg by mouth at bedtime.   Yes [provider]  DULoxetine  (CYMBALTA ) 60 MG capsule Take 60 mg by mouth at bedtime. 08/14/17  Yes [provider]  ferrous sulfate  325 (65 FE) MG tablet Take 325 mg by mouth See admin instructions. Take 325 mg by mouth in the morning and at bedtime IN CONJUNCTION WITH ONE EMERGEN-C PACKET   Yes [provider]  GEMTESA 75 MG TABS Take 75 mg by mouth every evening.   Yes [provider]  JANUVIA 100 MG tablet Take 100 mg by mouth at bedtime.  Yes [provider]  levothyroxine  (SYNTHROID ) 137 MCG tablet Take 137 mcg by mouth daily before breakfast.   Yes [provider]  Multiple Vitamins-Minerals (EMERGEN-C VITAMIN C) PACK Take 1 packet by mouth See admin instructions. Mix and drink the contents of ONE PACKET IN CONJUNCTION WITH 325 mg ferrous sulfate - in the morning and at bedtime   Yes [provider]  OVER THE COUNTER MEDICATION Take 1 tablet by mouth See admin instructions.  Multivitamin with NO IRON - Take 1 tablet by mouth with breakfast   Yes [provider]  oxyCODONE  (ROXICODONE ) 5 MG immediate release tablet Take 1 tablet (5 mg total) by mouth every 6 (six) hours as needed. Patient taking differently: Take 5 mg by mouth every 6 (six) hours as needed (for pain). 09/18/24  Yes [name], [name], DO  pantoprazole  (PROTONIX ) 40 MG tablet TAKE 1 TABLET BY MOUTH TWICE A DAY Patient taking differently: Take 40 mg by mouth in the morning and at bedtime. 04/10/22  Yes [name], DO  pioglitazone  (ACTOS ) 15 MG tablet Take 1 tablet (15 mg total) by mouth daily. Patient taking differently: Take 15 mg by mouth in the morning. 09/18/24  Yes [name], [name], DO  primidone  (MYSOLINE ) 250 MG tablet Take 250 mg by mouth in the morning and at bedtime.   Yes [provider]  primidone  (MYSOLINE ) 50 MG tablet Take 50 mg by mouth daily with lunch. 12/15/22  Yes [provider]  REPATHA  SURECLICK 140 MG/ML SOAJ INJECT 140 MG UNDER THE SKIN EVERY 2 WEEKS (14 DAYS) AS DIRECTED 03/14/24  Yes Hilty, Vinie BROCKS, MD  senna-docusate (SENOKOT-S) 8.6-50 MG tablet Take 1 tablet by mouth daily. Patient taking differently: Take 1 tablet by mouth daily as needed for mild constipation. 09/18/24  Yes [name], [name], DO  tamsulosin  (FLOMAX ) 0.4 MG CAPS capsule Take 0.8 mg by mouth at bedtime.   Yes [provider]  valsartan (DIOVAN) 40 MG tablet Take 40 mg by mouth at bedtime.   Yes [provider]  atorvastatin  (LIPITOR) 40 MG tablet Take 1 tablet (40 mg total) by mouth daily. Patient not taking: Reported on 09/22/2024 01/30/24   [name], MD  azithromycin  (ZITHROMAX ) 250 MG tablet Take 1 tablet (250 mg total) by mouth daily. Take 1 every day until finished. Patient not taking: Reported on 09/22/2024 09/18/24   [name], [name], DO  Blood Glucose Monitoring Suppl (TRUE METRIX AIR GLUCOSE METER) DEVI True Metrix Air Glucose Meter kit     [provider]  glucose blood (TRUE METRIX BLOOD GLUCOSE TEST) test strip True Metrix Glucose Test Strip    [provider]  TRUEplus Lancets 33G MISC TRUEplus Lancets 33 gauge    [provider]     Family History  Problem Relation Age of Onset   CAD Mother    Pancreatic cancer Sister 8   Other Sister        brain tumor   Leukemia Sister    Celiac disease Sister    CAD Brother        CABGx4    CAD Brother        CABGx4   Lung cancer Maternal Grandfather    Lung cancer Paternal Grandfather    Cancer Maternal Aunt        unknown   Celiac disease Maternal Aunt    Cancer Maternal Uncle        unknown   Cancer Cousin  unknown   Spina bifida Niece    Colon cancer Neg Hx    Breast cancer Neg Hx     Social History   Socioeconomic History   Marital status: Married    Spouse name: Not on file   Number of children: Not on file   Years of education: Not on file   Highest education level: Not on file  Occupational History   Occupation: retired    Comment: holiday representative  Tobacco Use   Smoking status: Former    Current packs/day: 0.00    Average packs/day: 2.0 packs/day for 12.0 years (24.0 ttl pk-yrs)    Types: Cigarettes    Start date: 04/19/1967    Quit date: 04/19/1979    Years since quitting: 45.4   Smokeless tobacco: Never  Vaping Use   Vaping status: Never Used  Substance and Sexual Activity   Alcohol use: Yes    Comment: occas.   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  Other Topics Concern   Not on file  Social History Narrative   Not on file   Social Drivers of Health   Tobacco Use: Medium Risk (09/22/2024)   Patient History    Smoking Tobacco Use: Former    Smokeless Tobacco Use: Never    Passive Exposure: Not on Actuary Strain: Not on file  Food Insecurity: No Food Insecurity (09/22/2024)   Epic    Worried About Programme Researcher, Broadcasting/film/video in the  Last Year: Never true    Ran Out of Food in the Last Year: Never true  Transportation Needs: No Transportation Needs (09/22/2024)   Epic    Lack of Transportation (Medical): No    Lack of Transportation (Non-Medical): No  Physical Activity: Not on file  Stress: Not on file  Social Connections: Patient Declined (09/22/2024)   Social Connection and Isolation Panel    Frequency of Communication with Friends and Family: Patient declined    Frequency of Social Gatherings with Friends and Family: Patient declined    Attends Religious Services: Patient declined    Database Administrator or Organizations: Patient declined    Attends Banker Meetings: Patient declined    Marital Status: Patient declined  Depression (PHQ2-9): Low Risk (06/02/2024)   Depression (PHQ2-9)    PHQ-2 Score: 0  Alcohol Screen: Not on file  Housing: Low Risk (09/22/2024)   Epic    Unable to Pay for Housing in the Last Year: No    Number of Times Moved in the Last Year: 0    Homeless in the Last Year: No  Utilities: Not At Risk (09/22/2024)   Epic    Threatened with loss of utilities: No  Health Literacy: Not on file     Review of Systems: A 12 point ROS discussed and pertinent positives are indicated in the HPI above.  All other systems are negative.  Review of Systems  Constitutional:  Positive for fatigue and fever. Negative for chills.  HENT:  Negative for sore throat.   Respiratory:  Positive for shortness of breath.   Cardiovascular:  Negative for chest pain.  Gastrointestinal:  Negative for abdominal pain, blood in stool, nausea and vomiting.    Vital Signs: BP (!) 150/59   Pulse 64   Temp 98.7 F (37.1 C) (Oral)   Resp (!) 22   Ht 5' 10 (1.778 m)   Wt 238 lb 1.6  oz (108 kg)   SpO2 92%   BMI 34.16 kg/m      Physical Exam Constitutional:      Appearance: He is obese.     Comments: Appears flushed  HENT:     Mouth/Throat:     Mouth: Mucous membranes are moist.     Pharynx:  Oropharynx is clear.  Cardiovascular:     Rate and Rhythm: Normal rate and regular rhythm.     Pulses: Normal pulses.     Heart sounds: Normal heart sounds.  Pulmonary:     Effort: Pulmonary effort is normal. No respiratory distress.     Breath sounds: No wheezing.     Comments: L diminished base Abdominal:     General: There is distension.     Palpations: Abdomen is soft.     Tenderness: There is no abdominal tenderness.     Comments: Mild distention  Skin:    General: Skin is warm and dry.  Neurological:     Mental Status: He is alert and oriented to person, place, and time.  Psychiatric:        Mood and Affect: Mood normal.        Behavior: Behavior normal.        Thought Content: Thought content normal.        Judgment: Judgment normal.     Imaging: CT CHEST WO CONTRAST Result Date: 09/24/2024 CLINICAL DATA:  Fever DKA EXAM: CT CHEST WITHOUT CONTRAST TECHNIQUE: Multidetector CT imaging of the chest was performed following the standard protocol without IV contrast. RADIATION DOSE REDUCTION: This exam was performed according to the departmental dose-optimization program which includes automated exposure control, adjustment of the mA and/or kV according to patient size and/or use of iterative reconstruction technique. COMPARISON:  Chest x-ray 09/22/2024 FINDINGS: Cardiovascular: Limited assessment without intravenous contrast. Moderate aortic atherosclerosis. No aneurysm. Coronary vascular calcification. Cardiomegaly. No significant pericardial effusion Mediastinum/Nodes: Patent trachea. No suspicious thyroid  mass. No suspicious lymph nodes. Esophagus within normal limits. Lungs/Pleura: Moderate loculated appearing left pleural effusion. Multiple loculated gas collections within the pleural fluid. Consolidative airspace disease in the left upper and lower lobes. Upper Abdomen: No acute finding Musculoskeletal: No acute osseous abnormality IMPRESSION: 1. Moderate loculated appearing left  pleural effusion with multiple loculated gas collections within the pleural fluid, appearance is suspect for empyema, further assessment limited in the absence of contrast. 2. Consolidative airspace disease in the left upper and lower lobes, possible pneumonia. 3. Cardiomegaly. 4. Aortic atherosclerosis. Aortic Atherosclerosis (ICD10-I70.0). Electronically Signed   By: Luke Bun M.D.   On: 09/24/2024 16:20   IR THORACENTESIS ASP PLEURAL SPACE W/IMG GUIDE Result Date: 09/23/2024 INDICATION: Patient with recent diagnosis of pneumonia, currently admitted with DKA and large left pleural effusion. Request for diagnostic and therapeutic left thoracentesis. EXAM: ULTRASOUND GUIDED DIAGNOSTIC AND THERAPEUTIC LEFT THORACENTESIS MEDICATIONS: 10 mL 1% lidocaine  COMPLICATIONS: None immediate. PROCEDURE: An ultrasound guided thoracentesis was thoroughly discussed with the patient and questions answered. The benefits, risks, alternatives and complications were also discussed. The patient understands and wishes to proceed with the procedure. Written consent was obtained. Ultrasound was performed to localize and mark an adequate pocket of fluid in the left chest. The area was then prepped and draped in the normal sterile fashion. 1% Lidocaine  was used for local anesthesia. Under ultrasound guidance a 6 Fr Safe-T-Centesis catheter was introduced. Thoracentesis was performed. Small amount of fluid along with air was aspirated. No further fluid able to be aspirated. The catheter was removed and  a dressing applied. FINDINGS: A total of approximately 50 mL of yellow fluid was removed. Samples were sent to the laboratory as requested by the clinical team. IMPRESSION: Successful ultrasound guided left thoracentesis yielding 50 mL of pleural fluid. Performed and dictated by Kimble Clas, PA-C Electronically Signed   By: Marcey Moan M.D.   On: 09/23/2024 10:09   DG Chest Port 1 View Result Date: 09/22/2024 EXAM: 1 VIEW(S) XRAY  OF THE CHEST 09/22/2024 04:17:00 PM COMPARISON: 09/22/2024 CLINICAL HISTORY: S/P thoracentesis [id number] FINDINGS: LUNGS AND PLEURA: Improved left lung aeration post thoracentesis with small residual left pleural effusion. Left mid to lower lung airspace disease. The right lung is clear. No pneumothorax. HEART AND MEDIASTINUM: Aortic atherosclerosis. Cardiac enlargement. BONES AND SOFT TISSUES: Degenerative changes in the spine and shoulders. IMPRESSION: 1. Improved left lung aeration post thoracentesis with small residual left pleural effusion and left mid to lower lung airspace disease. 2. Cardiac enlargement. Electronically signed by: Elsie Gravely MD 09/22/2024 05:11 PM EST RP Workstation: [id number]   DG Chest 2 View Result Date: 09/22/2024 CLINICAL DATA:  Shortness of breath.  Weakness. EXAM: CHEST - 2 VIEW COMPARISON:  09/18/2024 FINDINGS: Increased size of moderate to large left pleural effusion is seen since previous study. Right lung remains clear. Heart size appears stable. IMPRESSION: Increased size of moderate to large left pleural effusion. Electronically Signed   By: Norleen DELENA Kil M.D.   On: 09/22/2024 12:34   CT ABDOMEN PELVIS W CONTRAST Result Date: 09/18/2024 EXAM: CT ABDOMEN AND PELVIS WITH CONTRAST 09/18/2024 09:42:21 AM TECHNIQUE: CT of the abdomen and pelvis was performed with the administration of 100 mL of iohexol  (OMNIPAQUE ) 300 MG/ML solution. Multiplanar reformatted images are provided for review. Automated exposure control, iterative reconstruction, and/or weight-based adjustment of the mA/kV was utilized to reduce the radiation dose to as low as reasonably achievable. COMPARISON: 07/03/2025 CLINICAL HISTORY: LLQ abdominal pain FINDINGS: LOWER CHEST: Mild left lower lobe airspace opacity left lower lobe airspace disease is present. LIVER: Fatty infiltration of liver is present. No discrete lesions are present. GALLBLADDER AND BILE DUCTS: Gallbladder is unremarkable. No biliary ductal  dilatation. SPLEEN: No acute abnormality. PANCREAS: Chronic fatty infiltration of the pancreas is stable from prior exam. ADRENAL GLANDS: No acute abnormality. KIDNEYS, URETERS AND BLADDER: A simple cyst at the upper pole of the right kidney has increased in size, now measuring 4.9 cm. A simple cyst in the medial aspect of the right kidney measures 2.1 cm. Per consensus, no follow-up is needed for simple Bosniak type 1 and 2 renal cysts, unless the patient has a malignancy history or risk factors. No stones in the kidneys or ureters. No hydronephrosis. No perinephric or periureteral stranding. Urinary bladder is unremarkable. GI AND BOWEL: Stomach demonstrates no acute abnormality. Diverticular changes are present in the distal descending and sigmoid colon without focal inflammation to suggest diverticulitis. There is no bowel obstruction. PERITONEUM AND RETROPERITONEUM: No ascites. No free air. VASCULATURE: Atherosclerotic calcifications are present in the aorta and branch vessels without aneurysm. LYMPH NODES: No lymphadenopathy. REPRODUCTIVE ORGANS: Calculi are present within the prostate. BONES AND SOFT TISSUES: Grade 1 anterolisthesis and uncovertebral broad-based disc protrusion is present at L4-5 with mild central and bilateral foraminal narrowing. A vacuum disc is present at L5-S1. No acute osseous abnormality. No focal soft tissue abnormality. IMPRESSION: 1. No acute findings in the abdomen or pelvis, specifically no evidence of diverticulitis. 2. Mild left lower lobe airspace opacity may represent early infection. . Electronically signed by: Lonni Necessary  MD 09/18/2024 10:46 AM EST RP Workstation: [id number]   DG Chest 2 View Result Date: 09/18/2024 EXAM: 2 VIEW(S) XRAY OF THE CHEST 09/18/2024 07:54:00 AM COMPARISON: 11/22/2021. CLINICAL HISTORY: shortness of breath FINDINGS: LUNGS AND PLEURA: Left basilar opacity, may represent atelectasis or pneumonia. No pleural effusion. No pneumothorax. HEART  AND MEDIASTINUM: Aortic calcification. BONES AND SOFT TISSUES: No acute osseous abnormality. IMPRESSION: 1. Left basilar opacity, which may represent atelectasis or pneumonia. Electronically signed by: Waddell Calk MD 09/18/2024 08:12 AM EST RP Workstation: [id number]    Labs:  CBC: Recent Labs    09/18/24 0519 09/22/24 1107 09/24/24 0826 09/25/24 0322  WBC 7.1 9.7 7.6 6.6  HGB 14.1 12.8* 11.3* 10.9*  HCT 40.0 38.2* 32.4* 31.6*  PLT 278 356 328 315    COAGS: No results for input(s): INR, APTT in the last 8760 hours.  BMP: Recent Labs    09/23/24 0731 09/23/24 1241 09/23/24 1543 09/25/24 0322  NA 136 136 135 135  K 3.6 4.0 3.8 3.8  CL 106 104 104 100  CO2 20* 22 21* 23  GLUCOSE 155* 186* 237* 250*  BUN 12 10 9 12   CALCIUM  9.3 9.6 9.4 9.1  CREATININE 0.76 0.71 0.75 0.83  GFRNONAA >60 >60 >60 >60    LIVER FUNCTION TESTS: Recent Labs    09/18/24 0812 09/22/24 1107 09/24/24 0826  BILITOT 0.3 0.2 0.3  AST 16 21 93*  ALT 6 9 43  ALKPHOS 134* 93 165*  PROT 7.6 7.7 6.6  ALBUMIN 3.9 3.3* 2.7*    TUMOR MARKERS: No results for input(s): AFPTM, CEA, CA199, CHROMGRNA in the last 8760 hours.  Assessment and Plan:  Request for  image guided L chest tube approved for 09/25/24 by Dr. Hughes  No contraindications for procedure identified in ROS, physical exam, or review of pre-sedation considerations. Labs reviewed and within acceptable range 1/7 CT imaging available and reviewed VSS, afebrile as of last documented check Patient not asked to hold any AC/AP for this low bleeding risk procedure Abx managed by IP team Confirmed with Dr. Jude that chest tube will be managed by pulm service after placement  Risks and benefits of chest tube placement were discussed with the patient including bleeding, infection, damage to adjacent structures, malfunction of the tube requiring additional procedures and sepsis.  All of the patient's questions were answered,  patient is agreeable to proceed. Consent signed and in chart.   Thank you for allowing our service to participate in Vivaan Helseth 's care.    Electronically Signed: Laymon Coast, NP   09/25/2024, 8:46 AM     I spent a total of 20 Minutes    in face to face in clinical consultation, greater than 50% of which was counseling/coordinating care for CT guided chest tube placement   (A copy of this note was sent to the referring provider and the time of visit.)  "

## 2024-09-26 ENCOUNTER — Inpatient Hospital Stay (HOSPITAL_COMMUNITY)

## 2024-09-26 DIAGNOSIS — J90 Pleural effusion, not elsewhere classified: Secondary | ICD-10-CM | POA: Diagnosis not present

## 2024-09-26 DIAGNOSIS — E1110 Type 2 diabetes mellitus with ketoacidosis without coma: Secondary | ICD-10-CM | POA: Diagnosis not present

## 2024-09-26 LAB — CBC
HCT: 32.6 % — ABNORMAL LOW (ref 39.0–52.0)
Hemoglobin: 11.2 g/dL — ABNORMAL LOW (ref 13.0–17.0)
MCH: 32.6 pg (ref 26.0–34.0)
MCHC: 34.4 g/dL (ref 30.0–36.0)
MCV: 94.8 fL (ref 80.0–100.0)
Platelets: 330 K/uL (ref 150–400)
RBC: 3.44 MIL/uL — ABNORMAL LOW (ref 4.22–5.81)
RDW: 12.2 % (ref 11.5–15.5)
WBC: 6.5 K/uL (ref 4.0–10.5)
nRBC: 0 % (ref 0.0–0.2)

## 2024-09-26 LAB — BODY FLUID CULTURE W GRAM STAIN: Culture: NO GROWTH

## 2024-09-26 LAB — GLUCOSE, CAPILLARY
Glucose-Capillary: 197 mg/dL — ABNORMAL HIGH (ref 70–99)
Glucose-Capillary: 215 mg/dL — ABNORMAL HIGH (ref 70–99)
Glucose-Capillary: 203 mg/dL — ABNORMAL HIGH (ref 70–99)
Glucose-Capillary: 192 mg/dL — ABNORMAL HIGH (ref 70–99)
Glucose-Capillary: 191 mg/dL — ABNORMAL HIGH (ref 70–99)

## 2024-09-26 LAB — BASIC METABOLIC PANEL WITH GFR
Anion gap: 15 (ref 5–15)
BUN: 11 mg/dL (ref 8–23)
CO2: 22 mmol/L (ref 22–32)
Calcium: 9.1 mg/dL (ref 8.9–10.3)
Chloride: 98 mmol/L (ref 98–111)
Creatinine, Ser: 0.76 mg/dL (ref 0.61–1.24)
GFR, Estimated: >60 mL/min
Glucose, Bld: 213 mg/dL — ABNORMAL HIGH (ref 70–99)
Potassium: 3.6 mmol/L (ref 3.5–5.1)
Sodium: 135 mmol/L (ref 135–145)

## 2024-09-26 MED ORDER — INSULIN ASPART 100 UNIT/ML IJ SOLN
6.0000 [IU] | Freq: Three times a day (TID) | INTRAMUSCULAR | Status: DC
  Administered 2024-09-26 – 2024-10-03 (×19): 6 [IU] via SUBCUTANEOUS
  Filled 2024-09-26 (×19): qty 6

## 2024-09-26 MED ORDER — STERILE WATER FOR INJECTION IJ SOLN
5.0000 mg | Freq: Once | RESPIRATORY_TRACT | Status: AC
  Administered 2024-09-26: 5 mg via INTRAPLEURAL
  Filled 2024-09-26: qty 5

## 2024-09-26 MED ORDER — SODIUM CHLORIDE (PF) 0.9 % IJ SOLN
10.0000 mg | Freq: Once | INTRAMUSCULAR | Status: AC
  Administered 2024-09-26: 10 mg via INTRAPLEURAL
  Filled 2024-09-26 (×2): qty 10

## 2024-09-26 MED ORDER — INSULIN GLARGINE 100 UNIT/ML ~~LOC~~ SOLN
24.0000 [IU] | Freq: Every day | SUBCUTANEOUS | Status: DC
  Administered 2024-09-27 – 2024-10-02 (×6): 24 [IU] via SUBCUTANEOUS
  Filled 2024-09-26 (×7): qty 0.24

## 2024-09-26 NOTE — Procedures (Signed)
 Pleural Fibrinolytic Administration Procedure Note  Kevin Olson  [id number]  09-30-1945  Date:09/26/2024  Time:10:08 AM   Provider Performing:Anan Dapolito D Emilio   Procedure: Pleural Fibrinolysis Subsequent day [phone])  Indication(s) Fibrinolysis of complicated pleural effusion  Consent Risks of the procedure as well as the alternatives and risks of each were explained to the patient and/or caregiver.  Consent for the procedure was obtained.   Anesthesia None   Time Out Verified patient identification, verified procedure, site/side was marked, verified correct patient position, special equipment/implants available, medications/allergies/relevant history reviewed, required imaging and test results available.   Sterile Technique Hand hygiene, gloves   Procedure Description Existing pleural catheter was cleaned and accessed in sterile manner.  10mg  of tPA in 30cc of saline and 5mg  of dornase in 30cc of sterile water  were injected into pleural space using existing pleural catheter.  Catheter will be clamped for 1 hour and then placed back to suction.   Complications/Tolerance None; patient tolerated the procedure well.   EBL None   Specimen(s) None  JD Emilio RIGGERS [facility] 09/26/2024, 10:08 AM  Please see [email or web address] for pager details.  From 7A-7P if no response, please call [phone]. After hours, please call ELink [phone].

## 2024-09-26 NOTE — Evaluation (Signed)
 Physical Therapy Evaluation Patient Details Name: Kevin Olson MRN: [id number] DOB: 1946/01/14 Today's Date: 09/26/2024  History of Present Illness  Kevin Olson is a 79 y.o. male , presented to the ED 09/22/24  with DKA and large left pleural effusion , s/p left thoracentesis, L chest tube placed 1/8.SABRA PMH of diabetes, history of prostate cancer,  Clinical Impression  Pt admitted with above diagnosis.  Pt currently with functional limitations due to the deficits listed below (see PT Problem List). Pt will benefit from acute skilled PT to increase their independence and safety with mobility to allow discharge.       The patient  is alert, presents with left leaning  in sitting and standing at Rw, requiring max assistance for balance to  step to recliner using Rw. Patient was ambulatory with rollator  PTA, resides with supportive wife.  Patient will benefit from continued inpatient follow up therapy, <3 hours/day  SPO2 on RA  > 93%,       If plan is discharge home, recommend the following: Two people to help with walking and/or transfers;A lot of help with bathing/dressing/bathroom   Can travel by private vehicle        Equipment Recommendations None recommended by PT  Recommendations for Other Services       Functional Status Assessment Patient has had a recent decline in their functional status and demonstrates the ability to make significant improvements in function in a reasonable and predictable amount of time.     Precautions / Restrictions Precautions Precautions: Fall Precaution/Restrictions Comments: L chest tube Restrictions Weight Bearing Restrictions Per Provider Order: No      Mobility  Bed Mobility Overal bed mobility: Needs Assistance Bed Mobility: Supine to Sit     Supine to sit: Mod assist, +2 for physical assistance, +2 for safety/equipment     General bed mobility comments: assist with legs and trunk    Transfers Overall transfer level: Needs  assistance Equipment used: Rolling walker (2 wheels) Transfers: Sit to/from Stand, Bed to chair/wheelchair/BSC Sit to Stand: Max assist, +2 physical assistance, +2 safety/equipment   Step pivot transfers: Max assist, +2 physical assistance, +2 safety/equipment       General transfer comment: patient  leaning to the left  when standing, left knee buckling  when stepping  to turn to the recliner, small shuffle steps to turn to the recliner.    Ambulation/Gait                  Stairs            Wheelchair Mobility     Tilt Bed    Modified Rankin (Stroke Patients Only)       Balance Overall balance assessment: History of Falls, Needs assistance Sitting-balance support: Feet supported, Bilateral upper extremity supported Sitting balance-Leahy Scale: Poor   Postural control: Left lateral lean Standing balance support: Reliant on assistive device for balance, During functional activity, Bilateral upper extremity supported Standing balance-Leahy Scale: Poor Standing balance comment: left lateral lean at RW                             Pertinent Vitals/Pain Pain Assessment Pain Assessment: Faces Faces Pain Scale: Hurts even more Pain Location: left side/CT Pain Descriptors / Indicators: Discomfort    Home Living Family/patient expects to be discharged to:: Private residence Living Arrangements: Spouse/significant other Available Help at Discharge: Family;Available 24 hours/day Type of Home: House Home Access: Stairs  to enter Entrance Stairs-Rails: None Entrance Stairs-Number of Steps: 4   Home Layout: Two level;Able to live on main level with bedroom/bathroom Home Equipment: Shower seat;BSC/3in1;Rolling Walker (2 wheels);Cane - single point      Prior Function Prior Level of Function : Independent/Modified Independent             Mobility Comments: Heis using a RW,. 4-5 falls in the past 6 months, due to impaired balance. ADLs Comments:  Modified independent to independent with ADLs. Spouse cooks, cleans and drives.     Extremity/Trunk Assessment        Lower Extremity Assessment Lower Extremity Assessment: Generalized weakness;LLE deficits/detail LLE Deficits / Details: knee flexed/buckling in standing    Cervical / Trunk Assessment Cervical / Trunk Assessment: Kyphotic  Communication   Communication Communication: Impaired Factors Affecting Communication: Reduced clarity of speech    Cognition Arousal: Alert Behavior During Therapy: Flat affect   PT - Cognitive impairments: No apparent impairments                         Following commands: Intact, Impaired Following commands impaired: Follows one step commands with increased time     Cueing Cueing Techniques: Verbal cues, Tactile cues     General Comments      Exercises     Assessment/Plan    PT Assessment Patient needs continued PT services  PT Problem List Decreased strength;Decreased range of motion;Decreased activity tolerance;Decreased balance;Decreased mobility;Decreased knowledge of use of DME;Decreased safety awareness;Decreased knowledge of precautions;Cardiopulmonary status limiting activity;Decreased cognition       PT Treatment Interventions DME instruction;Gait training;Functional mobility training;Therapeutic activities;Patient/family education    PT Goals (Current goals can be found in the Care Plan section)  Acute Rehab PT Goals Patient Stated Goal: to go home PT Goal Formulation: With patient/family Time For Goal Achievement: 10/10/24 Potential to Achieve Goals: Fair    Frequency Min 2X/week     Co-evaluation PT/OT/SLP Co-Evaluation/Treatment: Yes Reason for Co-Treatment: Complexity of the patient's impairments (multi-system involvement);Necessary to address cognition/behavior during functional activity;For patient/therapist safety PT goals addressed during session: Mobility/safety with mobility OT goals  addressed during session: ADL's and self-care       AM-PAC PT 6 Clicks Mobility  Outcome Measure Help needed turning from your back to your side while in a flat bed without using bedrails?: A Lot Help needed moving from lying on your back to sitting on the side of a flat bed without using bedrails?: A Lot Help needed moving to and from a bed to a chair (including a wheelchair)?: A Lot Help needed standing up from a chair using your arms (e.g., wheelchair or bedside chair)?: A Lot Help needed to walk in hospital room?: Total Help needed climbing 3-5 steps with a railing? : Total 6 Click Score: 10    End of Session Equipment Utilized During Treatment: Gait belt Activity Tolerance: Patient limited by fatigue;Patient tolerated treatment well Patient left: in chair;with call bell/phone within reach;with chair alarm set;with family/visitor present Nurse Communication: Mobility status PT Visit Diagnosis: Unsteadiness on feet (R26.81)    Time: 8896-8873 PT Time Calculation (min) (ACUTE ONLY): 23 min   Charges:   PT Evaluation $PT Eval Low Complexity: 1 Low   PT General Charges $$ ACUTE PT VISIT: 1 Visit         Darice Potters PT Acute Rehabilitation Services Office [phone]   Potters Darice Norris 09/26/2024, 1:57 PM

## 2024-09-26 NOTE — Inpatient Diabetes Management (Addendum)
 Inpatient Diabetes Program Recommendations  AACE/ADA: New Consensus Statement on Inpatient Glycemic Control (2015)  Target Ranges:  Prepandial:   less than 140 mg/dL      Peak postprandial:   less than 180 mg/dL (1-2 hours)      Critically ill patients:  140 - 180 mg/dL   Lab Results  Component Value Date   GLUCAP 197 (H) 09/26/2024   HGBA1C 13.2 (H) 09/22/2024    Review of Glycemic Control  Latest Reference Range & Units 09/25/24 07:40 09/25/24 12:12 09/25/24 16:46 09/25/24 21:19 09/26/24 08:03  Glucose-Capillary 70 - 99 mg/dL 749 (H) 807 (H) 758 (H) 196 (H) 197 (H)  (H): Data is abnormally high  Diabetes history: DM2 Outpatient Diabetes medications: Actos  15 mh every day, Januvia 100 mg QD Current orders for Inpatient glycemic control:  Lantus  20 units every day Novolog  0-9 units TID and 0-5 units at bedtime Novolog  5 units TID  Inpatient Diabetes Program Recommendations:    Please consider:  Lantus  24 units every day Novolog  6 units TID with meals if he consumes at least 50%  Met with patient and wife at bedside.  Educated patient on insulin  pen use at home. Reviewed contents of insulin  flexpen starter kit. Reviewed all steps of insulin  pen including attachment of needle, 2-unit air shot, dialing up dose, giving injection, removing needle, disposal of sharps, storage of unused insulin , disposal of insulin  etc. Patient able to provide successful return demonstration. Also reviewed troubleshooting with insulin  pen. MD to give patient Rxs for insulin  pens and insulin  pen needles.  Reviewed hypoglycemia, < 70 mg/dL, signs, symptoms and treatments.  May DC to SNF.  All questions answered.  Thank you, Wyvonna Pinal, MSN, CDCES Diabetes Coordinator Inpatient Diabetes Program [phone] (team pager from 8a-5p)

## 2024-09-26 NOTE — Progress Notes (Signed)
 " PROGRESS NOTE Kevin Olson  FMW:[id number] DOB: 1946-07-27 DOA: 09/22/2024 PCP: Nanci Senior, MD  Brief Narrative/Hospital Course: Kevin Olson is a 79 y.o. male with PMH of diabetes, history of prostate cancer, who has been sick for about 2 week, presented to the ED and found to have with DKA and large left pleural effusion and admitted on 1/5 and s/p left thoracentesis 50 cc yellow fluid, complex on ultrasound. Recent ED visit 1/1-diagnosed with pneumonia and discharged, also noted to be hyperglycemic with high anion gap at that time.  CT abdomen pelvis with contrast on 1/1 no acute finding. DKA resolved. Having intermittent fever 1/6-CT chest done 1/7- Empyema w/  LUL/LL consolidation-possible pneumonia.  Subjective: Seen and examined this morning He is alert awake oriented no new complaints Overnight has been afebrile vital stable, on room air Chest tube in place-labs reviewed this morning stable CBC BMP cbg in 190s  Assessment and plan:   DKA T2DM , non-insulin -dependent, with neuropathy, uncontrolled while resuming Acute metabolic encephalopathy secondary to DKA-resolved: PTA on Januvia and Actos .  Initially treated left-sided pneumonia-likely the trigger for DKA.  A1c 13.2 indicating poor control at baseline. DKA resolved, patient now on Lantus -still with uncontrolled hyperglycemia - increase Lantus  to 24 u, Premeal to 6 u tid, cont ssi 0-9  DM coordinator following - he endorses he can manage insulin  at home.He will need home insulin  will discharge, along with insulin  teaching. Recent Labs  Lab 09/22/24 1621 09/22/24 1733 09/25/24 0740 09/25/24 1212 09/25/24 1646 09/25/24 2119 09/26/24 0803  GLUCAP  --    < > 250* 192* 241* 196* 197*  HGBA1C 13.2*  --   --   --   --   --   --    < > = values in this interval not displayed.   Left loculated complex pleural effusion- suspected empyema Left-sided community-acquired pneumonia-recently treated: No leukocytosis, but having  intermittent fever. S/p  50 cc thoracentesis done 1/5-Gram stain no organism, culture pending, blood culture NGTD. fluid LDH 639 - appears exudative as ldh > 200, checked serum LDH - 134 serum protein 6.6 pleural fluid protein 4.7  CT chest done 1/7-pockets of air noted within no fluid on CT could be due to elevation thoracentesis versus empyema-PCCM consulted  S/p IR  pigtail drain/chest tube placement  and Latrix by pulmonary  Continue chest tube management.  Continue Zosyn   Hypertension Hyperlipidemia: Cardiomegaly per CT Aortic atherosclerosis per CT: PTA valsartan bedtime, BP Boderline controlled. Resume valsartan 1/7 and Lipitor. He is on Repatha  as outpatient.  Chronic anemia on iron supplement: Stable hb. Resumed iron supplement  Hypothyroidism: Continue home Synthroid   On chronic oxycodone /Cymbalta  primidone : Continue same  Acute urinary retention/ OAB W/ incontinence at baseline: Foley catheter placed in the ED- removed 1/7-will be reinstated given need for multiple in and out overnight on 1/7 Cont Flomax ,Gemtesa    Class I Obesity w/ Body mass index is 34.16 kg/m.: Will benefit with PCP follow-up, weight loss,healthy lifestyle and outpatient sleep eval if not done.  Mobility: PT Orders: Active PT Follow up Rec:     DVT prophylaxis: enoxaparin  (LOVENOX ) injection 40 mg Start: 09/23/24 1200 SCDs Start: 09/22/24 1444 Code Status:   Code Status: Limited: Do not attempt resuscitation (DNR) -DNR-LIMITED -Do Not Intubate/DNI  Family Communication: plan of care discussed with patient at bedside. Patient status is: Remains hospitalized because of severity of illness Level of care: Telemetry   Dispo: The patient is from: home w/ wife  Anticipated disposition: TBD-PT OT ordered Objective: Vitals last 24 hrs: Vitals:   09/26/24 0500 09/26/24 0600 09/26/24 0650 09/26/24 0700  BP: (!) 159/55 (!) 151/56  (!) 145/55  Pulse: 65 65  62  Resp: 20 19  (!) 22  Temp:    97.9 F (36.6 C)   TempSrc:   Oral   SpO2: 93% 93%  93%  Weight:      Height:        Physical Examination: General exam:AAOX3, pleasant HEENT:Oral mucosa moist, Ear/Nose WNL grossly Respiratory system: Bilaterally diminished breath sound with chest tube on the left Cardiovascular system: S1 & S2 +, No JVD. Gastrointestinal system: Abdomen soft,NT,ND, BS+ Nervous System: Alert, awake, moving all extremities,and following commands. Extremities: extremities warm, leg edema neg Skin: Warm, no rashes MSK: Normal muscle bulk,tone, power  Foley+  Medications reviewed:  Scheduled Meds:  alteplase  (CATHFLO ACTIVASE ) 10 mg in sodium chloride  (PF) 0.9 % 30 mL  10 mg Intrapleural Once   And   dornase alfa  (PULMOZYME ) 5 mg in sterile water  (preservative free) 30 mL  5 mg Intrapleural Once   atorvastatin   10 mg Oral QHS   Chlorhexidine  Gluconate Cloth  6 each Topical Daily   DULoxetine   60 mg Oral QHS   enoxaparin  (LOVENOX ) injection  40 mg Subcutaneous Q24H   insulin  aspart  0-5 Units Subcutaneous QHS   insulin  aspart  0-9 Units Subcutaneous TID WC   insulin  aspart  5 Units Subcutaneous TID WC   insulin  glargine  20 Units Subcutaneous Daily   irbesartan   37.5 mg Oral Daily   levothyroxine   137 mcg Oral QAC breakfast   mirabegron  ER  25 mg Oral q1800   pantoprazole   40 mg Oral BID   primidone   250 mg Oral BID   primidone   50 mg Oral Q1200   sodium chloride  flush  3 mL Intravenous Q12H   tamsulosin   0.8 mg Oral QHS   Continuous Infusions:  piperacillin -tazobactam (ZOSYN )  IV 3.375 g (09/26/24 0816)   Diet: Diet Order             Diet Carb Modified Room service appropriate? Yes  Diet effective now                   Data Reviewed: I have personally reviewed following labs and imaging studies ( see epic result tab) CBC: Recent Labs  Lab 09/22/24 1107 09/24/24 0826 09/25/24 0322 09/26/24 0337  WBC 9.7 7.6 6.6 6.5  NEUTROABS 8.3*  --   --   --   HGB 12.8* 11.3* 10.9*  11.2*  HCT 38.2* 32.4* 31.6* 32.6*  MCV 98.2 95.3 94.9 94.8  PLT 356 328 315 330   CMP: Recent Labs  Lab 09/23/24 0731 09/23/24 1241 09/23/24 1543 09/25/24 0322 09/26/24 0337  NA 136 136 135 135 135  K 3.6 4.0 3.8 3.8 3.6  CL 106 104 104 100 98  CO2 20* 22 21* 23 22  GLUCOSE 155* 186* 237* 250* 213*  BUN 12 10 9 12 11   CREATININE 0.76 0.71 0.75 0.83 0.76  CALCIUM  9.3 9.6 9.4 9.1 9.1   GFR: Estimated Creatinine Clearance: 96.7 mL/min (by C-G formula based on SCr of 0.76 mg/dL). Recent Labs  Lab 09/22/24 1107 09/24/24 0826  AST 21 93*  ALT 9 43  ALKPHOS 93 165*  BILITOT 0.2 0.3  PROT 7.7 6.6  ALBUMIN 3.3* 2.7*    No results for input(s): LIPASE, AMYLASE in the last 168 hours.  No  results for input(s): AMMONIA in the last 168 hours. Coagulation Profile: No results for input(s): INR, PROTIME in the last 168 hours. Unresulted Labs (From admission, onward)     Start     Ordered   09/23/24 0902  Expectorated Sputum Assessment w Gram Stain, Rflx to Resp Cult  Once,   R        09/23/24 0901           Antimicrobials/Microbiology: Anti-infectives (From admission, onward)    Start     Dose/Rate Route Frequency Ordered Stop   09/24/24 1800  Ampicillin-Sulbactam (UNASYN) 3 g in sodium chloride  0.9 % 100 mL IVPB  Status:  Discontinued        3 g 200 mL/hr over 30 Minutes Intravenous Every 6 hours 09/24/24 1645 09/24/24 1703   09/24/24 1800  piperacillin -tazobactam (ZOSYN ) IVPB 3.375 g        3.375 g 12.5 mL/hr over 240 Minutes Intravenous Every 8 hours 09/24/24 1705     09/24/24 1000  azithromycin  (ZITHROMAX ) tablet 500 mg  Status:  Discontinued        500 mg Oral Daily 09/23/24 1249 09/24/24 1645   09/23/24 1000  cefTRIAXone  (ROCEPHIN ) 2 g in sodium chloride  0.9 % 100 mL IVPB  Status:  Discontinued        2 g 200 mL/hr over 30 Minutes Intravenous Every 24 hours 09/23/24 0901 09/24/24 1645   09/23/24 1000  azithromycin  (ZITHROMAX ) 500 mg in sodium chloride  0.9  % 250 mL IVPB  Status:  Discontinued        500 mg 250 mL/hr over 60 Minutes Intravenous Every 24 hours 09/23/24 0901 09/23/24 1249         Component Value Date/Time   SDES  09/25/2024 1338    PLEURAL Performed at [facility], [address] [place], [place] [address]    SPECREQUEST  09/25/2024 1338    NONE Performed at [facility], [address] [place], [place] [address]    CULT PENDING 09/25/2024 1338   REPTSTATUS PENDING 09/25/2024 1338    Procedures:  Mennie LAMY, MD [facility] 09/26/2024, 8:40 AM   "

## 2024-09-26 NOTE — Evaluation (Signed)
 Occupational Therapy Evaluation Patient Details Name: Kevin Olson MRN: [id number] DOB: 1946/03/20 Today's Date: 09/26/2024   History of Present Illness   Kevin Olson is a 79 yr old male who presented to the ED 09/22/24  with DKA and large left pleural effusion; he is s/p left thoracentesis, L chest tube placed 09/26/23 PMH of diabetes, prostate cancer     Clinical Impressions The pt is currently presenting well below his baseline level of functioning for self-care management; he is typically modified independent to independent with ADLs. He is limited by the below listed deficits (see OT problem list). During the session today, he required mod assist x2 for supine to sit and max assist for sock management/lower body dressing seated EOB. He presented with L sided leaning seated EOB, requiring min assist and frequent cues to correct. He subsequently required mod assist to stand using a RW & max assist x2 using a RW to step-pivot to the bedside chair. His L knee was noted to buckle in standing. He was further noted to be with general deconditioning and generalized weakness. He will benefit from further OT services to maximize his ADL performance and to decrease the risk for further weakness and deconditioning. Patient will benefit from continued inpatient follow up therapy, <3 hours/day.       If plan is discharge home, recommend the following:   Two people to help with walking and/or transfers;A lot of help with bathing/dressing/bathroom;Assistance with cooking/housework;Direct supervision/assist for medications management;Assist for transportation;Help with stairs or ramp for entrance     Functional Status Assessment   Patient has had a recent decline in their functional status and demonstrates the ability to make significant improvements in function in a reasonable and predictable amount of time.     Equipment Recommendations   None recommended by OT     Recommendations for Other  Services         Precautions/Restrictions   Precautions Precautions: Fall Precaution/Restrictions Comments: L chest tube Restrictions Weight Bearing Restrictions Per Provider Order: No     Mobility Bed Mobility Overal bed mobility: Needs Assistance Bed Mobility: Supine to Sit     Supine to sit: Mod assist, +2 for physical assistance, +2 for safety/equipment     General bed mobility comments: assist with legs and trunk    Transfers Overall transfer level: Needs assistance Equipment used: Rolling walker (2 wheels) Transfers: Sit to/from Stand, Bed to chair/wheelchair/BSC Sit to Stand: +2 physical assistance, +2 safety/equipment, Mod assist     Step pivot transfers: Max assist, +2 physical assistance, +2 safety/equipment     General transfer comment: patient  leaning to the left  when standing, left knee buckling, small shuffle steps to turn to the recliner.      Balance     Sitting balance-Leahy Scale: Poor Sitting balance - Comments: L lean noted, with pt requiring min assist and cues to correct     Standing balance-Leahy Scale: Poor Standing balance comment: mod to max assist x2 with RW                ADL either performed or assessed with clinical judgement   ADL Overall ADL's : Needs assistance/impaired Eating/Feeding: Set up;Sitting;Supervision/ safety Eating/Feeding Details (indicate cue type and reason): supported sitting at chair level Grooming: Set up;Supervision/safety Grooming Details (indicate cue type and reason): supported sitting at chair for the pt performing teeth brushing Upper Body Bathing: Minimal assistance;Sitting     Lower Body Bathing Details (indicate cue type and reason): supported sitting  at chair level Upper Body Dressing : Minimal assistance;Sitting Upper Body Dressing Details (indicate cue type and reason): supported sitting at chair level Lower Body Dressing: Maximal assistance;Sitting/lateral leans Lower Body Dressing  Details (indicate cue type and reason): for sock management seated EOB; pt limited by impaired sitting balance/L lean and deconditioning Toilet Transfer: Maximal assistance;BSC/3in1;Rolling walker (2 wheels);Cueing for sequencing;Cueing for safety;Stand-pivot   Toileting- Clothing Manipulation and Hygiene: Maximal assistance;Sit to/from stand;+2 for physical assistance;Cueing for safety;Cueing for sequencing Toileting - Clothing Manipulation Details (indicate cue type and reason): at bedside commode level, based on clinical judgement             Vision                Pertinent Vitals/Pain Pain Assessment Pain Assessment: Faces Pain Location:  (L flank/chest tube site) Pain Descriptors / Indicators: Discomfort Pain Intervention(s): Limited activity within patient's tolerance, Monitored during session, Repositioned     Extremity/Trunk Assessment Upper Extremity Assessment Upper Extremity Assessment: Right hand dominant;RUE deficits/detail;LUE deficits/detail;Generalized weakness RUE Deficits / Details: Significant chronic shoulder AROM limitations, with AROM for shoulder flexion being ~90 degrees. Elbow and hand AROM WFL. Grip strength 4-/5 LUE Deficits / Details: Significant chronic shoulder AROM limitations, with AROM for shoulder flexion being ~90 degrees. Elbow and hand AROM WFL. Grip strength 4-/5   Lower Extremity Assessment Lower Extremity Assessment: Generalized weakness;RLE deficits/detail RLE Deficits / Details: AROM WFL LLE Deficits / Details: knee flexed/buckling in standing      Communication Communication Communication: Impaired Factors Affecting Communication: Reduced clarity of speech   Cognition Arousal: Alert Behavior During Therapy: Flat affect           Attention impairment (select first level of impairment): Divided attention Executive functioning impairment (select all impairments): Problem solving OT - Cognition Comments: Oriented to person,  place, and time. He occasionally attempted tasks in a hurried manner, requiring cues for general safety                 Following commands: Impaired Following commands impaired: Follows one step commands with increased time     Cueing  General Comments   Cueing Techniques: Verbal cues;Tactile cues              Home Living Family/patient expects to be discharged to:: Private residence Living Arrangements: Spouse/significant other Available Help at Discharge: Family Type of Home: House Home Access: Stairs to enter   Entrance Stairs-Rails: None Home Layout: Two level;Able to live on main level with bedroom/bathroom     Bathroom Shower/Tub: Walk-in shower         Home Equipment: Control And Instrumentation Engineer (2 wheels);Cane - single point          Prior Functioning/Environment Prior Level of Function : Independent/Modified Independent             Mobility Comments: Heis using a RW,. 4-5 falls in the past 6 months, due to impaired balance. ADLs Comments: Modified independent to independent with ADLs. Spouse cooks, cleans and drives.    OT Problem List: Decreased strength;Decreased range of motion;Decreased activity tolerance;Impaired balance (sitting and/or standing);Decreased coordination;Decreased knowledge of use of DME or AE   OT Treatment/Interventions: Self-care/ADL training;Therapeutic exercise;Energy conservation;DME and/or AE instruction;Therapeutic activities;Balance training;Patient/family education      OT Goals(Current goals can be found in the care plan section)   Acute Rehab OT Goals Patient Stated Goal: pt desires to get stronger OT Goal Formulation: With patient/family Time For Goal Achievement: 10/10/24 Potential to Achieve Goals: Good ADL  Goals Pt Will Perform Upper Body Bathing: with set-up;sitting;with supervision Pt Will Perform Upper Body Dressing: with supervision;with set-up;sitting Pt Will Perform Lower Body Dressing:  with min assist;sitting/lateral leans;with adaptive equipment;sit to/from stand Pt Will Transfer to Toilet: with contact guard assist;bedside commode;stand pivot transfer Additional ADL Goal #1: The pt will perform bed mobility with CGA, in prep for progressive ADL participation.   OT Frequency:  Min 2X/week    Co-evaluation PT/OT/SLP Co-Evaluation/Treatment: Yes Reason for Co-Treatment: Complexity of the patient's impairments (multi-system involvement);Necessary to address cognition/behavior during functional activity;For patient/therapist safety PT goals addressed during session: Mobility/safety with mobility OT goals addressed during session: ADL's and self-care      AM-PAC OT 6 Clicks Daily Activity     Outcome Measure Help from another person eating meals?: A Little Help from another person taking care of personal grooming?: A Little Help from another person toileting, which includes using toliet, bedpan, or urinal?: A Lot Help from another person bathing (including washing, rinsing, drying)?: A Lot Help from another person to put on and taking off regular upper body clothing?: A Little Help from another person to put on and taking off regular lower body clothing?: A Lot 6 Click Score: 15   End of Session Nurse Communication: Mobility status  Activity Tolerance: Patient limited by fatigue Patient left: in chair;with call bell/phone within reach;with chair alarm set  OT Visit Diagnosis: Unsteadiness on feet (R26.81);Other abnormalities of gait and mobility (R26.89);Muscle weakness (generalized) (M62.81)                Time: 8896-8873 OT Time Calculation (min): 23 min Charges:  OT General Charges $OT Visit: 1 Visit OT Evaluation $OT Eval Moderate Complexity: 1 Mod    Laurissa Cowper J Harris, OTR/L 09/26/2024, 4:25 PM

## 2024-09-26 NOTE — Progress Notes (Signed)
" ° °  NAME:  Kevin Olson, MRN:  [id number], DOB:  08-18-1946, LOS: 4 ADMISSION DATE:  09/22/2024, CONSULTATION DATE:  1/7 REFERRING MD:  1/7, CHIEF COMPLAINT:  L empyema   History of Present Illness:  Patient is a 79 yo M  presents to [facility] ED on 1/5 w/ Left pleural effusion and DKA. He is on Januvia and Actos , HbA1c 13.2.  He had urinary retention and Foley catheter was placed. He underwent thoracentesis on 1/5, only 50 cc of yellow fluid was removed . Pleural fluid showed total protein 4.7, LDH 639, 260 cells 92% neutrophils .  Cultures negative He had low-grade fever 100.5 today, CT chest was obtained which showed moderate loculated left pleural effusion with gas collections within the pleural fluid and consolidation of left upper and lower lobes. Hence PCCM consulted.  Patient reports dry cough and left-sided chest pain   Pertinent  Medical History   DMT2, prostate cancer, HTN, HLD    Past Medical History:  Diagnosis Date   Depression    DM2 (diabetes mellitus, type 2) (HCC)    Dyslipidemia    Family history of brain cancer    Family history of pancreatic cancer    Hypertension    OA (osteoarthritis) of knee    Prostate cancer (HCC)    prostate     Significant Hospital Events: Including procedures, antibiotic start and stop dates in addition to other pertinent events   1/5 admit w/ L pleural effusion; only 50 mL of fluid removed 1/7 pccm consulted for empyema; no good pockets appreciated on US  1/8 IR to place CT guided Chest tube; given tube lytics w/ 350 outpt from chest tube  Interim History / Subjective:  Given tube lytics; 350 outpt from chest tube last 24 hours  Objective    Blood pressure (!) 145/55, pulse 62, temperature 97.9 F (36.6 C), temperature source Oral, resp. rate (!) 22, height 5' 10 (1.778 m), weight 108 kg, SpO2 93%.        Intake/Output Summary (Last 24 hours) at 09/26/2024 0819 Last data filed at 09/26/2024 0815 Gross per 24 hour  Intake 389.72  ml  Output 1625 ml  Net -1235.28 ml   Filed Weights   09/22/24 1031  Weight: 108 kg    Examination: General:  NAD HEENT: MM pink/moist Neuro: Aox3; MAE CV: s1s2, RRR, no m/r/g PULM:  BS more dim on left; room air; left chest tube in place GI: soft, bsx4 active  Extremities: warm/dry, no edema  Skin: no rashes or lesions   Resolved problem list   Assessment and Plan   Left loculated complex pleural effusion -pockets of air noted within no fluid on CT could be result of recent thoracentesis versus empyema.  He had low-grade fever, no leukocytosis Plan: -cont CT -repeat lytics today -monitor CT outpt; cont to suction -cont zosyn  -follow culture -trend CXR    JD Emilio DEVONNA [facility] 09/26/2024, 8:19 AM  Please see [email or web address] for pager details.  From 7A-7P if no response, please call [phone]. After hours, please call ELink [phone].         "

## 2024-09-27 ENCOUNTER — Inpatient Hospital Stay (HOSPITAL_COMMUNITY)

## 2024-09-27 DIAGNOSIS — J90 Pleural effusion, not elsewhere classified: Secondary | ICD-10-CM | POA: Diagnosis not present

## 2024-09-27 DIAGNOSIS — E1110 Type 2 diabetes mellitus with ketoacidosis without coma: Secondary | ICD-10-CM | POA: Diagnosis not present

## 2024-09-27 LAB — CBC
HCT: 31.4 % — ABNORMAL LOW (ref 39.0–52.0)
Hemoglobin: 10.6 g/dL — ABNORMAL LOW (ref 13.0–17.0)
MCH: 31.7 pg (ref 26.0–34.0)
MCHC: 33.8 g/dL (ref 30.0–36.0)
MCV: 94 fL (ref 80.0–100.0)
Platelets: 350 K/uL (ref 150–400)
RBC: 3.34 MIL/uL — ABNORMAL LOW (ref 4.22–5.81)
RDW: 12.2 % (ref 11.5–15.5)
WBC: 7.5 K/uL (ref 4.0–10.5)
nRBC: 0 % (ref 0.0–0.2)

## 2024-09-27 LAB — BASIC METABOLIC PANEL WITH GFR
Anion gap: 15 (ref 5–15)
BUN: 13 mg/dL (ref 8–23)
CO2: 22 mmol/L (ref 22–32)
Calcium: 9.1 mg/dL (ref 8.9–10.3)
Chloride: 97 mmol/L — ABNORMAL LOW (ref 98–111)
Creatinine, Ser: 0.82 mg/dL (ref 0.61–1.24)
GFR, Estimated: >60 mL/min
Glucose, Bld: 208 mg/dL — ABNORMAL HIGH (ref 70–99)
Potassium: 3.5 mmol/L (ref 3.5–5.1)
Sodium: 134 mmol/L — ABNORMAL LOW (ref 135–145)

## 2024-09-27 LAB — GLUCOSE, CAPILLARY
Glucose-Capillary: 175 mg/dL — ABNORMAL HIGH (ref 70–99)
Glucose-Capillary: 270 mg/dL — ABNORMAL HIGH (ref 70–99)
Glucose-Capillary: 254 mg/dL — ABNORMAL HIGH (ref 70–99)
Glucose-Capillary: 221 mg/dL — ABNORMAL HIGH (ref 70–99)

## 2024-09-27 MED ORDER — SODIUM CHLORIDE 0.9% FLUSH
10.0000 mL | Freq: Three times a day (TID) | INTRAVENOUS | Status: DC
  Administered 2024-09-27 – 2024-09-29 (×6): 10 mL via INTRAPLEURAL

## 2024-09-27 MED ORDER — SODIUM CHLORIDE (PF) 0.9 % IJ SOLN
10.0000 mg | Freq: Once | INTRAMUSCULAR | Status: AC
  Administered 2024-09-27: 10 mg via INTRAPLEURAL
  Filled 2024-09-27: qty 10

## 2024-09-27 MED ORDER — STERILE WATER FOR INJECTION IJ SOLN
5.0000 mg | Freq: Once | RESPIRATORY_TRACT | Status: AC
  Administered 2024-09-27: 5 mg via INTRAPLEURAL
  Filled 2024-09-27: qty 5

## 2024-09-27 NOTE — Progress Notes (Signed)
 " PROGRESS NOTE Kevin Olson  FMW:[id number] DOB: Oct 26, 1945 DOA: 09/22/2024 PCP: Nanci Senior, MD  Brief Narrative/Hospital Course: Kevin Olson is a 79 y.o. male with PMH of diabetes, history of prostate cancer, who has been sick for about 2 week, presented to the ED and found to have with DKA and large left pleural effusion and admitted on 1/5 and s/p left thoracentesis 50 cc yellow fluid, complex on ultrasound. Recent ED visit 1/1-diagnosed with pneumonia and discharged, also noted to be hyperglycemic with high anion gap at that time.  CT abdomen pelvis with contrast on 1/1 no acute finding. DKA resolved. Having intermittent fever 1/6-CT chest done 1/7- Empyema w/  LUL/LL consolidation-possible pneumonia. PCCM consulted and IR placed pigtail catheter/chest tube 1/8 S/P pleural lytics PCCM.  Subjective: Seen and examined  Reports he is feeling better, no complaints Overnight remains afebrile mild tachypnea otherwise vital stable on room air Labs showed stable CBC BMP blood sugar 170s  Assessment and plan:  Left loculated complex pleural effusion- suspected empyema Left-sided community-acquired pneumonia-recently treated: W/  having intermittent fever. S/p  50 cc thoracentesis done 1/5-cx negative, blood culture NGTD. fluid LDH 639 - is exudative-serum LDH -134 serum protein 6.6 pleural fluid protein 4.7-patient having intermittent fever. 1/7 CT chest:pockets of air noted within no fluid on CT could be due to elevation thoracentesis versus empyema-PCCM consulted  1/8:S/p IR  pigtail drain/chest tube placement - pleural fluid culture NGTD,  S/P lytics 1/8 and 1/9 per PCCM.  Chest x-ray this morning low volume failure with persistent left base collapse/consolidation and effusion Continue chest tube management, pulmonary toileting per PCCM. 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  Initially treated left-sided pneumonia-likely the trigger for DKA.  A1c 13.2 indicating poor control at baseline. DKA resolved, patient now on Lantus  blood sugar remains well-controlled continue Lantus  to 24 u, Premeal 6 u tid, ssi 0-9  DM coordinator following-continue insulin  diabetic teaching.  Patient reports she will be able to use insulin  at home. Recent Labs  Lab 09/22/24 1621 09/22/24 1733 09/26/24 1120 09/26/24 1558 09/26/24 1933 09/26/24 2204 09/27/24 0807  GLUCAP  --    < > 215* 203* 192* 191* 175*  HGBA1C 13.2*  --   --   --   --   --   --    < > = values in this interval not displayed.   Hypertension Hyperlipidemia: Cardiomegaly per CT Aortic atherosclerosis per CT: PTA valsartan bedtime, BP Boderline controlled. Resumed valsartan 1/7 and Lipitor. He is on Repatha  as outpatient.  Chronic anemia on iron supplement: Stable hb. Resumed iron supplement  Hypothyroidism: Continue home Synthroid   On chronic oxycodone /Cymbalta  primidone : Continue same  Acute urinary retention/ OAB W/ incontinence at baseline: Foley catheter placed in the ED- removed 1/7- and reinstated given need for multiple in and out overnight on 1/7 Cont Flomax ,Gemtesa    Class I Obesity w/ Body mass index is 34.16 kg/m.: Will benefit with PCP follow-up, weight loss,healthy lifestyle and outpatient sleep eval if not done.  Mobility: PT Orders: Active PT Follow up Rec: Skilled Nursing-Short Term Rehab (<3 Hours/Day)09/26/2024 1354    DVT prophylaxis: enoxaparin  (LOVENOX ) injection 40 mg Start: 09/23/24 1200 SCDs Start: 09/22/24 1444 Code Status:   Code Status: Limited: Do not attempt resuscitation (DNR) -DNR-LIMITED -Do Not Intubate/DNI  Family Communication: plan of care discussed with patient at bedside. Patient status is: Remains hospitalized because of  severity of illness Level of care: Telemetry   Dispo: The patient is from: home w/ wife             Anticipated disposition: TBD-PT OT ordered Objective: Vitals last 24 hrs: Vitals:   09/27/24 0630 09/27/24 0645 09/27/24 0700 09/27/24 0800  BP:   (!) 144/73 (!) 147/60  Pulse: 64 65 64 72  Resp: (!) 24 (!) 25 20 20   Temp:      TempSrc:      SpO2: 92% 92% 92% 92%  Weight:      Height:        Physical Examination: General exam:AAOX3, HEENT:Oral mucosa moist, Ear/Nose WNL grossly Respiratory system: Bilaterally clear breath sounds with diminished in the left, chest tube w/ water  seal Cardiovascular system: S1 & S2 +, No JVD. Gastrointestinal system: Abdomen soft,NT,ND, BS+ Nervous System: Alert, awake, moving all extremities,and following commands. Extremities: extremities warm, leg edema neg Skin: Warm, no rashes MSK: Normal muscle bulk,tone, power  Foley+  Medications reviewed:  Scheduled Meds:  atorvastatin   10 mg Oral QHS   Chlorhexidine  Gluconate Cloth  6 each Topical Daily   DULoxetine   60 mg Oral QHS   enoxaparin  (LOVENOX ) injection  40 mg Subcutaneous Q24H   insulin  aspart  0-5 Units Subcutaneous QHS   insulin  aspart  0-9 Units Subcutaneous TID WC   insulin  aspart  6 Units Subcutaneous TID WC   insulin  glargine  24 Units Subcutaneous Daily   irbesartan   37.5 mg Oral Daily   levothyroxine   137 mcg Oral QAC breakfast   mirabegron  ER  25 mg Oral q1800   pantoprazole   40 mg Oral BID   primidone   250 mg Oral BID   primidone   50 mg Oral Q1200   sodium chloride  flush  3 mL Intravenous Q12H   tamsulosin   0.8 mg Oral QHS   Continuous Infusions:  piperacillin -tazobactam (ZOSYN )  IV Stopped (09/27/24 0615)   Diet: Diet Order             Diet Carb Modified Room service appropriate? Yes  Diet effective now                   Data Reviewed: I have personally reviewed following labs and imaging studies ( see epic result tab) CBC: Recent Labs  Lab 09/22/24 1107 09/24/24 0826 09/25/24 0322 09/26/24 0337 09/27/24 0257  WBC 9.7 7.6 6.6 6.5 7.5  NEUTROABS 8.3*   --   --   --   --   HGB 12.8* 11.3* 10.9* 11.2* 10.6*  HCT 38.2* 32.4* 31.6* 32.6* 31.4*  MCV 98.2 95.3 94.9 94.8 94.0  PLT 356 328 315 330 350   CMP: Recent Labs  Lab 09/23/24 1241 09/23/24 1543 09/25/24 0322 09/26/24 0337 09/27/24 0257  NA 136 135 135 135 134*  K 4.0 3.8 3.8 3.6 3.5  CL 104 104 100 98 97*  CO2 22 21* 23 22 22   GLUCOSE 186* 237* 250* 213* 208*  BUN 10 9 12 11 13   CREATININE 0.71 0.75 0.83 0.76 0.82  CALCIUM  9.6 9.4 9.1 9.1 9.1   GFR: Estimated Creatinine Clearance: 94.3 mL/min (by C-G formula based on SCr of 0.82 mg/dL). Recent Labs  Lab 09/22/24 1107 09/24/24 0826  AST 21 93*  ALT 9 43  ALKPHOS 93 165*  BILITOT 0.2 0.3  PROT 7.7 6.6  ALBUMIN 3.3* 2.7*    No results for input(s): LIPASE, AMYLASE in the last 168 hours.  No results for input(s): AMMONIA in  the last 168 hours. Coagulation Profile: No results for input(s): INR, PROTIME in the last 168 hours. Unresulted Labs (From admission, onward)     Start     Ordered   09/23/24 0902  Expectorated Sputum Assessment w Gram Stain, Rflx to Resp Cult  Once,   R        09/23/24 0901           Antimicrobials/Microbiology: Anti-infectives (From admission, onward)    Start     Dose/Rate Route Frequency Ordered Stop   09/24/24 1800  Ampicillin-Sulbactam (UNASYN) 3 g in sodium chloride  0.9 % 100 mL IVPB  Status:  Discontinued        3 g 200 mL/hr over 30 Minutes Intravenous Every 6 hours 09/24/24 1645 09/24/24 1703   09/24/24 1800  piperacillin -tazobactam (ZOSYN ) IVPB 3.375 g        3.375 g 12.5 mL/hr over 240 Minutes Intravenous Every 8 hours 09/24/24 1705     09/24/24 1000  azithromycin  (ZITHROMAX ) tablet 500 mg  Status:  Discontinued        500 mg Oral Daily 09/23/24 1249 09/24/24 1645   09/23/24 1000  cefTRIAXone  (ROCEPHIN ) 2 g in sodium chloride  0.9 % 100 mL IVPB  Status:  Discontinued        2 g 200 mL/hr over 30 Minutes Intravenous Every 24 hours 09/23/24 0901 09/24/24 1645    09/23/24 1000  azithromycin  (ZITHROMAX ) 500 mg in sodium chloride  0.9 % 250 mL IVPB  Status:  Discontinued        500 mg 250 mL/hr over 60 Minutes Intravenous Every 24 hours 09/23/24 0901 09/23/24 1249         Component Value Date/Time   SDES  09/25/2024 1338    PLEURAL Performed at [facility], [address] [place], [place] [address]    SPECREQUEST  09/25/2024 1338    NONE Performed at [facility], [address] [place], [place] [address]    CULT  09/25/2024 1338    NO GROWTH < 24 HOURS Performed at [facility], [address] [place], [place] [address]    REPTSTATUS PENDING 09/25/2024 1338    Procedures:  Mennie LAMY, MD [facility] 09/27/2024, 9:10 AM   "

## 2024-09-27 NOTE — Procedures (Signed)
 Pleural Fibrinolytic Administration Procedure Note  Kevin Olson  [id number]  May 10, 1946  Date:09/27/2024  Time:3:35 PM   Provider Performing:Brandt Chaney   Procedure: Pleural Fibrinolysis Subsequent day [phone])  Indication(s) Fibrinolysis of complicated pleural effusion  Consent Risks of the procedure as well as the alternatives and risks of each were explained to the patient and/or caregiver.  Consent for the procedure was obtained.   Anesthesia None   Time Out Verified patient identification, verified procedure, site/side was marked, verified correct patient position, special equipment/implants available, medications/allergies/relevant history reviewed, required imaging and test results available.   Sterile Technique Hand hygiene, gloves   Procedure Description Existing pleural catheter was cleaned and accessed in sterile manner.  10mg  of tPA in 30cc of saline and 5mg  of dornase in 30cc of sterile water  were injected into pleural space using existing pleural catheter.  Catheter will be clamped for 1 hour and then placed back to suction.   Complications/Tolerance None; patient tolerated the procedure well.   EBL None   Specimen(s) None  I personally saw the patient and performed a substantive portion of this encounter, including a complete performance of at least one of the key components (MDM, Hx and/or Exam), in conjunction with the Advanced Practice Provider/ resident   Lonna Coder MD [place] [facility] See Amion for pager  If no response to pager , please call [phone] until 7pm After 7:00 pm call Elink  [phone] 09/27/2024, 3:35 PM

## 2024-09-27 NOTE — Progress Notes (Addendum)
 eLink Physician-Brief Progress Note Patient Name: Kevin Olson DOB: [birth year] MRN: [id number]   Date of Service  09/27/2024  HPI/Events of Note  Chest tube in place with sediment buildup  eICU Interventions  Add chest tube flushes every 8 hours CXR in AM     Intervention Category Minor Interventions: Routine modifications to care plan (e.g. PRN medications for pain, fever)  Chelan Heringer 09/27/2024, 10:29 PM

## 2024-09-27 NOTE — Progress Notes (Signed)
 After finbrinolytic administration, patient requiring increasing oxygen requirement (2 liters) and respirations have increased to the low 30s. When chest tube was unclamped 200 cc of bright red blood came out. Dr. Theophilus notified of findings with recommendation to watch patient on stepdown for tonight.

## 2024-09-28 ENCOUNTER — Inpatient Hospital Stay (HOSPITAL_COMMUNITY)

## 2024-09-28 DIAGNOSIS — J90 Pleural effusion, not elsewhere classified: Secondary | ICD-10-CM | POA: Diagnosis not present

## 2024-09-28 DIAGNOSIS — E1110 Type 2 diabetes mellitus with ketoacidosis without coma: Secondary | ICD-10-CM | POA: Diagnosis not present

## 2024-09-28 LAB — CULTURE, BLOOD (ROUTINE X 2)
Culture: NO GROWTH
Culture: NO GROWTH
Special Requests: ADEQUATE
Special Requests: ADEQUATE

## 2024-09-28 LAB — BASIC METABOLIC PANEL WITH GFR
Anion gap: 13 (ref 5–15)
BUN: 17 mg/dL (ref 8–23)
CO2: 23 mmol/L (ref 22–32)
Calcium: 8.9 mg/dL (ref 8.9–10.3)
Chloride: 99 mmol/L (ref 98–111)
Creatinine, Ser: 1.23 mg/dL (ref 0.61–1.24)
GFR, Estimated: >60 mL/min
Glucose, Bld: 199 mg/dL — ABNORMAL HIGH (ref 70–99)
Potassium: 3.5 mmol/L (ref 3.5–5.1)
Sodium: 134 mmol/L — ABNORMAL LOW (ref 135–145)

## 2024-09-28 LAB — CBC
HCT: 32.1 % — ABNORMAL LOW (ref 39.0–52.0)
Hemoglobin: 11 g/dL — ABNORMAL LOW (ref 13.0–17.0)
MCH: 32.5 pg (ref 26.0–34.0)
MCHC: 34.3 g/dL (ref 30.0–36.0)
MCV: 95 fL (ref 80.0–100.0)
Platelets: 417 K/uL — ABNORMAL HIGH (ref 150–400)
RBC: 3.38 MIL/uL — ABNORMAL LOW (ref 4.22–5.81)
RDW: 12.4 % (ref 11.5–15.5)
WBC: 8.4 K/uL (ref 4.0–10.5)
nRBC: 0 % (ref 0.0–0.2)

## 2024-09-28 LAB — GLUCOSE, CAPILLARY
Glucose-Capillary: 186 mg/dL — ABNORMAL HIGH (ref 70–99)
Glucose-Capillary: 263 mg/dL — ABNORMAL HIGH (ref 70–99)
Glucose-Capillary: 133 mg/dL — ABNORMAL HIGH (ref 70–99)
Glucose-Capillary: 200 mg/dL — ABNORMAL HIGH (ref 70–99)

## 2024-09-28 MED ORDER — PNEUMOCOCCAL 20-VAL CONJ VACC 0.5 ML IM SUSY: 0.5000 mL | PREFILLED_SYRINGE | Freq: Once | INTRAMUSCULAR | Status: DC | PRN

## 2024-09-28 MED ORDER — SODIUM CHLORIDE 0.9 % IV SOLN: INTRAVENOUS | Status: AC

## 2024-09-28 NOTE — Progress Notes (Signed)
" ° °  NAME:  Kevin Olson, MRN:  [id number], DOB:  08/27/1946, LOS: 6 ADMISSION DATE:  09/22/2024, CONSULTATION DATE:  1/7 REFERRING MD:  1/7, CHIEF COMPLAINT:  L empyema   History of Present Illness:  Patient is a 79 yo M  presents to [facility] ED on 1/5 w/ Left pleural effusion and DKA. He is on Januvia and Actos , HbA1c 13.2.  He had urinary retention and Foley catheter was placed. He underwent thoracentesis on 1/5, only 50 cc of yellow fluid was removed . Pleural fluid showed total protein 4.7, LDH 639, 260 cells 92% neutrophils .  Cultures negative He had low-grade fever 100.5 today, CT chest was obtained which showed moderate loculated left pleural effusion with gas collections within the pleural fluid and consolidation of left upper and lower lobes. Hence PCCM consulted.  Patient reports dry cough and left-sided chest pain   Pertinent  Medical History   DMT2, prostate cancer, HTN, HLD    Past Medical History:  Diagnosis Date   Depression    DM2 (diabetes mellitus, type 2) (HCC)    Dyslipidemia    Family history of brain cancer    Family history of pancreatic cancer    Hypertension    OA (osteoarthritis) of knee    Prostate cancer (HCC)    prostate     Significant Hospital Events: Including procedures, antibiotic start and stop dates in addition to other pertinent events   1/5 admit w/ L pleural effusion; only 50 mL of fluid removed 1/7 pccm consulted for empyema; no good pockets appreciated on US  1/8 IR to place CT guided Chest tube; given tube lytics w/ 350 outpt from chest tube 1/9-second dose of pleural lytics given  Interim History / Subjective:   More confused today.  Over 300 cc serosanguineous output from chest  Objective    Blood pressure (!) 149/55, pulse 68, temperature 99.4 F (37.4 C), temperature source Axillary, resp. rate (!) 21, height 5' 10 (1.778 m), weight 108 kg, SpO2 95%.        Intake/Output Summary (Last 24 hours) at 09/28/2024 0801 Last data  filed at 09/28/2024 0630 Gross per 24 hour  Intake 388.33 ml  Output 1255 ml  Net -866.67 ml   Filed Weights   09/22/24 1031  Weight: 108 kg    Examination: Awake, mild confusion Lungs are clear Heart regular rate and rhythm Resolved problem list   Assessment and Plan   Left loculated complex pleural effusion -pockets of air noted within no fluid on CT could be result of recent thoracentesis versus empyema.  He had low-grade fever, no leukocytosis Plan: Repeat third dose of lytics Continue antibiotics Chest tube to suction Follow chest x-ray  Lonna Coder MD [facility] See Amion for pager  If no response to pager , please call [phone] until 7pm After 7:00 pm call Elink  [phone] 09/28/2024, 8:01 AM         "

## 2024-09-28 NOTE — Plan of Care (Signed)
" °  Problem: Metabolic: Goal: Ability to maintain appropriate glucose levels will improve Outcome: Progressing   Problem: Nutritional: Goal: Maintenance of adequate nutrition will improve Outcome: Progressing   Problem: Cardiac: Goal: Ability to maintain an adequate cardiac output will improve Outcome: Progressing   Problem: Clinical Measurements: Goal: Diagnostic test results will improve Outcome: Progressing Goal: Respiratory complications will improve Outcome: Progressing Goal: Cardiovascular complication will be avoided Outcome: Progressing   Problem: Elimination: Goal: Will not experience complications related to bowel motility Outcome: Progressing Goal: Will not experience complications related to urinary retention Outcome: Progressing   "

## 2024-09-28 NOTE — Plan of Care (Signed)
" °  Problem: Education: Goal: Ability to describe self-care measures that may prevent or decrease complications (Diabetes Survival Skills Education) will improve Outcome: Progressing Goal: Individualized Educational Video(s) Outcome: Progressing   Problem: Coping: Goal: Ability to adjust to condition or change in health will improve Outcome: Progressing   Problem: Fluid Volume: Goal: Ability to maintain a balanced intake and output will improve Outcome: Progressing   Problem: Health Behavior/Discharge Planning: Goal: Ability to identify and utilize available resources and services will improve Outcome: Progressing Goal: Ability to manage health-related needs will improve Outcome: Progressing   Problem: Nutritional: Goal: Maintenance of adequate nutrition will improve Outcome: Progressing Goal: Progress toward achieving an optimal weight will improve Outcome: Progressing   Problem: Metabolic: Goal: Ability to maintain appropriate glucose levels will improve Outcome: Progressing   Problem: Education: Goal: Ability to describe self-care measures that may prevent or decrease complications (Diabetes Survival Skills Education) will improve Outcome: Progressing Goal: Individualized Educational Video(s) Outcome: Progressing   Problem: Health Behavior/Discharge Planning: Goal: Ability to identify and utilize available resources and services will improve Outcome: Progressing Goal: Ability to manage health-related needs will improve Outcome: Progressing   Problem: Cardiac: Goal: Ability to maintain an adequate cardiac output will improve Outcome: Progressing   "

## 2024-09-28 NOTE — Progress Notes (Signed)
" ° °  NAME:  Kevin Olson, MRN:  [id number], DOB:  1946/03/30, LOS: 6 ADMISSION DATE:  09/22/2024, CONSULTATION DATE:  1/7 REFERRING MD:  1/7, CHIEF COMPLAINT:  L empyema   History of Present Illness:  Patient is a 79 yo M  presents to [facility] ED on 1/5 w/ Left pleural effusion and DKA. He is on Januvia and Actos , HbA1c 13.2.  He had urinary retention and Foley catheter was placed. He underwent thoracentesis on 1/5, only 50 cc of yellow fluid was removed . Pleural fluid showed total protein 4.7, LDH 639, 260 cells 92% neutrophils .  Cultures negative He had low-grade fever 100.5 today, CT chest was obtained which showed moderate loculated left pleural effusion with gas collections within the pleural fluid and consolidation of left upper and lower lobes. Hence PCCM consulted.  Patient reports dry cough and left-sided chest pain   Pertinent  Medical History   DMT2, prostate cancer, HTN, HLD  Past Medical History:  Diagnosis Date   Depression    DM2 (diabetes mellitus, type 2) (HCC)    Dyslipidemia    Family history of brain cancer    Family history of pancreatic cancer    Hypertension    OA (osteoarthritis) of knee    Prostate cancer (HCC)    prostate     Significant Hospital Events: Including procedures, antibiotic start and stop dates in addition to other pertinent events   1/5 admit w/ L pleural effusion; only 50 mL of fluid removed 1/7 pccm consulted for empyema; no good pockets appreciated on US  1/8 IR to place CT guided Chest tube; given tube lytics w/ 350 outpt from chest tube 1/9-second dose of pleural lytics given 1/10 third dose lytics given.  Chest tube output is now bloody.  Interim History / Subjective:   Third dose lytics given.  Had immediate output of 200 cc of bright red blood Total output 580 cc from chest tube over 24 hours  Objective    Blood pressure (!) 149/55, pulse 68, temperature 99.4 F (37.4 C), temperature source Axillary, resp. rate (!) 21, height 5'  10 (1.778 m), weight 108 kg, SpO2 95%.        Intake/Output Summary (Last 24 hours) at 09/28/2024 0803 Last data filed at 09/28/2024 0630 Gross per 24 hour  Intake 388.33 ml  Output 1255 ml  Net -866.67 ml   Filed Weights   09/22/24 1031  Weight: 108 kg    Examination: Awake, oriented Lungs are clear to auscultation Heart rate regular rate and rhythm   Resolved problem list   Assessment and Plan   Left loculated complex pleural effusion -pockets of air noted within no fluid on CT could be result of recent thoracentesis versus empyema.  He had low-grade fever, no leukocytosis Plan: Given 3 doses of lytics.  Will hold further lytic therapy as chest tube output is bloody Get CT chest today to reevaluate Keep chest tube in place to suction until output is less than 150 cc/day Continue Zosyn   Wadie Liew MD [facility] See Amion for pager  If no response to pager , please call [phone] until 7pm After 7:00 pm call Elink  [phone] 09/28/2024, 8:03 AM         "

## 2024-09-28 NOTE — Progress Notes (Signed)
 " PROGRESS NOTE Linville Decarolis  FMW:[id number] DOB: [birth year] DOA: 09/22/2024 PCP: Nanci Senior, MD  Brief Narrative/Hospital Course: Kevin Olson is a 79 y.o. male with PMH of diabetes, history of prostate cancer, who has been sick for about 2 week, presented to the ED and found to have with DKA and large left pleural effusion and admitted on 1/5 and s/p left thoracentesis 50 cc yellow fluid, complex on ultrasound. Recent ED visit 1/1-diagnosed with pneumonia and discharged, also noted to be hyperglycemic with high anion gap at that time.  CT abdomen pelvis with contrast on 1/1 no acute finding. DKA resolved. Having intermittent fever 1/6-CT chest done 1/7- Empyema w/  LUL/LL consolidation-possible pneumonia. PCCM consulted and IR placed pigtail catheter/chest tube 1/8 S/P pleural lytics multiple times PCCM.  Subjective: Seen and examined this morning Alert awake oriented no chest pain. Overnight afebrile vital stable labs showed stable CBC BMP Overnight sediment billable chest tube in order to flush, creatinine slightly trending up placed on IV fluids overnight Chest x-ray with persistent left base collapse/consolidation > going for CT chest this morning, mild bleeding on the chest tube  Assessment and plan:  Left loculated complex pleural effusion- suspected empyema Left-sided community-acquired pneumonia-recently treated: W/  having intermittent fever. S/p  50 cc thoracentesis done 1/5-cx negative, blood culture NGTD. fluid LDH 639 - is exudative-serum LDH -134 serum protein 6.6 pleural fluid protein 4.7-patient having intermittent fever. 1/7 CT chest:pockets of air noted within no fluid on CT could be due to elevation thoracentesis versus empyema-PCCM consulted  1/8:S/p IR  pigtail drain/chest tube placement - pleural fluid culture NGTD,  S/P lytics 1/8 and 1/9, 1/10 per PCCM.  Patient had increasing oxygen requirement with 2 L and tachypneic with some bright red blood from the chest  tube after fibrinolytics on 1/10> chest tube output up at 580 cc Chest x-ray with persistent left base collapse/consolidation > going for CT chest this morning Continue chest tube management, pulmonary toileting per PCCM. 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  Initially treated left-sided pneumonia-likely the trigger for DKA.A1c 13.2 indicating poor control at baseline. DKA resolved, started on basal bolus insulin  regimen, continue current Lantus  to 24 u, Premeal 6 u tid, ssi 0-9  DM coordinator following-continue insulin  diabetic teaching.  Patient reports she will be able to use insulin  at home. Recent Labs  Lab 09/22/24 1621 09/22/24 1733 09/27/24 0807 09/27/24 1153 09/27/24 1614 09/27/24 2147 09/28/24 0808  GLUCAP  --    < > 175* 270* 254* 221* 186*  HGBA1C 13.2*  --   --   --   --   --   --    < > = values in this interval not displayed.   Hypertension Hyperlipidemia: Cardiomegaly per CT Aortic atherosclerosis per CT: PTA valsartan bedtime, BP now well-controlled after resuming home meds continue valsartan, continue Lipitor. On Repatha  as outpatient.  Chronic anemia on iron supplement: Stable hb. Cont iron supplement  Hypothyroidism: Continue home Synthroid   On chronic oxycodone /Cymbalta  primidone : Continue same  Acute urinary retention/ OAB W/ incontinence at baseline: Foley catheter placed in the ED- removed 1/7- and reinstated given need for multiple in and out overnight on 1/7 Cont Flomax ,Gemtesa  TOV once off chest tube and able to mobilize well  Mild AKI: Continue gentle IV fluid hydration monitor  Class I Obesity w/ Body mass index is 34.16 kg/m.: Will benefit with PCP follow-up,  weight loss,healthy lifestyle and outpatient sleep eval if not done.  Mobility: PT Orders: Active PT Follow up Rec: Skilled Nursing-Short  Term Rehab (<3 Hours/Day)09/26/2024 1354    DVT prophylaxis: enoxaparin  (LOVENOX ) injection 40 mg Start: 09/23/24 1200 SCDs Start: 09/22/24 1444 Code Status:   Code Status: Limited: Do not attempt resuscitation (DNR) -DNR-LIMITED -Do Not Intubate/DNI  Family Communication: plan of care discussed with patient at bedside. Patient status is: Remains hospitalized because of severity of illness Level of care: Stepdown   Dispo: The patient is from: home w/ wife            Anticipated disposition: SNF once stable  Objective: Vitals last 24 hrs: Vitals:   09/28/24 0700 09/28/24 0730 09/28/24 0800 09/28/24 0839  BP: (!) 115/49  (!) 142/57   Pulse: 70 68 65   Resp: 20 (!) 21 17   Temp:    99 F (37.2 C)  TempSrc:    Axillary  SpO2: 95% 95% 95%   Weight:      Height:        Physical Examination: General exam:AAOX3, obese, pleasant HEENT:Oral mucosa moist, Ear/Nose WNL grossly Respiratory system: Bilaterally clear breath sounds, left side diminished, chest tube in place w/ some bleeding Cardiovascular system: S1 & S2 +, No JVD. Gastrointestinal system: Abdomen soft,NT,ND, BS+ Nervous System: Alert, awake, non focal Extremities: extremities warm, leg edema neg Skin: Warm, no rashes MSK: Normal muscle bulk,tone, power  Foley+  Medications reviewed:  Scheduled Meds:  atorvastatin   10 mg Oral QHS   Chlorhexidine  Gluconate Cloth  6 each Topical Daily   DULoxetine   60 mg Oral QHS   enoxaparin  (LOVENOX ) injection  40 mg Subcutaneous Q24H   insulin  aspart  0-5 Units Subcutaneous QHS   insulin  aspart  0-9 Units Subcutaneous TID WC   insulin  aspart  6 Units Subcutaneous TID WC   insulin  glargine  24 Units Subcutaneous Daily   irbesartan   37.5 mg Oral Daily   levothyroxine   137 mcg Oral QAC breakfast   mirabegron  ER  25 mg Oral q1800   pantoprazole   40 mg Oral BID   primidone   250 mg Oral BID   primidone   50 mg Oral Q1200   sodium chloride  flush  10 mL Intrapleural Q8H   sodium  chloride flush  3 mL Intravenous Q12H   tamsulosin   0.8 mg Oral QHS   Continuous Infusions:  sodium chloride  75 mL/hr at 09/28/24 0615   piperacillin -tazobactam (ZOSYN )  IV Stopped (09/28/24 0550)   Diet: Diet Order             Diet Carb Modified Room service appropriate? Yes  Diet effective now                   Data Reviewed: I have personally reviewed following labs and imaging studies ( see epic result tab) CBC: Recent Labs  Lab 09/22/24 1107 09/24/24 0826 09/25/24 0322 09/26/24 0337 09/27/24 0257 09/28/24 0250  WBC 9.7 7.6 6.6 6.5 7.5 8.4  NEUTROABS 8.3*  --   --   --   --   --   HGB 12.8* 11.3* 10.9* 11.2* 10.6* 11.0*  HCT 38.2* 32.4* 31.6* 32.6* 31.4* 32.1*  MCV 98.2 95.3 94.9 94.8 94.0 95.0  PLT 356 328 315 330 350 417*   CMP: Recent Labs  Lab 09/23/24 1543 09/25/24 0322 09/26/24 0337 09/27/24 0257 09/28/24 0250  NA 135 135 135 134* 134*  K 3.8 3.8 3.6 3.5 3.5  CL 104 100  98 97* 99  CO2 21* 23 22 22 23   GLUCOSE 237* 250* 213* 208* 199*  BUN 9 12 11 13 17   CREATININE 0.75 0.83 0.76 0.82 1.23  CALCIUM  9.4 9.1 9.1 9.1 8.9   GFR: Estimated Creatinine Clearance: 62.9 mL/min (by C-G formula based on SCr of 1.23 mg/dL). Recent Labs  Lab 09/22/24 1107 09/24/24 0826  AST 21 93*  ALT 9 43  ALKPHOS 93 165*  BILITOT 0.2 0.3  PROT 7.7 6.6  ALBUMIN 3.3* 2.7*    No results for input(s): LIPASE, AMYLASE in the last 168 hours.  No results for input(s): AMMONIA in the last 168 hours. Coagulation Profile: No results for input(s): INR, PROTIME in the last 168 hours. Unresulted Labs (From admission, onward)     Start     Ordered   09/29/24 0500  Basic metabolic panel with GFR  Daily,   R     Question:  Specimen collection method  Answer:  Lab=Lab collect   09/28/24 0938   09/29/24 0500  CBC  Daily,   R     Question:  Specimen collection method  Answer:  Lab=Lab collect   09/28/24 0938   09/23/24 0902  Expectorated Sputum Assessment w Gram Stain,  Rflx to Resp Cult  Once,   R        09/23/24 0901           Antimicrobials/Microbiology: Anti-infectives (From admission, onward)    Start     Dose/Rate Route Frequency Ordered Stop   09/24/24 1800  Ampicillin-Sulbactam (UNASYN) 3 g in sodium chloride  0.9 % 100 mL IVPB  Status:  Discontinued        3 g 200 mL/hr over 30 Minutes Intravenous Every 6 hours 09/24/24 1645 09/24/24 1703   09/24/24 1800  piperacillin -tazobactam (ZOSYN ) IVPB 3.375 g        3.375 g 12.5 mL/hr over 240 Minutes Intravenous Every 8 hours 09/24/24 1705     09/24/24 1000  azithromycin  (ZITHROMAX ) tablet 500 mg  Status:  Discontinued        500 mg Oral Daily 09/23/24 1249 09/24/24 1645   09/23/24 1000  cefTRIAXone  (ROCEPHIN ) 2 g in sodium chloride  0.9 % 100 mL IVPB  Status:  Discontinued        2 g 200 mL/hr over 30 Minutes Intravenous Every 24 hours 09/23/24 0901 09/24/24 1645   09/23/24 1000  azithromycin  (ZITHROMAX ) 500 mg in sodium chloride  0.9 % 250 mL IVPB  Status:  Discontinued        500 mg 250 mL/hr over 60 Minutes Intravenous Every 24 hours 09/23/24 0901 09/23/24 1249         Component Value Date/Time   SDES  09/25/2024 1338    PLEURAL Performed at [facility], [address] [place], [place] [address]    SPECREQUEST  09/25/2024 1338    NONE Performed at [facility], [address] [place], [place] [address]    CULT  09/25/2024 1338    NO GROWTH 2 DAYS Performed at [facility], [address] [place], [place] [address]    REPTSTATUS PENDING 09/25/2024 1338    Procedures:  Mennie LAMY, MD [facility] 09/28/2024, 9:39 AM   "

## 2024-09-29 ENCOUNTER — Inpatient Hospital Stay (HOSPITAL_COMMUNITY)

## 2024-09-29 DIAGNOSIS — E1110 Type 2 diabetes mellitus with ketoacidosis without coma: Secondary | ICD-10-CM | POA: Diagnosis not present

## 2024-09-29 DIAGNOSIS — J90 Pleural effusion, not elsewhere classified: Secondary | ICD-10-CM | POA: Diagnosis not present

## 2024-09-29 LAB — BODY FLUID CULTURE W GRAM STAIN
Culture: NO GROWTH
Gram Stain: NONE SEEN

## 2024-09-29 LAB — GLUCOSE, CAPILLARY
Glucose-Capillary: 140 mg/dL — ABNORMAL HIGH (ref 70–99)
Glucose-Capillary: 133 mg/dL — ABNORMAL HIGH (ref 70–99)
Glucose-Capillary: 165 mg/dL — ABNORMAL HIGH (ref 70–99)
Glucose-Capillary: 194 mg/dL — ABNORMAL HIGH (ref 70–99)

## 2024-09-29 LAB — BASIC METABOLIC PANEL WITH GFR
Anion gap: 12 (ref 5–15)
BUN: 18 mg/dL (ref 8–23)
CO2: 23 mmol/L (ref 22–32)
Calcium: 8.8 mg/dL — ABNORMAL LOW (ref 8.9–10.3)
Chloride: 100 mmol/L (ref 98–111)
Creatinine, Ser: 1.14 mg/dL (ref 0.61–1.24)
GFR, Estimated: >60 mL/min
Glucose, Bld: 154 mg/dL — ABNORMAL HIGH (ref 70–99)
Potassium: 3.4 mmol/L — ABNORMAL LOW (ref 3.5–5.1)
Sodium: 136 mmol/L (ref 135–145)

## 2024-09-29 LAB — CBC
HCT: 30.8 % — ABNORMAL LOW (ref 39.0–52.0)
Hemoglobin: 10.4 g/dL — ABNORMAL LOW (ref 13.0–17.0)
MCH: 32.4 pg (ref 26.0–34.0)
MCHC: 33.8 g/dL (ref 30.0–36.0)
MCV: 96 fL (ref 80.0–100.0)
Platelets: 448 K/uL — ABNORMAL HIGH (ref 150–400)
RBC: 3.21 MIL/uL — ABNORMAL LOW (ref 4.22–5.81)
RDW: 12.3 % (ref 11.5–15.5)
WBC: 11 K/uL — ABNORMAL HIGH (ref 4.0–10.5)
nRBC: 0 % (ref 0.0–0.2)

## 2024-09-29 MED ORDER — ADULT MULTIVITAMIN W/MINERALS CH
1.0000 | ORAL_TABLET | Freq: Every day | ORAL | Status: DC
  Administered 2024-09-29 – 2024-10-03 (×5): 1 via ORAL
  Filled 2024-09-29 (×5): qty 1

## 2024-09-29 MED ORDER — GLUCERNA SHAKE PO LIQD
237.0000 mL | Freq: Three times a day (TID) | ORAL | Status: DC
  Administered 2024-09-30 – 2024-10-03 (×10): 237 mL via ORAL
  Filled 2024-09-29 (×13): qty 237

## 2024-09-29 NOTE — Progress Notes (Signed)
 Patient ID: Kevin Olson, male   DOB: 12-Mar-1946, 79 y.o.   MRN: [id number] Per order of Dr. Theophilus, pt's left anterior chest drain was removed in its entirety without immediate complications. Vaseline gauze dressing applied to site. EBL none. Follow up CXR pending.

## 2024-09-29 NOTE — Progress Notes (Signed)
 " PROGRESS NOTE Kevin Olson  FMW:[id number] DOB: 02-27-1946 DOA: 09/22/2024 PCP: Nanci Senior, MD  Brief Narrative/Hospital Course: Kevin Olson is a 79 y.o. male with PMH of diabetes, history of prostate cancer, who has been sick for about 2 week, presented to the ED and found to have with DKA and large left pleural effusion and admitted on 1/5 and s/p left thoracentesis 50 cc yellow fluid, complex on ultrasound. Recent ED visit 1/1-diagnosed with pneumonia and discharged, also noted to be hyperglycemic with high anion gap at that time.  CT abdomen pelvis with contrast on 1/1 no acute finding. DKA resolved. Having intermittent fever 1/6-CT chest done 1/7- Empyema w/  LUL/LL consolidation-possible pneumonia. PCCM consulted and IR placed pigtail catheter/chest tube 1/8 S/P pleural lytics multiple times PCCM.  Subjective: Seen and examined  He has been moved to 6 floor he is doing well overall no nausea vomiting chest pain Overnight afebrile BP stable  Assessment and plan:  Left loculated complex pleural effusion- suspected empyema Left-sided community-acquired pneumonia-recently treated: W/  having intermittent fever. S/p  50 cc thoracentesis done 1/5-cx negative, blood culture NGTD. fluid LDH 639 - is exudative-serum LDH -134 serum protein 6.6 pleural fluid protein 4.7-patient having intermittent fever. 1/7 CT chest:pockets of air noted within no fluid on CT could be due to elevation thoracentesis versus empyema 1/8:S/p IR  pigtail drain/chest tube placement - pleural fluid culture NGTD  S/P lytics x 3 ( 1/8,1/9,1/10) with some bright red blood from the chest tube after fibrinolytics on 1/10 Repeat CT chest obtained 1/11 shows catheter in place no pneumothorax small residual left pleural effusion, consolidative changes of the majority of the left lower lobe may represent a compressive atelectasis or pneumonia Continue to manage chest tube as per pulmonary. Continue on Zosyn , pulmonary  toileting   DKA T2DM , non-insulin -dependent, with neuropathy, 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  DKA likely from patient's pneumonia,A1c 13.2 indicating poor control at baseline. DKA resolved, started on basal bolus insulin  regimen will need home insulin . Continue current Lantus   24 u, Premeal 6 u tid, ssi 0-9 - DM coordinator following home insulin  teaching Recent Labs  Lab 09/22/24 1621 09/22/24 1733 09/28/24 0808 09/28/24 1131 09/28/24 1709 09/28/24 2136 09/29/24 0805  GLUCAP  --    < > 186* 263* 133* 200* 140*  HGBA1C 13.2*  --   --   --   --   --   --    < > = values in this interval not displayed.   Hypertension Hyperlipidemia: Cardiomegaly per CT Aortic atherosclerosis per CT: PTA valsartan bedtime, BP now well-controlled continue valsartan, continue Lipitor.On Repatha  as outpatient.  Chronic anemia on iron supplement: Stable hb. Cont iron supplement  Hypothyroidism: Continue home Synthroid   On chronic oxycodone /Cymbalta  primidone : Continue same  Acute urinary retention/ OAB W/ incontinence at baseline: Foley catheter placed in the ED- removed 1/7- and reinstated given need for multiple in and out overnight on 1/7 Cont Flomax ,Gemtesa. TOV once off chest tube and able to mobilize well  Mild AKI: Stable  Class I Obesity w/ Body mass index is 34.16 kg/m.: Will benefit with PCP follow-up, weight loss,healthy lifestyle and outpatient sleep eval if not done.  Mobility: PT Orders: Active PT Follow up Rec: Skilled Nursing-Short Term Rehab (<3 Hours/Day)09/26/2024 1354    DVT prophylaxis: enoxaparin  (LOVENOX ) injection 40 mg Start: 09/23/24 1200 SCDs Start: 09/22/24 1444 Code Status:   Code Status: Limited: Do not attempt resuscitation (DNR) -DNR-LIMITED -Do  Not Intubate/DNI  Family Communication: plan of care discussed with patient at bedside. Patient status is: Remains hospitalized because  of severity of illness Level of care: Med-Surg   Dispo: The patient is from: home w/ wife            Anticipated disposition: SNF once stable and off chest tube.  Objective: Vitals last 24 hrs: Vitals:   09/28/24 1638 09/28/24 2133 09/29/24 0033 09/29/24 0501  BP: (!) 128/58 (!) 114/55 123/60 130/62  Pulse: 68 68 69 66  Resp: 16 14 14 15   Temp: 97.7 F (36.5 C) (!) 97.5 F (36.4 C) 97.8 F (36.6 C) (!) 97.4 F (36.3 C)  TempSrc: Oral Oral Oral Oral  SpO2: 96% 94% 95% 95%  Weight:      Height:        Physical Examination: General exam:AAOX3 pleasant not in distress HEENT:Oral mucosa moist, Ear/Nose WNL grossly Respiratory system: Left-sided chest tube in place clear air entry  Cardiovascular system: S1 & S2 +, No JVD. Gastrointestinal system: Abdomen soft,NT,ND, BS+ Nervous System: Alert, awake, non focal Extremities: extremities warm, leg edema neg Skin: Warm, no rashes MSK: Normal muscle bulk,tone, power  Foley+  Medications reviewed:  Scheduled Meds:  atorvastatin   10 mg Oral QHS   Chlorhexidine  Gluconate Cloth  6 each Topical Daily   DULoxetine   60 mg Oral QHS   enoxaparin  (LOVENOX ) injection  40 mg Subcutaneous Q24H   insulin  aspart  0-5 Units Subcutaneous QHS   insulin  aspart  0-9 Units Subcutaneous TID WC   insulin  aspart  6 Units Subcutaneous TID WC   insulin  glargine  24 Units Subcutaneous Daily   irbesartan   37.5 mg Oral Daily   levothyroxine   137 mcg Oral QAC breakfast   mirabegron  ER  25 mg Oral q1800   pantoprazole   40 mg Oral BID   primidone   250 mg Oral BID   primidone   50 mg Oral Q1200   sodium chloride  flush  10 mL Intrapleural Q8H   sodium chloride  flush  3 mL Intravenous Q12H   tamsulosin   0.8 mg Oral QHS   Continuous Infusions:  piperacillin -tazobactam (ZOSYN )  IV 3.375 g (09/29/24 0858)   Diet: Diet Order             Diet Carb Modified Room service appropriate? Yes  Diet effective now                   Data Reviewed: I have  personally reviewed following labs and imaging studies ( see epic result tab) CBC: Recent Labs  Lab 09/25/24 0322 09/26/24 0337 09/27/24 0257 09/28/24 0250 09/29/24 0647  WBC 6.6 6.5 7.5 8.4 11.0*  HGB 10.9* 11.2* 10.6* 11.0* 10.4*  HCT 31.6* 32.6* 31.4* 32.1* 30.8*  MCV 94.9 94.8 94.0 95.0 96.0  PLT 315 330 350 417* 448*   CMP: Recent Labs  Lab 09/25/24 0322 09/26/24 0337 09/27/24 0257 09/28/24 0250 09/29/24 0647  NA 135 135 134* 134* 136  K 3.8 3.6 3.5 3.5 3.4*  CL 100 98 97* 99 100  CO2 23 22 22 23 23   GLUCOSE 250* 213* 208* 199* 154*  BUN 12 11 13 17 18   CREATININE 0.83 0.76 0.82 1.23 1.14  CALCIUM  9.1 9.1 9.1 8.9 8.8*   GFR: Estimated Creatinine Clearance: 67.8 mL/min (by C-G formula based on SCr of 1.14 mg/dL). Recent Labs  Lab 09/24/24 0826  AST 93*  ALT 43  ALKPHOS 165*  BILITOT 0.3  PROT 6.6  ALBUMIN 2.7*  No results for input(s): LIPASE, AMYLASE in the last 168 hours.  No results for input(s): AMMONIA in the last 168 hours. Coagulation Profile: No results for input(s): INR, PROTIME in the last 168 hours. Unresulted Labs (From admission, onward)     Start     Ordered   09/29/24 0500  Basic metabolic panel with GFR  Daily,   R     Question:  Specimen collection method  Answer:  Lab=Lab collect   09/28/24 0938   09/29/24 0500  CBC  Daily,   R     Question:  Specimen collection method  Answer:  Lab=Lab collect   09/28/24 0938   09/23/24 0902  Expectorated Sputum Assessment w Gram Stain, Rflx to Resp Cult  Once,   R        09/23/24 0901           Antimicrobials/Microbiology: Anti-infectives (From admission, onward)    Start     Dose/Rate Route Frequency Ordered Stop   09/24/24 1800  Ampicillin-Sulbactam (UNASYN) 3 g in sodium chloride  0.9 % 100 mL IVPB  Status:  Discontinued        3 g 200 mL/hr over 30 Minutes Intravenous Every 6 hours 09/24/24 1645 09/24/24 1703   09/24/24 1800  piperacillin -tazobactam (ZOSYN ) IVPB 3.375 g         3.375 g 12.5 mL/hr over 240 Minutes Intravenous Every 8 hours 09/24/24 1705     09/24/24 1000  azithromycin  (ZITHROMAX ) tablet 500 mg  Status:  Discontinued        500 mg Oral Daily 09/23/24 1249 09/24/24 1645   09/23/24 1000  cefTRIAXone  (ROCEPHIN ) 2 g in sodium chloride  0.9 % 100 mL IVPB  Status:  Discontinued        2 g 200 mL/hr over 30 Minutes Intravenous Every 24 hours 09/23/24 0901 09/24/24 1645   09/23/24 1000  azithromycin  (ZITHROMAX ) 500 mg in sodium chloride  0.9 % 250 mL IVPB  Status:  Discontinued        500 mg 250 mL/hr over 60 Minutes Intravenous Every 24 hours 09/23/24 0901 09/23/24 1249         Component Value Date/Time   SDES  09/25/2024 1338    PLEURAL Performed at [facility], [address] [place], [place] [address]    SPECREQUEST  09/25/2024 1338    NONE Performed at [facility], [address], [place], [place] [address]    CULT  09/25/2024 1338    NO GROWTH 3 DAYS Performed at [facility], [address] [place], [place] [address]    REPTSTATUS PENDING 09/25/2024 1338    Procedures:  Mennie LAMY, MD [facility] 09/29/2024, 11:24 AM   "

## 2024-09-29 NOTE — Progress Notes (Signed)
 Nutrition Follow-up  DOCUMENTATION CODES:   Obesity unspecified  INTERVENTION:   -Glucerna Shake po TID, each supplement provides 220 kcal and 10 grams of protein   -Multivitamin with minerals daily  -New weight for admission  NUTRITION DIAGNOSIS:   Food and nutrition related knowledge deficit related to limited prior education as evidenced by per patient/family report.  Addressed on 1/6.  GOAL:   Patient will meet greater than or equal to 90% of their needs  Progressing.  MONITOR:   PO intake, Diet advancement, Labs, Weight trends  ASSESSMENT:   79 y.o. man PMH diabetes, history of prostate cancer, who has been sick for about 2 weeks now, presented with DKA and large left pleural effusion.  1/5: admitted 1/8: chest tube placed  Patient currently with poor and no PO documentation. Pt with no supplementation ordered so will add Glucerna shakes for additional kcals and protein given acute illness.  Having CT of chest today.  Admission weight: 238 lbs Needs updated weight for admission.  Medications: insulin    Labs reviewed: CBGs: 133-140 Low potassium   Diet Order:   Diet Order             Diet Carb Modified Room service appropriate? Yes  Diet effective now                   EDUCATION NEEDS:   Education needs have been addressed  Skin:  Skin Assessment: Reviewed RN Assessment  Last BM:  1/10  Height:   Ht Readings from Last 1 Encounters:  09/22/24 5' 10 (1.778 m)    Weight:   Wt Readings from Last 1 Encounters:  09/22/24 108 kg    BMI:  Body mass index is 34.16 kg/m.  Estimated Nutritional Needs:   Kcal:  1800-2000 kcals  Protein:  85-95 grams  Fluid:  >/= 1.8L  Morna Lee, MS, RD, LDN Inpatient Clinical Dietitian Contact via Secure chat

## 2024-09-29 NOTE — Plan of Care (Signed)
" °  Problem: Nutrition: Goal: Adequate nutrition will be maintained Outcome: Progressing   Problem: Coping: Goal: Level of anxiety will decrease Outcome: Progressing   Problem: Pain Managment: Goal: General experience of comfort will improve and/or be controlled Outcome: Progressing   Problem: Safety: Goal: Ability to remain free from injury will improve Outcome: Progressing   Problem: Skin Integrity: Goal: Risk for impaired skin integrity will decrease Outcome: Progressing   Problem: Clinical Measurements: Goal: Ability to maintain a body temperature in the normal range will improve Outcome: Progressing   "

## 2024-09-29 NOTE — Progress Notes (Addendum)
 "  NAME:  Kevin Olson, MRN:  [id number], DOB:  30-Apr-1946, LOS: 7 ADMISSION DATE:  09/22/2024, CONSULTATION DATE:  1/7 REFERRING MD:  1/7, CHIEF COMPLAINT:  L empyema   History of Present Illness:  Patient is a 79 yo M  presents to [facility] ED on 1/5 w/ Left pleural effusion and DKA. He is on Januvia and Actos , HbA1c 13.2.  He had urinary retention and Foley catheter was placed. He underwent thoracentesis on 1/5, only 50 cc of yellow fluid was removed . Pleural fluid showed total protein 4.7, LDH 639, 260 cells 92% neutrophils .  Cultures negative He had low-grade fever 100.5 today, CT chest was obtained which showed moderate loculated left pleural effusion with gas collections within the pleural fluid and consolidation of left upper and lower lobes. Hence PCCM consulted.  Patient reports dry cough and left-sided chest pain   Pertinent  Medical History   DMT2, prostate cancer, HTN, HLD  Past Medical History:  Diagnosis Date   Depression    DM2 (diabetes mellitus, type 2) (HCC)    Dyslipidemia    Family history of brain cancer    Family history of pancreatic cancer    Hypertension    OA (osteoarthritis) of knee    Prostate cancer (HCC)    prostate     Significant Hospital Events: Including procedures, antibiotic start and stop dates in addition to other pertinent events   1/5 admit w/ L pleural effusion; only 50 mL of fluid removed 1/7 pccm consulted for empyema; no good pockets appreciated on US  1/8 IR to place CT guided Chest tube; given tube lytics w/ 350 outpt from chest tube 1/9-second dose of pleural lytics given 1/10 third dose lytics given.  Chest tube output is now bloody.  Interim History / Subjective:   24-hour chest tube output is 80 cc No new complaints  Objective    Blood pressure 130/62, pulse 66, temperature (!) 97.4 F (36.3 C), temperature source Oral, resp. rate 15, height 5' 10 (1.778 m), weight 108 kg, SpO2 95%.        Intake/Output Summary (Last 24  hours) at 09/29/2024 1306 Last data filed at 09/29/2024 0856 Gross per 24 hour  Intake 673.75 ml  Output 1180 ml  Net -506.25 ml   Filed Weights   09/22/24 1031  Weight: 108 kg    Examination: Somnolent, arousable Lungs are clear to auscultation Heart rate regular rate and rhythm  CT chest reviewed with overall decrease in size of effusion.  Small residual left effusion.  Consolidative changes in the lower lobe.  Resolved problem list   Assessment and Plan   Left loculated complex pleural effusion -pockets of air noted within no fluid on CT could be result of recent thoracentesis versus empyema.  He had low-grade fever, no leukocytosis Plan: Given 3 doses of lytics.  Further doses of lytics held as chest tube output is bloody CT chest reviewed with improved effusion with minimal residual fluid Chest tube output has dropped off and can be removed today by IR Continue Zosyn .  Will recommend total 14 days of antibiotic therapy.  Can switch to Augmentin  if he is getting discharged.  PCCM will be available as needed.  Please call with questions.  Darlene Brozowski MD [facility] See Amion for pager  If no response to pager , please call [phone] until 7pm After 7:00 pm call Elink  [phone] 09/29/2024, 1:06 PM         "

## 2024-09-30 DIAGNOSIS — E1110 Type 2 diabetes mellitus with ketoacidosis without coma: Secondary | ICD-10-CM | POA: Diagnosis not present

## 2024-09-30 LAB — CBC
HCT: 31.3 % — ABNORMAL LOW (ref 39.0–52.0)
Hemoglobin: 10.5 g/dL — ABNORMAL LOW (ref 13.0–17.0)
MCH: 32.2 pg (ref 26.0–34.0)
MCHC: 33.5 g/dL (ref 30.0–36.0)
MCV: 96 fL (ref 80.0–100.0)
Platelets: 557 K/uL — ABNORMAL HIGH (ref 150–400)
RBC: 3.26 MIL/uL — ABNORMAL LOW (ref 4.22–5.81)
RDW: 12.1 % (ref 11.5–15.5)
WBC: 12.6 K/uL — ABNORMAL HIGH (ref 4.0–10.5)
nRBC: 0 % (ref 0.0–0.2)

## 2024-09-30 LAB — BASIC METABOLIC PANEL WITH GFR
Anion gap: 12 (ref 5–15)
BUN: 14 mg/dL (ref 8–23)
CO2: 24 mmol/L (ref 22–32)
Calcium: 9 mg/dL (ref 8.9–10.3)
Chloride: 100 mmol/L (ref 98–111)
Creatinine, Ser: 0.88 mg/dL (ref 0.61–1.24)
GFR, Estimated: >60 mL/min
Glucose, Bld: 147 mg/dL — ABNORMAL HIGH (ref 70–99)
Potassium: 3.4 mmol/L — ABNORMAL LOW (ref 3.5–5.1)
Sodium: 137 mmol/L (ref 135–145)

## 2024-09-30 LAB — GLUCOSE, CAPILLARY
Glucose-Capillary: 136 mg/dL — ABNORMAL HIGH (ref 70–99)
Glucose-Capillary: 153 mg/dL — ABNORMAL HIGH (ref 70–99)
Glucose-Capillary: 192 mg/dL — ABNORMAL HIGH (ref 70–99)
Glucose-Capillary: 252 mg/dL — ABNORMAL HIGH (ref 70–99)

## 2024-09-30 MED ORDER — POTASSIUM CHLORIDE CRYS ER 20 MEQ PO TBCR
40.0000 meq | EXTENDED_RELEASE_TABLET | Freq: Once | ORAL | Status: AC
  Administered 2024-09-30: 40 meq via ORAL
  Filled 2024-09-30: qty 2

## 2024-09-30 NOTE — Progress Notes (Signed)
 Physical Therapy Treatment Patient Details Name: Kevin Olson MRN: [id number] DOB: [birth year] Today's Date: 09/30/2024   History of Present Illness Kevin Olson is a 79 y.o. male , presented to the ED 09/22/24  with DKA and large left pleural effusion , s/p left thoracentesis, L chest tube placed 1/8.SABRA PMH of diabetes, history of prostate cancer,    PT Comments  PT - Cognition Comments: AxO x 2.5 improving alertness.  Aware he is in the hospital.  Unaware how many days or day of week.  Following all commands with increased time.  Slightly foggy recall past few days.  Found incont loose BM in bed.  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  Married lives in [place].  General bed mobility comments: Side to side rolling for peri care loose BM then assist with sitting EOB at [facility] Assist for upper body due to ABD girth and Mod Assist for B LE due to weakness.  Max Assist to complete scooting to EOB.  Initial posterior lean.  Once upright and midline, Pt was able to static sit at Supervision EOB x 5 min to adapt.  Trial RA was 92% and avg HR 77.  General transfer comment: First, assisted from elevated bed to 1/4 turn BSC NO AD such that Pt could steady self with B UE's.  Sat on BSC x 7 min to continue BM.  Assisted with sit to stand + 2 assist and peri care.  Pt able to static stand 60 seconds at Min Assist.  Reported to NT to have + 2 assist back to bed.  General Gait Details: Limited amb distance 12 feet + 2 Mod Assist such that recliner was closely following behind.  Distance limited by weakness/fatigue/effort.  Posture is poor with forward flexed trunk and B flexed knees.  Steps are short and low floor clearance.  Trial RA sats 96% and HR increased to 87.  Requesting need to sit.  Then c/o dizziness.  BP 138/82.  Returned to room in recliner.  Reapplied 2 lts.    Prior to admit, Pt was IND amb NO AD in house and walker sometimes community.  LPT has rec Pt will need ST Rehab at SNF  to address mobility and functional decline prior to safely returning home.    If plan is discharge home, recommend the following: Two people to help with walking and/or transfers;A lot of help with bathing/dressing/bathroom   Can travel by private vehicle     No  Equipment Recommendations  None recommended by PT    Recommendations for Other Services       Precautions / Restrictions Precautions Precautions: Fall Precaution/Restrictions Comments: monitor sats Restrictions Weight Bearing Restrictions Per Provider Order: No     Mobility  Bed Mobility Overal bed mobility: Needs Assistance Bed Mobility: Supine to Sit, Rolling Rolling: Mod assist, +2 for physical assistance, +2 for safety/equipment   Supine to sit: Mod assist, +2 for physical assistance, +2 for safety/equipment     General bed mobility comments: Side to side rolling for peri care loose BM then assist with sitting EOB at [facility] Assist for upper body due to ABD girth and Mod Assist for B LE due to weakness.  Max Assist to complete scooting to EOB.  Initial posterior lean.  Once upright and midline, Pt was able to static sit at Supervision EOB x 5 min to adapt.  Trial RA was 92% and avg HR 77.    Transfers Overall transfer level: Needs assistance Equipment used: Rolling walker (  2 wheels), None Transfers: Sit to/from Stand, Bed to chair/wheelchair/BSC Sit to Stand: +2 physical assistance, +2 safety/equipment, Mod assist, Min assist Stand pivot transfers: Mod assist, +2 physical assistance, +2 safety/equipment         General transfer comment: First, assisted from elevated bed to 1/4 turn BSC NO AD such that Pt could steady self with B UE's.  Sat on BSC x 7 min to continue BM.  Assisted with sit to stand + 2 assist and peri care.  Pt able to static stand 60 seconds at Min Assist.  Reported to NT to have + 2 assist back to bed.    Ambulation/Gait Ambulation/Gait assistance: Mod assist, +2 physical assistance, +2  safety/equipment Gait Distance (Feet): 12 Feet Assistive device: Rolling walker (2 wheels) Gait Pattern/deviations: Step-to pattern, Decreased step length - left, Decreased step length - right, Trunk flexed, Knee flexed in stance - left, Knee flexed in stance - right Gait velocity: decreased     General Gait Details: Limited amb distance 12 feet + 2 Mod Assist such that recliner was closely following behind.  Distance limited by weakness/fatigue/effort.  Posture is poor with forward flexed trunk and B flexed knees.  Steps are short and low floor clearance.  Trial RA sats 96% and HR increased to 87.  Requesting need to sit.  Then c/o dizziness.  BP 138/82.  Returned to room in recliner.   Stairs             Wheelchair Mobility     Tilt Bed    Modified Rankin (Stroke Patients Only)       Balance                                            Communication    Cognition Arousal: Alert Behavior During Therapy: WFL for tasks assessed/performed, Flat affect   PT - Cognitive impairments: Memory, Problem solving, Safety/Judgement                       PT - Cognition Comments: AxO x 2.5 improving alertness.  Aware he is in the hospital.  Unaware how many days or day of week.  Following all commands with increased time.  Slightly foggy recall past few days.  Found incont loose BM in bed.  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        Cueing    Exercises      General Comments        Pertinent Vitals/Pain Pain Assessment Pain Assessment: No/denies pain    Home Living                          Prior Function            PT Goals (current goals can now be found in the care plan section) Progress towards PT goals: Progressing toward goals    Frequency    Min 2X/week      PT Plan      Co-evaluation              AM-PAC PT 6 Clicks Mobility   Outcome Measure  Help  needed turning from your back to your side while in a flat bed without using bedrails?: A Lot Help needed moving from lying on your back to sitting  on the side of a flat bed without using bedrails?: A Lot Help needed moving to and from a bed to a chair (including a wheelchair)?: A Lot Help needed standing up from a chair using your arms (e.g., wheelchair or bedside chair)?: A Lot Help needed to walk in hospital room?: A Lot Help needed climbing 3-5 steps with a railing? : Total 6 Click Score: 11    End of Session Equipment Utilized During Treatment: Gait belt Activity Tolerance: Patient limited by fatigue;Patient tolerated treatment well Patient left: in chair;with call bell/phone within reach;with chair alarm set;with family/visitor present Nurse Communication: Mobility status PT Visit Diagnosis: Unsteadiness on feet (R26.81)     Time: 8497-8465 PT Time Calculation (min) (ACUTE ONLY): 32 min  Charges:    $Gait Training: 8-22 mins $Therapeutic Activity: 8-22 mins PT General Charges $$ ACUTE PT VISIT: 1 Visit                     {Elazar Argabright  PTA Acute  Rehabilitation Services Office M-F          [phone]

## 2024-09-30 NOTE — TOC Progression Note (Addendum)
 Transition of Care [facility]) - Progression Note    Patient Details  Name: Kevin Olson MRN: [id number] Date of Birth: 1945-11-04  [facility]) CM/SW Contact  Toy LITTIE Agar, RN Phone Number:[phone]  09/30/2024, 2:59 PM  Clinical Narrative:    Cm at bedside introduced self and explained role. Patient is agreeable to SNF workup and confirms that his wife will help make decision once we have bed offers. CM to initiates SNF workup.   FL2 completed and faxed out for bed offers. PASRR # [id number].   Expected Discharge Plan:  (TBD awaiting PT/OT recommendations) Barriers to Discharge: Continued Medical Work up               Expected Discharge Plan and Services In-house Referral: NA Discharge Planning Services: CM Consult Post Acute Care Choice: Durable Medical Equipment Living arrangements for the past 2 months: Single Family Home                                       Social Drivers of Health (SDOH) Interventions SDOH Screenings   Food Insecurity: No Food Insecurity (09/22/2024)  Housing: Low Risk (09/22/2024)  Transportation Needs: No Transportation Needs (09/22/2024)  Utilities: Not At Risk (09/22/2024)  Depression (PHQ2-9): Low Risk (06/02/2024)  Social Connections: Patient Declined (09/22/2024)  Tobacco Use: Medium Risk (09/22/2024)    Readmission Risk Interventions    09/25/2024    4:37 PM  Readmission Risk Prevention Plan  Transportation Screening Complete  PCP or Specialist Appt within 5-7 Days Complete  Home Care Screening Complete  Medication Review (RN CM) Complete

## 2024-09-30 NOTE — Progress Notes (Signed)
 " PROGRESS NOTE Kevin Olson  FMW:[id number] DOB: 11-05-1945 DOA: 09/22/2024 PCP: Nanci Senior, MD  Brief Narrative/Hospital Course: Kevin Olson is a 79 y.o. male with PMH of diabetes, history of prostate cancer, who has been sick for about 2 week, presented to the ED and found to have with DKA and large left pleural effusion and admitted on 1/5 and s/p left thoracentesis 50 cc yellow fluid, complex on ultrasound. Recent ED visit 1/1-diagnosed with pneumonia and discharged, also noted to be hyperglycemic with high anion gap at that time.  CT abdomen pelvis with contrast on 1/1 no acute finding. DKA resolved. Having intermittent fever 1/6-CT chest done 1/7- Empyema w/  LUL/LL consolidation-possible pneumonia. PCCM consulted and IR placed pigtail catheter/chest tube 1/8 S/P pleural lytics multiple times PCCM. Chest tube was not removed 1/12-repeat chest x-ray shows no pneumothorax and stable small left pleural effusion low lung volume  Subjective: Seen and examined  Resting comfortably Overnight remains afebrile BP stable labs show mild leukocytosis BMP SHOWS MILD HYPOKALEMIA  Assessment and plan:  Left loculated complex pleural effusion- suspected empyema Left-sided community-acquired pneumonia-recently treated: W/  having intermittent fever. S/p  50 cc thoracentesis done 1/5-cx negative, blood culture NGTD. fluid LDH 639 - is exudative-serum LDH -134 serum protein 6.6 pleural fluid protein 4.7-patient having intermittent fever. 1/7 CT chest:pockets of air noted within no fluid on CT could be due to elevation thoracentesis versus empyema 1/8:S/p IR  pigtail drain/chest tube placement - pleural fluid culture NGTD  S/P lytics x 3 ( 1/8,1/9,1/10) with some bright red blood from the chest tube after fibrinolytics on 1/10 Repeat CT chest obtained 1/11 shows catheter in place no pneumothorax small residual left pleural effusion, consolidative changes of the majority of the left lower lobe may  represent a compressive atelectasis or pneumonia Chest tube was not removed 1/12-repeat chest x-ray shows no pneumothorax and stable small left pleural effusion low lung volume Being managed with Zosyn  >>> for D/C switch to Augmentin  to complete 14 days of antibiotic therapy   DKA T2DM , non-insulin -dependent, with neuropathy, 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  DKA likely from patient's pneumonia,A1c 13.2 indicating poor control at baseline. DKA resolved, started on basal bolus insulin  regimen will need home insulin .  RN to teach injection. Per patient and his wife able to do insulin  inj Continue current Lantus   24 u, Premeal 6 u tid, ssi 0-9 - DM coordinator following home insulin  teaching Recent Labs  Lab 09/29/24 1206 09/29/24 1709 09/29/24 2151 09/30/24 0751 09/30/24 1134  GLUCAP 133* 165* 194* 136* 153*   Hypertension Hyperlipidemia: Cardiomegaly per CT Aortic atherosclerosis per CT: PTA valsartan bedtime, BP now well-controlled continue valsartan, continue Lipitor.On Repatha  as outpatient.  Chronic anemia on iron supplement: Stable hb. 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  Mobility: PT Orders: Active PT Follow up Rec: Skilled Nursing-Short Term Rehab (<3 Hours/Day)09/26/2024 1354    DVT prophylaxis: enoxaparin  (LOVENOX ) injection 40 mg Start: 09/23/24 1200 SCDs Start: 09/22/24 1444 Code Status:   Code Status:  Limited: Do not attempt resuscitation (DNR) -DNR-LIMITED -Do Not Intubate/DNI  Family Communication: plan of care discussed with patient at bedside. Patient status is: Remains hospitalized because of severity of illness Level of care: Med-Surg   Dispo: The patient is from: home w/ wife            Anticipated disposition: SNF once stable and off chest tube.  Objective: Vitals last 24 hrs: Vitals:   09/29/24 0501 09/29/24 1330 09/29/24 2127 09/30/24 0346  BP: 130/62 (!) 121/52 136/61 138/74  Pulse: 66 66 72 73  Resp: 15 16 18 18   Temp: (!) 97.4 F (36.3 C) (!) 97.5 F (36.4 C) 97.8 F (36.6 C) 97.7 F (36.5 C)  TempSrc: Oral Oral Oral Oral  SpO2: 95% 97% 96% 96%  Weight:      Height:        Physical Examination: General exam:AAOX3 HEENT:Oral mucosa moist, Ear/Nose WNL grossly Respiratory system: Bilaterally clear breath sounds  Cardiovascular system: S1 & S2 +, No JVD. Gastrointestinal system: Abdomen soft,NT,ND, BS+ Nervous System: Alert, awake, non focal Extremities: extremities warm, leg edema neg Skin: Warm, no rashes MSK: Normal muscle bulk,tone, power   Medications reviewed:  Scheduled Meds:  atorvastatin   10 mg Oral QHS   Chlorhexidine  Gluconate Cloth  6 each Topical Daily   DULoxetine   60 mg Oral QHS   enoxaparin  (LOVENOX ) injection  40 mg Subcutaneous Q24H   feeding supplement (GLUCERNA SHAKE)  237 mL Oral TID BM   insulin  aspart  0-5 Units Subcutaneous QHS   insulin  aspart  0-9 Units Subcutaneous TID WC   insulin  aspart  6 Units Subcutaneous TID WC   insulin  glargine  24 Units Subcutaneous Daily   irbesartan   37.5 mg Oral Daily   levothyroxine   137 mcg Oral QAC breakfast   mirabegron  ER  25 mg Oral q1800   multivitamin with minerals  1 tablet Oral Daily   pantoprazole   40 mg Oral BID   primidone   250 mg Oral BID   primidone   50 mg Oral Q1200   sodium chloride  flush  3 mL Intravenous Q12H   tamsulosin   0.8 mg Oral QHS   Continuous Infusions:   piperacillin -tazobactam (ZOSYN )  IV 3.375 g (09/30/24 1032)   Diet: Diet Order             Diet Carb Modified Room service appropriate? Yes  Diet effective now                   Data Reviewed: I have personally reviewed following labs and imaging studies ( see epic result tab) CBC: Recent Labs  Lab 09/26/24 0337 09/27/24 0257 09/28/24 0250 09/29/24 0647 09/30/24 0750  WBC 6.5 7.5 8.4 11.0* 12.6*  HGB 11.2* 10.6* 11.0* 10.4* 10.5*  HCT 32.6* 31.4* 32.1* 30.8* 31.3*  MCV 94.8 94.0 95.0 96.0 96.0  PLT 330 350 417* 448* 557*   CMP: Recent Labs  Lab 09/26/24 0337 09/27/24 0257 09/28/24 0250 09/29/24 0647 09/30/24 0750  NA 135 134* 134* 136 137  K 3.6 3.5 3.5 3.4* 3.4*  CL 98 97* 99 100 100  CO2 22 22 23 23 24   GLUCOSE 213* 208* 199* 154* 147*  BUN 11 13 17 18 14   CREATININE 0.76 0.82 1.23 1.14 0.88  CALCIUM  9.1 9.1 8.9 8.8* 9.0   GFR: Estimated Creatinine Clearance: 87.9 mL/min (by C-G formula based on SCr of 0.88 mg/dL). Recent Labs  Lab 09/24/24 0826  AST 93*  ALT 43  ALKPHOS 165*  BILITOT 0.3  PROT 6.6  ALBUMIN 2.7*    No results for input(s): LIPASE, AMYLASE in the last 168 hours.  No results for input(s): AMMONIA in the last 168 hours. Coagulation Profile: No results for input(s): INR, PROTIME in the last 168 hours. Unresulted Labs (From admission, onward)    None      Antimicrobials/Microbiology: Anti-infectives (From admission, onward)    Start     Dose/Rate Route Frequency Ordered Stop   09/24/24 1800  Ampicillin-Sulbactam (UNASYN) 3 g in sodium chloride  0.9 % 100 mL IVPB  Status:  Discontinued        3 g 200 mL/hr over 30 Minutes Intravenous Every 6 hours 09/24/24 1645 09/24/24 1703   09/24/24 1800  piperacillin -tazobactam (ZOSYN ) IVPB 3.375 g        3.375 g 12.5 mL/hr over 240 Minutes Intravenous Every 8 hours 09/24/24 1705     09/24/24 1000  azithromycin  (ZITHROMAX ) tablet 500 mg  Status:  Discontinued        500 mg Oral Daily  09/23/24 1249 09/24/24 1645   09/23/24 1000  cefTRIAXone  (ROCEPHIN ) 2 g in sodium chloride  0.9 % 100 mL IVPB  Status:  Discontinued        2 g 200 mL/hr over 30 Minutes Intravenous Every 24 hours 09/23/24 0901 09/24/24 1645   09/23/24 1000  azithromycin  (ZITHROMAX ) 500 mg in sodium chloride  0.9 % 250 mL IVPB  Status:  Discontinued        500 mg 250 mL/hr over 60 Minutes Intravenous Every 24 hours 09/23/24 0901 09/23/24 1249         Component Value Date/Time   SDES  09/25/2024 1338    PLEURAL Performed at [facility], [address], [place], [place] [address]    SPECREQUEST  09/25/2024 1338    NONE Performed at [facility], [address], [place], [place] [address]    CULT  09/25/2024 1338    NO GROWTH 3 DAYS Performed at [facility], [address] [place], [place] [address]    REPTSTATUS 09/29/2024 FINAL 09/25/2024 1338    Procedures:  Mennie LAMY, MD [facility] 09/30/2024, 12:44 PM   "

## 2024-09-30 NOTE — Plan of Care (Signed)
" °  Problem: Skin Integrity: Goal: Risk for impaired skin integrity will decrease Outcome: Progressing   Problem: Tissue Perfusion: Goal: Adequacy of tissue perfusion will improve Outcome: Progressing   Problem: Cardiac: Goal: Ability to maintain an adequate cardiac output will improve Outcome: Progressing   Problem: Metabolic: Goal: Ability to maintain appropriate glucose levels will improve Outcome: Progressing   Problem: Nutritional: Goal: Maintenance of adequate nutrition will improve Outcome: Progressing   Problem: Respiratory: Goal: Will regain and/or maintain adequate ventilation Outcome: Progressing   Problem: Urinary Elimination: Goal: Ability to achieve and maintain adequate renal perfusion and functioning will improve Outcome: Progressing   Problem: Clinical Measurements: Goal: Ability to maintain clinical measurements within normal limits will improve Outcome: Progressing Goal: Will remain free from infection Outcome: Progressing   Problem: Nutrition: Goal: Adequate nutrition will be maintained Outcome: Progressing   Problem: Coping: Goal: Level of anxiety will decrease Outcome: Progressing   Problem: Elimination: Goal: Will not experience complications related to bowel motility Outcome: Progressing   Problem: Pain Managment: Goal: General experience of comfort will improve and/or be controlled Outcome: Progressing   Problem: Safety: Goal: Ability to remain free from injury will improve Outcome: Progressing   "

## 2024-09-30 NOTE — NC FL2 (Signed)
 " [place]  MEDICAID FL2 LEVEL OF CARE FORM     IDENTIFICATION  Patient Name: Kevin Olson Birthdate: 1945-12-09 Sex: male Admission Date (Current Location): 09/22/2024  [facility] and [place] Number:  Producer, Television/film/video and Address:  [facility],  [address], [place] [address]      Provider Number: [id number]  Attending Physician Name and Address:  Christobal Guadalajara, MD  Relative Name and Phone Number:  Laquan Beier [phone]    Current Level of Care: Hospital Recommended Level of Care: Skilled Nursing Facility Prior Approval Number:    Date Approved/Denied:   PASRR Number: [id number]  Discharge Plan: SNF    Current Diagnoses: Patient Active Problem List   Diagnosis Date Noted   Pleural effusion 09/25/2024   Empyema (HCC) 09/24/2024   Acute urinary retention 09/23/2024   Pleural effusion on left 09/23/2024   Acute metabolic encephalopathy 09/23/2024   DKA (diabetic ketoacidosis) (HCC) 09/22/2024   Genetic testing 05/16/2022   Family history of pancreatic cancer 05/03/2022   Family history of brain cancer 05/03/2022   IDA (iron deficiency anemia) 03/18/2022   Anxiety 03/18/2022   Diabetic peripheral neuropathy (HCC) 03/18/2022   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   Essential tremor 03/18/2022   Hypothyroidism 03/18/2022   Mixed hyperlipidemia 03/18/2022   History of colonic polyps 03/18/2022   Polyneuropathy 03/18/2022   Osteoarthritis 03/18/2022   Diabetes mellitus type 2, noninsulin dependent (HCC) 03/18/2022   Morbid obesity (HCC) 03/18/2022   Symptomatic anemia 03/17/2022   S/P total knee arthroplasty, right 12/21/2020   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/26/2020   Malignant neoplasm of prostate (HCC) 07/22/2019   Osteoarthritis of left knee 10/02/2018   Osteoarthritis of right knee 10/02/2018   Bilateral knee pain 09/03/2018   Low back pain 12/17/2017   Spinal stenosis of lumbar region 12/17/2017    Orientation  RESPIRATION BLADDER Height & Weight     Self, Place, Situation  Normal Indwelling catheter Weight: 108 kg Height:  5' 10 (177.8 cm)  BEHAVIORAL SYMPTOMS/MOOD NEUROLOGICAL BOWEL NUTRITION STATUS     (n/a) Incontinent Diet (Carb modified)  AMBULATORY STATUS COMMUNICATION OF NEEDS Skin   Total Care Verbally Other (Comment) (abrasion /redness noted to bilateral lower legs)                       Personal Care Assistance Level of Assistance  Bathing, Feeding, Dressing, Total care Bathing Assistance: Maximum assistance Feeding assistance: Limited assistance Dressing Assistance: Maximum assistance Total Care Assistance: Maximum assistance   Functional Limitations Info  Sight, Hearing, Speech Sight Info: Impaired Hearing Info: Adequate Speech Info: Adequate    SPECIAL CARE FACTORS FREQUENCY  PT (By licensed PT), OT (By licensed OT)     PT Frequency: 5x/wk OT Frequency: 5x/wk            Contractures Contractures Info: Not present    Additional Factors Info  Code Status, Allergies, Psychotropic, Insulin  Sliding Scale, Isolation Precautions, Suctioning Needs Code Status Info: DNR Allergies Info: Metformin and related Psychotropic Info: see d/c summary Insulin  Sliding Scale Info: see d/c summary Isolation Precautions Info: n/a Suctioning Needs: n/a   Current Medications (09/30/2024):  This is the current hospital active medication list Current Facility-Administered Medications  Medication Dose Route Frequency Provider Last Rate Last Admin   acetaminophen  (TYLENOL ) tablet 650 mg  650 mg Oral Q6H PRN Jadine Toribio SQUIBB, MD   650 mg at 09/28/24 0153   Or   acetaminophen  (TYLENOL ) suppository 650  mg  650 mg Rectal Q6H PRN Jadine Toribio SQUIBB, MD       atorvastatin  (LIPITOR) tablet 10 mg  10 mg Oral QHS Christobal Guadalajara, MD   10 mg at 09/29/24 2153   Chlorhexidine  Gluconate Cloth 2 % PADS 6 each  6 each Topical Daily Jadine Toribio SQUIBB, MD   6 each at 09/30/24 1033   dextrose  50 %  solution 0-50 mL  0-50 mL Intravenous PRN Jadine Toribio SQUIBB, MD       DULoxetine  (CYMBALTA ) DR capsule 60 mg  60 mg Oral QHS Jadine Toribio SQUIBB, MD   60 mg at 09/29/24 2153   enoxaparin  (LOVENOX ) injection 40 mg  40 mg Subcutaneous Q24H Jadine Toribio SQUIBB, MD   40 mg at 09/30/24 1213   feeding supplement (GLUCERNA SHAKE) (GLUCERNA SHAKE) liquid 237 mL  237 mL Oral TID BM Kc, Ramesh, MD   237 mL at 09/30/24 1332   hydrALAZINE  (APRESOLINE ) injection 5 mg  5 mg Intravenous Q6H PRN Chavez, Abigail, NP   5 mg at 09/26/24 2307   insulin  aspart (novoLOG ) injection 0-5 Units  0-5 Units Subcutaneous QHS Kc, Guadalajara, MD   2 Units at 09/27/24 2238   insulin  aspart (novoLOG ) injection 0-9 Units  0-9 Units Subcutaneous TID WC Kc, Guadalajara, MD   2 Units at 09/30/24 1214   insulin  aspart (novoLOG ) injection 6 Units  6 Units Subcutaneous TID WC Kc, Guadalajara, MD   6 Units at 09/30/24 1214   insulin  glargine (LANTUS ) injection 24 Units  24 Units Subcutaneous Daily Kc, Guadalajara, MD   24 Units at 09/30/24 1021   irbesartan  (AVAPRO ) tablet 37.5 mg  37.5 mg Oral Daily Kc, Ramesh, MD   37.5 mg at 09/30/24 1019   levothyroxine  (SYNTHROID ) tablet 137 mcg  137 mcg Oral QAC breakfast Jadine Toribio SQUIBB, MD   137 mcg at 09/30/24 9471   mirabegron  ER (MYRBETRIQ ) tablet 25 mg  25 mg Oral q1800 Jadine Toribio SQUIBB, MD   25 mg at 09/29/24 1727   multivitamin with minerals tablet 1 tablet  1 tablet Oral Daily Kc, Guadalajara, MD   1 tablet at 09/30/24 1019   ondansetron  (ZOFRAN ) tablet 4 mg  4 mg Oral Q6H PRN Jadine Toribio SQUIBB, MD       Or   ondansetron  (ZOFRAN ) injection 4 mg  4 mg Intravenous Q6H PRN Jadine Toribio SQUIBB, MD       Oral care mouth rinse  15 mL Mouth Rinse PRN Jadine Toribio SQUIBB, MD       oxyCODONE  (Oxy IR/ROXICODONE ) immediate release tablet 5 mg  5 mg Oral Q6H PRN Jadine Toribio SQUIBB, MD   5 mg at 09/27/24 1540   pantoprazole  (PROTONIX ) EC tablet 40 mg  40 mg Oral BID Kc, Guadalajara, MD   40 mg at 09/30/24 1021    piperacillin -tazobactam (ZOSYN ) IVPB 3.375 g  3.375 g Intravenous Q8H Nicholaus Quarry, RPH 12.5 mL/hr at 09/30/24 1032 3.375 g at 09/30/24 1032   pneumococcal 20-valent conjugate vaccine (PREVNAR 20) injection 0.5 mL  0.5 mL Intramuscular Once PRN Kc, Guadalajara, MD       polyethylene glycol (MIRALAX  / GLYCOLAX ) packet 17 g  17 g Oral Daily PRN Jadine Toribio SQUIBB, MD       primidone  (MYSOLINE ) tablet 250 mg  250 mg Oral BID Jadine Toribio SQUIBB, MD   250 mg at 09/30/24 1019   primidone  (MYSOLINE ) tablet 50 mg  50 mg Oral Q1200 Jadine Toribio SQUIBB, MD   50  mg at 09/30/24 1213   sodium chloride  flush (NS) 0.9 % injection 3 mL  3 mL Intravenous Q12H Jadine Toribio SQUIBB, MD   3 mL at 09/30/24 1000   tamsulosin  (FLOMAX ) capsule 0.8 mg  0.8 mg Oral QHS Christobal Guadalajara, MD   0.8 mg at 09/29/24 2153     Discharge Medications: Please see discharge summary for a list of discharge medications.  Relevant Imaging Results:  Relevant Lab Results:   Additional Information SS# [id number]  Toy LITTIE Agar, RN     "

## 2024-10-01 DIAGNOSIS — E1110 Type 2 diabetes mellitus with ketoacidosis without coma: Secondary | ICD-10-CM | POA: Diagnosis not present

## 2024-10-01 LAB — BASIC METABOLIC PANEL WITH GFR
Anion gap: 11 (ref 5–15)
BUN: 13 mg/dL (ref 8–23)
CO2: 27 mmol/L (ref 22–32)
Calcium: 9.1 mg/dL (ref 8.9–10.3)
Chloride: 100 mmol/L (ref 98–111)
Creatinine, Ser: 0.94 mg/dL (ref 0.61–1.24)
GFR, Estimated: >60 mL/min
Glucose, Bld: 316 mg/dL — ABNORMAL HIGH (ref 70–99)
Potassium: 3.3 mmol/L — ABNORMAL LOW (ref 3.5–5.1)
Sodium: 137 mmol/L (ref 135–145)

## 2024-10-01 LAB — CBC
HCT: 33.6 % — ABNORMAL LOW (ref 39.0–52.0)
Hemoglobin: 11.5 g/dL — ABNORMAL LOW (ref 13.0–17.0)
MCH: 33 pg (ref 26.0–34.0)
MCHC: 34.2 g/dL (ref 30.0–36.0)
MCV: 96.6 fL (ref 80.0–100.0)
Platelets: 649 K/uL — ABNORMAL HIGH (ref 150–400)
RBC: 3.48 MIL/uL — ABNORMAL LOW (ref 4.22–5.81)
RDW: 12.3 % (ref 11.5–15.5)
WBC: 10.1 K/uL (ref 4.0–10.5)
nRBC: 0 % (ref 0.0–0.2)

## 2024-10-01 LAB — GLUCOSE, CAPILLARY
Glucose-Capillary: 163 mg/dL — ABNORMAL HIGH (ref 70–99)
Glucose-Capillary: 233 mg/dL — ABNORMAL HIGH (ref 70–99)
Glucose-Capillary: 267 mg/dL — ABNORMAL HIGH (ref 70–99)
Glucose-Capillary: 230 mg/dL — ABNORMAL HIGH (ref 70–99)

## 2024-10-01 MED ORDER — POTASSIUM CHLORIDE CRYS ER 20 MEQ PO TBCR
40.0000 meq | EXTENDED_RELEASE_TABLET | ORAL | Status: AC
  Administered 2024-10-01 (×2): 40 meq via ORAL
  Filled 2024-10-01 (×2): qty 2

## 2024-10-01 NOTE — TOC Progression Note (Addendum)
 Transition of Care [facility]) - Progression Note    Patient Details  Name: Kevin Olson MRN: [id number] Date of Birth: 29-May-1946  [facility]) CM/SW Contact  Doneta Glenys DASEN, RN Phone Number: 10/01/2024, 12:15 PM  Clinical Narrative:    CM sent time explained the acceptance of SNF. They (pt and wife) really wanted [facility] (close to home). CM reached out to [facility] since they had not reviewed referral in the HUB. No beds availability CM expanded the SNF search. [facility] accepted. Insurance auth started.   Expected Discharge Plan:  (TBD awaiting PT/OT recommendations) Barriers to Discharge: Continued Medical Work up               Expected Discharge Plan and Services In-house Referral: NA Discharge Planning Services: CM Consult Post Acute Care Choice: Durable Medical Equipment Living arrangements for the past 2 months: Single Family Home                                       Social Drivers of Health (SDOH) Interventions SDOH Screenings   Food Insecurity: No Food Insecurity (09/22/2024)  Housing: Low Risk (09/22/2024)  Transportation Needs: No Transportation Needs (09/22/2024)  Utilities: Not At Risk (09/22/2024)  Depression (PHQ2-9): Low Risk (06/02/2024)  Social Connections: Patient Declined (09/22/2024)  Tobacco Use: Medium Risk (09/22/2024)    Readmission Risk Interventions    09/25/2024    4:37 PM  Readmission Risk Prevention Plan  Transportation Screening Complete  PCP or Specialist Appt within 5-7 Days Complete  Home Care Screening Complete  Medication Review (RN CM) Complete

## 2024-10-01 NOTE — Plan of Care (Signed)
" °  Problem: Coping: Goal: Level of anxiety will decrease Outcome: Progressing   Problem: Pain Managment: Goal: General experience of comfort will improve and/or be controlled Outcome: Progressing   Problem: Safety: Goal: Ability to remain free from injury will improve Outcome: Progressing   Problem: Skin Integrity: Goal: Risk for impaired skin integrity will decrease Outcome: Progressing   Problem: Clinical Measurements: Goal: Ability to maintain a body temperature in the normal range will improve Outcome: Progressing   Problem: Respiratory: Goal: Ability to maintain adequate ventilation will improve Outcome: Progressing Goal: Ability to maintain a clear airway will improve Outcome: Progressing   "

## 2024-10-01 NOTE — Progress Notes (Addendum)
 " [facility] Progress Note  Patient: [place]     FMW:[id number]  DOA: 09/22/2024   PCP: Nanci Senior, MD       Brief hospital course: [place] is a 79 y.o. male with PMH of diabetes, history of prostate cancer, who has been sick for about 2 week, presented to the ED and found to have with DKA and large left pleural effusion and admitted on 1/5 and s/p left thoracentesis 50 cc yellow fluid, complex on ultrasound. Recent ED visit 1/1-diagnosed with pneumonia and discharged, also noted to be hyperglycemic with high anion gap at that time.  CT abdomen pelvis with contrast on 1/1 no acute finding. DKA resolved. Having intermittent fever 1/6-CT chest done 1/7- Empyema w/  LUL/LL consolidation-possible pneumonia. PCCM consulted and IR placed pigtail catheter/chest tube 1/8 S/P pleural lytics multiple times PCCM. Chest tube was not removed 1/12-repeat chest x-ray shows no pneumothorax and stable small left pleural effusion low lung volume    Subjective:  He has no complaints  Assessment and Plan: Principal Problem:   DKA (diabetic ketoacidosis), diabetes mellitus type 2 Diabetic peripheral neuropathy - A1c 13.2 - DKA likely exacerbated by underlying pneumonia - Continue Lantus  and NovoLog   Active Problems:  Pneumonia, community-acquired, left-sided with loculated pleural effusion suspected to be empyema - Zosyn  to be transitioned to Augmentin  to complete 14 days  Acute metabolic encephalopathy - Secondary to above  Hypokalemia - Continue to replace and follow  Acute urinary retention - Voiding without Foley catheter - Continue Flomax  and mirabegron   Hypertension, cardiomegaly on CT - Continue ARB nd as needed hydralazine   Thrombocytosis - Cause uncertain-follow     Morbid obesity class 2 Body mass index is 34.16 kg/m.   Disposition: Waiting on SNF    Code Status: Limited: Do not attempt resuscitation (DNR) -DNR-LIMITED -Do Not Intubate/DNI  Total  time on patient care: 35 minutes DVT prophylaxis:  enoxaparin  (LOVENOX ) injection 40 mg Start: 09/23/24 1200 SCDs Start: 09/22/24 1444     Objective:   Vitals:   09/30/24 1934 10/01/24 0548 10/01/24 1035 10/01/24 1325  BP: (!) 127/53 (!) 155/66 125/60 135/60  Pulse: 79 64  76  Resp: 18 18  16   Temp: 97.6 F (36.4 C) 97.7 F (36.5 C)  98.1 F (36.7 C)  TempSrc: Oral Oral  Oral  SpO2: 98% 95%  93%  Weight:      Height:       Filed Weights   09/22/24 1031  Weight: 108 kg   Exam: General exam: Appears comfortable  HEENT: oral mucosa moist Respiratory system: Clear to auscultation.  Cardiovascular system: S1 & S2 heard  Gastrointestinal system: Abdomen soft, non-tender, nondistended. Normal bowel sounds   Extremities: No cyanosis, clubbing or edema Psychiatry:  Mood & affect appropriate.      CBC: Recent Labs  Lab 09/27/24 0257 09/28/24 0250 09/29/24 0647 09/30/24 0750 10/01/24 1316  WBC 7.5 8.4 11.0* 12.6* 10.1  HGB 10.6* 11.0* 10.4* 10.5* 11.5*  HCT 31.4* 32.1* 30.8* 31.3* 33.6*  MCV 94.0 95.0 96.0 96.0 96.6  PLT 350 417* 448* 557* 649*   Basic Metabolic Panel: Recent Labs  Lab 09/27/24 0257 09/28/24 0250 09/29/24 0647 09/30/24 0750 10/01/24 1316  NA 134* 134* 136 137 137  K 3.5 3.5 3.4* 3.4* 3.3*  CL 97* 99 100 100 100  CO2 22 23 23 24 27   GLUCOSE 208* 199* 154* 147* 316*  BUN 13 17 18 14 13   CREATININE 0.82 1.23 1.14 0.88 0.94  CALCIUM  9.1 8.9 8.8* 9.0 9.1     Scheduled Meds:  atorvastatin   10 mg Oral QHS   Chlorhexidine  Gluconate Cloth  6 each Topical Daily   DULoxetine   60 mg Oral QHS   enoxaparin  (LOVENOX ) injection  40 mg Subcutaneous Q24H   feeding supplement (GLUCERNA SHAKE)  237 mL Oral TID BM   insulin  aspart  0-5 Units Subcutaneous QHS   insulin  aspart  0-9 Units Subcutaneous TID WC   insulin  aspart  6 Units Subcutaneous TID WC   insulin  glargine  24 Units Subcutaneous Daily   irbesartan   37.5 mg Oral Daily   levothyroxine   137  mcg Oral QAC breakfast   mirabegron  ER  25 mg Oral q1800   multivitamin with minerals  1 tablet Oral Daily   pantoprazole   40 mg Oral BID   primidone   250 mg Oral BID   primidone   50 mg Oral Q1200   sodium chloride  flush  3 mL Intravenous Q12H   tamsulosin   0.8 mg Oral QHS    Imaging and lab data personally reviewed   Author: Gearl Kimbrough  10/01/2024 7:05 PM  To contact [facility]>   Check the care team in [facility] and look for the attending/consulting [facility] provider listed  Log into [email or web address] and use [place]'s universal password   Go to> [facility]  and find provider  If you still have difficulty reaching the provider, please page the [facility] (Director on Call) for the Hospitalists listed on amion     "

## 2024-10-01 NOTE — Plan of Care (Signed)
  Problem: Coping: Goal: Ability to adjust to condition or change in health will improve Outcome: Progressing   Problem: Nutritional: Goal: Maintenance of adequate nutrition will improve Outcome: Progressing   Problem: Skin Integrity: Goal: Risk for impaired skin integrity will decrease Outcome: Progressing   Problem: Activity: Goal: Risk for activity intolerance will decrease Outcome: Progressing   Problem: Nutrition: Goal: Adequate nutrition will be maintained Outcome: Progressing

## 2024-10-01 NOTE — Progress Notes (Signed)
 Physical Therapy Treatment Patient Details Name: [place] MRN: [id number] DOB: 1946/01/28 Today's Date: 10/01/2024   History of Present Illness [place] is a 79 y.o. male , presented to the ED 09/22/24  with DKA and large left pleural effusion , s/p left thoracentesis, L chest tube placed 1/8.SABRA PMH of diabetes, history of prostate cancer,    PT Comments  Multiple days of loose, pudding thick, uncontrolled stools.  Reported to Nursing.  AxO x 2.5 improving alertness.  Aware he is in the hospital.  Unaware how many days or day of week.  Following all commands with increased time.  Slightly foggy recall past few days.  Found incont loose BM in bed. Multiple days of loose stools. Spouse present.   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  Married lives in [place].  Assisted OOB was difficult.  General bed mobility comments: Required Mod/Max Assist for upper body due to weakness and ABD girth.  Required Max Assist to complete scooting to EOB. General transfer comment: First, assisted from elevated bed to 1/4 turn BSC NO AD such that Pt could steady self with B UE's.  Sat on BSC x 7 min to continue BM.  Assisted with sit to stand + 2 assist and peri care.  Pt able to static stand 60 seconds at [employer]. Unsteady.  Weak.  General Gait Details: Limited amb distance 11 feet (twice) + 2 Mod Assist such that recliner was closely following behind.  Distance limited by weakness/fatigue/effort.  Posture is poor with forward flexed trunk and B flexed knees.  Steps are short and low floor clearance.  Trial RA sats 96% and HR increased to 87.  Requesting need to sit.   Returned to room in recliner.  Max c/o fatigue.  Prior to admit, Pt was IND and amb without any AD.  LPT has rec Pt will need ST Rehab at SNF to address mobility and functional decline prior to safely returning home.    If plan is discharge home, recommend the following: Two people to help with walking and/or  transfers;A lot of help with bathing/dressing/bathroom   Can travel by private vehicle     No  Equipment Recommendations  None recommended by PT    Recommendations for Other Services       Precautions / Restrictions Precautions Precautions: Fall Precaution/Restrictions Comments: monitor sats/days of loose stools Restrictions Weight Bearing Restrictions Per Provider Order: No     Mobility  Bed Mobility Overal bed mobility: Needs Assistance Bed Mobility: Supine to Sit, Rolling Rolling: Mod assist, +2 for physical assistance, +2 for safety/equipment   Supine to sit: Mod assist, +2 for physical assistance, +2 for safety/equipment     General bed mobility comments: Required Mod/Max Assist for upper body due to weakness and ABD girth.  Required Max Assist to complete scooting to EOB.    Transfers Overall transfer level: Needs assistance Equipment used: Rolling walker (2 wheels), None Transfers: Sit to/from Stand, Bed to chair/wheelchair/BSC Sit to Stand: +2 physical assistance, +2 safety/equipment, Mod assist, Min assist Stand pivot transfers: Mod assist, +2 physical assistance, +2 safety/equipment Step pivot transfers: Max assist, +2 physical assistance, +2 safety/equipment       General transfer comment: First, assisted from elevated bed to 1/4 turn BSC NO AD such that Pt could steady self with B UE's.  Sat on BSC x 7 min to continue BM.  Assisted with sit to stand + 2 assist and peri care.  Pt able to static stand 60  seconds at [employer].  Reported to NT to have + 2 assist back to bed.    Ambulation/Gait Ambulation/Gait assistance: Mod assist, +2 physical assistance, +2 safety/equipment Gait Distance (Feet): 22 Feet Assistive device: Rolling walker (2 wheels) Gait Pattern/deviations: Step-to pattern, Decreased step length - left, Decreased step length - right, Trunk flexed, Knee flexed in stance - left, Knee flexed in stance - right Gait velocity: decreased      General Gait Details: Limited amb distance 11 feet (twice) + 2 Mod Assist such that recliner was closely following behind.  Distance limited by weakness/fatigue/effort.  Posture is poor with forward flexed trunk and B flexed knees.  Steps are short and low floor clearance.  Trial RA sats 96% and HR increased to 87.  Requesting need to sit.   Returned to room in recliner.  Max c/o fatigue.   Stairs             Wheelchair Mobility     Tilt Bed    Modified Rankin (Stroke Patients Only)       Balance                                            Communication    Cognition Arousal: Alert     PT - Cognitive impairments: Memory, Problem solving, Safety/Judgement                       PT - Cognition Comments: AxO x 2.5 improving alertness.  Aware he is in the hospital.  Unaware how many days or day of week.  Following all commands with increased time.  Slightly foggy recall past few days.  Found incont loose BM in bed. Multiple days of loose stools. Spouse present.   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        Cueing    Exercises      General Comments        Pertinent Vitals/Pain Pain Assessment Pain Assessment: No/denies pain    Home Living                          Prior Function            PT Goals (current goals can now be found in the care plan section) Progress towards PT goals: Progressing toward goals    Frequency    Min 2X/week      PT Plan      Co-evaluation              AM-PAC PT 6 Clicks Mobility   Outcome Measure  Help needed turning from your back to your side while in a flat bed without using bedrails?: A Lot Help needed moving from lying on your back to sitting on the side of a flat bed without using bedrails?: A Lot Help needed moving to and from a bed to a chair (including a wheelchair)?: A Lot Help needed standing up from a chair using  your arms (e.g., wheelchair or bedside chair)?: A Lot Help needed to walk in hospital room?: A Lot Help needed climbing 3-5 steps with a railing? : Total 6 Click Score: 11    End of Session Equipment Utilized During Treatment: Gait belt Activity Tolerance: Patient limited by fatigue;Patient tolerated treatment well Patient left: in  chair;with call bell/phone within reach;with chair alarm set;with family/visitor present Nurse Communication: Mobility status PT Visit Diagnosis: Unsteadiness on feet (R26.81)     Time: 8897-8871 PT Time Calculation (min) (ACUTE ONLY): 26 min  Charges:    $Gait Training: 8-22 mins $Therapeutic Exercise: 8-22 mins PT General Charges $$ ACUTE PT VISIT: 1 Visit                    Katheryn Leap  PTA Acute  Rehabilitation Services Office M-F          [phone]

## 2024-10-02 DIAGNOSIS — E1110 Type 2 diabetes mellitus with ketoacidosis without coma: Secondary | ICD-10-CM | POA: Diagnosis not present

## 2024-10-02 LAB — GLUCOSE, CAPILLARY
Glucose-Capillary: 156 mg/dL — ABNORMAL HIGH (ref 70–99)
Glucose-Capillary: 192 mg/dL — ABNORMAL HIGH (ref 70–99)
Glucose-Capillary: 225 mg/dL — ABNORMAL HIGH (ref 70–99)
Glucose-Capillary: 206 mg/dL — ABNORMAL HIGH (ref 70–99)

## 2024-10-02 LAB — BASIC METABOLIC PANEL WITH GFR
Anion gap: 7 (ref 5–15)
BUN: 11 mg/dL (ref 8–23)
CO2: 26 mmol/L (ref 22–32)
Calcium: 9 mg/dL (ref 8.9–10.3)
Chloride: 106 mmol/L (ref 98–111)
Creatinine, Ser: 0.84 mg/dL (ref 0.61–1.24)
GFR, Estimated: >60 mL/min
Glucose, Bld: 165 mg/dL — ABNORMAL HIGH (ref 70–99)
Potassium: 3.8 mmol/L (ref 3.5–5.1)
Sodium: 139 mmol/L (ref 135–145)

## 2024-10-02 LAB — CBC
HCT: 30.6 % — ABNORMAL LOW (ref 39.0–52.0)
Hemoglobin: 10.4 g/dL — ABNORMAL LOW (ref 13.0–17.0)
MCH: 32.5 pg (ref 26.0–34.0)
MCHC: 34 g/dL (ref 30.0–36.0)
MCV: 95.6 fL (ref 80.0–100.0)
Platelets: 629 K/uL — ABNORMAL HIGH (ref 150–400)
RBC: 3.2 MIL/uL — ABNORMAL LOW (ref 4.22–5.81)
RDW: 12.1 % (ref 11.5–15.5)
WBC: 8.2 K/uL (ref 4.0–10.5)
nRBC: 0 % (ref 0.0–0.2)

## 2024-10-02 LAB — MAGNESIUM: Magnesium: 2.2 mg/dL (ref 1.7–2.4)

## 2024-10-02 MED ORDER — POTASSIUM CHLORIDE CRYS ER 20 MEQ PO TBCR: 40.0000 meq | EXTENDED_RELEASE_TABLET | ORAL | Status: DC

## 2024-10-02 MED ORDER — AMOXICILLIN-POT CLAVULANATE 875-125 MG PO TABS
1.0000 | ORAL_TABLET | Freq: Two times a day (BID) | ORAL | Status: DC
  Administered 2024-10-02 – 2024-10-03 (×2): 1 via ORAL
  Filled 2024-10-02 (×2): qty 1

## 2024-10-02 NOTE — Progress Notes (Signed)
 " [facility] Progress Note  Patient: [place]     FMW:[id number]  DOA: 09/22/2024   PCP: Nanci Senior, MD       Brief hospital course: Xayvier Vallez is a 79 y.o. male with PMH of diabetes, history of prostate cancer, who has been sick for about 2 week, presented to the ED and found to have with DKA and large left pleural effusion and admitted on 1/5 and s/p left thoracentesis 50 cc yellow fluid, complex on ultrasound. Recent ED visit 1/1-diagnosed with pneumonia and discharged, also noted to be hyperglycemic with high anion gap at that time.  CT abdomen pelvis with contrast on 1/1 no acute finding. DKA resolved. Having intermittent fever 1/6-CT chest done 1/7- Empyema w/  LUL/LL consolidation-possible pneumonia. PCCM consulted and IR placed pigtail catheter/chest tube 1/8 S/P pleural lytics multiple times PCCM. Chest tube was not removed 1/12-repeat chest x-ray shows no pneumothorax and stable small left pleural effusion low lung volume    Subjective:  No complaints.   Assessment and Plan: Principal Problem:   DKA (diabetic ketoacidosis), diabetes mellitus type 2 Diabetic peripheral neuropathy - A1c 13.2 - DKA likely exacerbated by underlying pneumonia - Continue Lantus  and NovoLog   Active Problems:  Pneumonia, community-acquired, left-sided with loculated pleural effusion suspected to be empyema - Zosyn  to be transitioned to Augmentin  to complete 14 days  Acute metabolic encephalopathy - Secondary to above  Hypokalemia - Continue to replace and follow  Acute urinary retention - Voiding without Foley catheter - Continue Flomax  and mirabegron   Hypertension, cardiomegaly on CT - Continue ARB nd as needed hydralazine   Thrombocytosis - Cause uncertain-follow     Morbid obesity class 2 Body mass index is 34.16 kg/m.   Disposition: Waiting on SNF    Code Status: Limited: Do not attempt resuscitation (DNR) -DNR-LIMITED -Do Not Intubate/DNI  Total time on  patient care: 35 minutes DVT prophylaxis:  enoxaparin  (LOVENOX ) injection 40 mg Start: 09/23/24 1200 SCDs Start: 09/22/24 1444     Objective:   Vitals:   10/01/24 1325 10/01/24 2043 10/02/24 0458 10/02/24 1018  BP: 135/60 (!) 130/54 (!) 147/68 (!) 132/56  Pulse: 76 65 63   Resp: 16 18 18    Temp: 98.1 F (36.7 C) 99 F (37.2 C) 97.6 F (36.4 C)   TempSrc: Oral Oral Oral   SpO2: 93% 97% 94%   Weight:      Height:       Filed Weights   09/22/24 1031  Weight: 108 kg   Exam: General exam: Appears comfortable  HEENT: oral mucosa moist Respiratory system: Clear to auscultation.  Cardiovascular system: S1 & S2 heard  Gastrointestinal system: Abdomen soft, non-tender, nondistended. Normal bowel sounds   Extremities: No cyanosis, clubbing or edema Psychiatry:  Mood & affect appropriate.      CBC: Recent Labs  Lab 09/28/24 0250 09/29/24 0647 09/30/24 0750 10/01/24 1316 10/02/24 0754  WBC 8.4 11.0* 12.6* 10.1 8.2  HGB 11.0* 10.4* 10.5* 11.5* 10.4*  HCT 32.1* 30.8* 31.3* 33.6* 30.6*  MCV 95.0 96.0 96.0 96.6 95.6  PLT 417* 448* 557* 649* 629*   Basic Metabolic Panel: Recent Labs  Lab 09/28/24 0250 09/29/24 0647 09/30/24 0750 10/01/24 1316 10/02/24 0754  NA 134* 136 137 137 139  K 3.5 3.4* 3.4* 3.3* 3.8  CL 99 100 100 100 106  CO2 23 23 24 27 26   GLUCOSE 199* 154* 147* 316* 165*  BUN 17 18 14 13 11   CREATININE 1.23 1.14 0.88 0.94 0.84  CALCIUM  8.9 8.8* 9.0 9.1 9.0  MG  --   --   --   --  2.2     Scheduled Meds:  amoxicillin -clavulanate  1 tablet Oral Q12H   atorvastatin   10 mg Oral QHS   Chlorhexidine  Gluconate Cloth  6 each Topical Daily   DULoxetine   60 mg Oral QHS   enoxaparin  (LOVENOX ) injection  40 mg Subcutaneous Q24H   feeding supplement (GLUCERNA SHAKE)  237 mL Oral TID BM   insulin  aspart  0-5 Units Subcutaneous QHS   insulin  aspart  0-9 Units Subcutaneous TID WC   insulin  aspart  6 Units Subcutaneous TID WC   insulin  glargine  24 Units  Subcutaneous Daily   irbesartan   37.5 mg Oral Daily   levothyroxine   137 mcg Oral QAC breakfast   mirabegron  ER  25 mg Oral q1800   multivitamin with minerals  1 tablet Oral Daily   pantoprazole   40 mg Oral BID   primidone   250 mg Oral BID   primidone   50 mg Oral Q1200   sodium chloride  flush  3 mL Intravenous Q12H   tamsulosin   0.8 mg Oral QHS    Imaging and lab data personally reviewed   Author: Isaac Lacson  10/02/2024 2:13 PM  To contact [facility]>   Check the care team in [facility] and look for the attending/consulting [facility] provider listed  Log into [email or web address] and use [place]'s universal password   Go to> [facility]  and find provider  If you still have difficulty reaching the provider, please page the [facility] (Director on Call) for the Hospitalists listed on amion     "

## 2024-10-02 NOTE — Progress Notes (Signed)
 Occupational Therapy Treatment Patient Details Name: [place] MRN: [id number] DOB: [birth year] Today's Date: 10/02/2024   History of present illness [place] is a 79 yr old male who presented to the ED 09/22/24  with DKA and large left pleural effusion , s/p left thoracentesis, L chest tube placed 1/8; chest tube now removed. PMH of diabetes, history of prostate cancer,   OT comments  The pt was seen for ADL instruction, functional strengthening and progression of functional activity. He required assist for peri-hygiene at bed level, as well as to perform supine to sit and a step-pivot transfer to the chair using a RW; he required moderate assist overall for functional transfers. Once seated in the chair, he required set-up assist/supervision for upper body grooming. Continue OT plan of care to address his functional limitations and decreased independence with self care tasks. Patient will benefit from continued inpatient follow up therapy, <3 hours/day.       If plan is discharge home, recommend the following:  A lot of help with bathing/dressing/bathroom;Assistance with cooking/housework;Direct supervision/assist for medications management;Assist for transportation;Help with stairs or ramp for entrance;A lot of help with walking and/or transfers   Equipment Recommendations  None recommended by OT    Recommendations for Other Services      Precautions / Restrictions Precautions Precautions: Fall Restrictions Weight Bearing Restrictions Per Provider Order: No       Mobility Bed Mobility Overal bed mobility: Needs Assistance Bed Mobility: Supine to Sit Rolling: Mod assist              Transfers Overall transfer level: Needs assistance Equipment used: Rolling walker (2 wheels) Transfers: Sit to/from Stand, Bed to chair/wheelchair/BSC Sit to Stand: Mod assist, From elevated surface     Step pivot transfers: Mod assist     General transfer comment: Pt required  cues for proper hand placement and BLE positioning on the floor. Once in standing, he required min cues for walker placement and to reach back for chair rail prior to descent into chair     Balance     Sitting balance-Leahy Scale: Fair       Standing balance-Leahy Scale: Poor             ADL either performed or assessed with clinical judgement   ADL Overall ADL's : Needs assistance/impaired     Grooming: Set up;Supervision/safety Grooming Details (indicate cue type and reason): The pt performed face washing and teeth brushing in supported sitting at chair level.         Upper Body Dressing : Minimal assistance;Bed level Upper Body Dressing Details (indicate cue type and reason): The pt required assist to donn a hospital gown in bed.         Toileting- Clothing Manipulation and Hygiene: Maximal assistance;Bed level Toileting - Clothing Manipulation Details (indicate cue type and reason): The pt was noted to be soiled of bowel in bed upon this OT entering his room. He subsequently required moderate assistance to roll left and right, in order to hygiene management to be performed.              Cognition Arousal: Alert Behavior During Therapy: WFL for tasks assessed/performed, Flat affect           Attention impairment (select first level of impairment): Divided attention        Following commands: Impaired Following commands impaired: Follows one step commands with increased time      Cueing   Cueing Techniques: Verbal cues, Tactile  cues             Pertinent Vitals/ Pain       Pain Assessment Pain Assessment: No/denies pain   Frequency  Min 2X/week        Progress Toward Goals  OT Goals(current goals can now be found in the care plan section)     Acute Rehab OT Goals Patient Stated Goal: to get stronger OT Goal Formulation: With patient/family Time For Goal Achievement: 10/10/24 Potential to Achieve Goals: Good  Plan          AM-PAC OT 6 Clicks Daily Activity     Outcome Measure   Help from another person eating meals?: A Little Help from another person taking care of personal grooming?: A Little Help from another person toileting, which includes using toliet, bedpan, or urinal?: A Lot Help from another person bathing (including washing, rinsing, drying)?: A Lot Help from another person to put on and taking off regular upper body clothing?: A Little Help from another person to put on and taking off regular lower body clothing?: A Lot 6 Click Score: 15    End of Session Equipment Utilized During Treatment: Gait belt;Rolling walker (2 wheels)  OT Visit Diagnosis: Unsteadiness on feet (R26.81);Other abnormalities of gait and mobility (R26.89);Muscle weakness (generalized) (M62.81)   Activity Tolerance Patient tolerated treatment well   Patient Left in chair;with call bell/phone within reach;with chair alarm set;with family/visitor present   Nurse Communication Mobility status        Time: 1451-1520 OT Time Calculation (min): 29 min  Charges: OT General Charges $OT Visit: 1 Visit OT Treatments $Self Care/Home Management : 8-22 mins $Therapeutic Activity: 8-22 mins    Delanna JINNY Lesches, OTR/L 10/02/2024, 5:28 PM

## 2024-10-02 NOTE — Plan of Care (Signed)
  Problem: Skin Integrity: Goal: Risk for impaired skin integrity will decrease Outcome: Progressing   Problem: Activity: Goal: Risk for activity intolerance will decrease Outcome: Progressing   Problem: Pain Managment: Goal: General experience of comfort will improve and/or be controlled Outcome: Progressing   Problem: Skin Integrity: Goal: Risk for impaired skin integrity will decrease Outcome: Progressing

## 2024-10-02 NOTE — Plan of Care (Signed)
  Problem: Coping: Goal: Ability to adjust to condition or change in health will improve Outcome: Progressing   Problem: Skin Integrity: Goal: Risk for impaired skin integrity will decrease Outcome: Progressing   Problem: Nutritional: Goal: Maintenance of adequate nutrition will improve Outcome: Progressing

## 2024-10-03 DIAGNOSIS — E1110 Type 2 diabetes mellitus with ketoacidosis without coma: Secondary | ICD-10-CM | POA: Diagnosis not present

## 2024-10-03 DIAGNOSIS — J189 Pneumonia, unspecified organism: Secondary | ICD-10-CM

## 2024-10-03 LAB — GLUCOSE, CAPILLARY
Glucose-Capillary: 143 mg/dL — ABNORMAL HIGH (ref 70–99)
Glucose-Capillary: 191 mg/dL — ABNORMAL HIGH (ref 70–99)
Glucose-Capillary: 194 mg/dL — ABNORMAL HIGH (ref 70–99)
Glucose-Capillary: 144 mg/dL — ABNORMAL HIGH (ref 70–99)

## 2024-10-03 MED ORDER — AMOXICILLIN-POT CLAVULANATE 875-125 MG PO TABS: 1.0000 | ORAL_TABLET | Freq: Two times a day (BID) | ORAL | Status: AC

## 2024-10-03 MED ORDER — GLUCERNA SHAKE PO LIQD: 237.0000 mL | Freq: Three times a day (TID) | ORAL | Status: AC

## 2024-10-03 MED ORDER — OXYCODONE HCL 5 MG PO TABS: 5.0000 mg | ORAL_TABLET | Freq: Four times a day (QID) | ORAL | 0 refills | Status: DC | PRN

## 2024-10-03 MED ORDER — INSULIN GLARGINE 100 UNIT/ML ~~LOC~~ SOLN
26.0000 [IU] | Freq: Every day | SUBCUTANEOUS | Status: DC
  Administered 2024-10-03: 26 [IU] via SUBCUTANEOUS
  Filled 2024-10-03: qty 0.26

## 2024-10-03 MED ORDER — INSULIN GLARGINE 100 UNIT/ML ~~LOC~~ SOLN: 26.0000 [IU] | Freq: Every day | SUBCUTANEOUS | 11 refills | Status: AC

## 2024-10-03 MED ORDER — ACETAMINOPHEN 325 MG PO TABS: 650.0000 mg | ORAL_TABLET | Freq: Four times a day (QID) | ORAL | Status: AC | PRN

## 2024-10-03 MED ORDER — INSULIN ASPART 100 UNIT/ML IJ SOLN: 6.0000 [IU] | Freq: Three times a day (TID) | INTRAMUSCULAR | 11 refills | Status: AC

## 2024-10-03 NOTE — Plan of Care (Signed)
" °  Problem: Education: Goal: Ability to describe self-care measures that may prevent or decrease complications (Diabetes Survival Skills Education) will improve Outcome: Completed/Met Goal: Individualized Educational Video(s) Outcome: Completed/Met   Problem: Coping: Goal: Ability to adjust to condition or change in health will improve Outcome: Completed/Met   Problem: Fluid Volume: Goal: Ability to maintain a balanced intake and output will improve Outcome: Completed/Met   Problem: Health Behavior/Discharge Planning: Goal: Ability to identify and utilize available resources and services will improve Outcome: Completed/Met Goal: Ability to manage health-related needs will improve Outcome: Completed/Met   Problem: Metabolic: Goal: Ability to maintain appropriate glucose levels will improve Outcome: Completed/Met   Problem: Nutritional: Goal: Maintenance of adequate nutrition will improve Outcome: Completed/Met Goal: Progress toward achieving an optimal weight will improve Outcome: Completed/Met   Problem: Skin Integrity: Goal: Risk for impaired skin integrity will decrease Outcome: Completed/Met   Problem: Tissue Perfusion: Goal: Adequacy of tissue perfusion will improve Outcome: Completed/Met   Problem: Education: Goal: Ability to describe self-care measures that may prevent or decrease complications (Diabetes Survival Skills Education) will improve Outcome: Completed/Met Goal: Individualized Educational Video(s) Outcome: Completed/Met   Problem: Cardiac: Goal: Ability to maintain an adequate cardiac output will improve Outcome: Completed/Met   Problem: Health Behavior/Discharge Planning: Goal: Ability to identify and utilize available resources and services will improve Outcome: Completed/Met Goal: Ability to manage health-related needs will improve Outcome: Completed/Met   Problem: Fluid Volume: Goal: Ability to achieve a balanced intake and output will  improve Outcome: Completed/Met   Problem: Metabolic: Goal: Ability to maintain appropriate glucose levels will improve Outcome: Completed/Met   Problem: Nutritional: Goal: Maintenance of adequate nutrition will improve Outcome: Completed/Met Goal: Maintenance of adequate weight for body size and type will improve Outcome: Completed/Met   Problem: Respiratory: Goal: Will regain and/or maintain adequate ventilation Outcome: Completed/Met   Problem: Urinary Elimination: Goal: Ability to achieve and maintain adequate renal perfusion and functioning will improve Outcome: Completed/Met   Problem: Education: Goal: Knowledge of General Education information will improve Description: Including pain rating scale, medication(s)/side effects and non-pharmacologic comfort measures Outcome: Completed/Met   Problem: Health Behavior/Discharge Planning: Goal: Ability to manage health-related needs will improve Outcome: Completed/Met   Problem: Clinical Measurements: Goal: Ability to maintain clinical measurements within normal limits will improve Outcome: Completed/Met Goal: Will remain free from infection Outcome: Completed/Met Goal: Diagnostic test results will improve Outcome: Completed/Met Goal: Respiratory complications will improve Outcome: Completed/Met Goal: Cardiovascular complication will be avoided Outcome: Completed/Met   Problem: Activity: Goal: Risk for activity intolerance will decrease Outcome: Completed/Met   Problem: Nutrition: Goal: Adequate nutrition will be maintained Outcome: Completed/Met   Problem: Coping: Goal: Level of anxiety will decrease Outcome: Completed/Met   Problem: Elimination: Goal: Will not experience complications related to bowel motility Outcome: Completed/Met Goal: Will not experience complications related to urinary retention Outcome: Completed/Met   Problem: Pain Managment: Goal: General experience of comfort will improve and/or  be controlled Outcome: Completed/Met   Problem: Safety: Goal: Ability to remain free from injury will improve Outcome: Completed/Met   Problem: Skin Integrity: Goal: Risk for impaired skin integrity will decrease Outcome: Completed/Met   Problem: Activity: Goal: Ability to tolerate increased activity will improve Outcome: Completed/Met   Problem: Clinical Measurements: Goal: Ability to maintain a body temperature in the normal range will improve Outcome: Completed/Met   Problem: Respiratory: Goal: Ability to maintain adequate ventilation will improve Outcome: Completed/Met Goal: Ability to maintain a clear airway will improve Outcome: Completed/Met   "

## 2024-10-03 NOTE — Progress Notes (Signed)
 Physical Therapy Treatment Patient Details Name: Kevin Olson MRN: [id number] DOB: 10/24/1945 Today's Date: 10/03/2024   History of Present Illness Kevin Olson is a 79 y.o. male , presented to the ED 09/22/24  with DKA and large left pleural effusion , s/p left thoracentesis, L chest tube placed 1/8.SABRA PMH of diabetes, history of prostate cancer,    PT Comments  PT - Cognition Comments: AxO x 3 (flat effect) improving alertness. Following all commands with increased time.  Slightly foggy recall past few days.  Found incont loose BM in bed. Multiple days of loose stools. Spouse present.   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  Married lives in [place].  Assisted OOB to [facility] due to loose stools.  General bed mobility comments: Required Mod/Max Assist for upper body due to weakness and ABD girth.  Required Max Assist to complete scooting to EOB. General transfer comment: assisted from elevated bed to [facility] 1/4 turn Max VC's on proper hand placement and safety with turns.  Assisted off BSC to walker Mod VC's.  Assisted with peri care. General Gait Details: tolerated an increased distance, still requires + 2 assist for safety such that recliner follows.  Gait is slow with short, shuffled steps.  Avg RA 94%.  Max c/o fatigue/weakness. Returned to room in recliner.  Prior to admit, Pt was IND home with Spouse.  Amb with NO AD.  IND all ADL's.  LPT has rec Pt will need ST Rehab at SNF to address mobility and functional decline prior to safely returning home.    If plan is discharge home, recommend the following: Two people to help with walking and/or transfers;A lot of help with bathing/dressing/bathroom   Can travel by private vehicle     No  Equipment Recommendations  None recommended by PT    Recommendations for Other Services       Precautions / Restrictions Precautions Precautions: Fall Precaution/Restrictions Comments: monitor sats/days of loose  stools Restrictions Weight Bearing Restrictions Per Provider Order: No     Mobility  Bed Mobility Overal bed mobility: Needs Assistance Bed Mobility: Supine to Sit     Supine to sit: Mod assist, +2 for physical assistance, +2 for safety/equipment     General bed mobility comments: Required Mod/Max Assist for upper body due to weakness and ABD girth.  Required Max Assist to complete scooting to EOB.    Transfers Overall transfer level: Needs assistance Equipment used: Rolling walker (2 wheels) Transfers: Sit to/from Stand, Bed to chair/wheelchair/BSC Sit to Stand: Mod assist, From elevated surface Stand pivot transfers: Mod assist, +2 physical assistance, +2 safety/equipment Step pivot transfers: Mod assist       General transfer comment: assisted from elevated bed to [facility] 1/4 turn Max VC's on proper hand placement and safety with turns.  Assisted off BSC to walker Mod VC's.  Assisted with peri care.    Ambulation/Gait Ambulation/Gait assistance: Mod assist, +2 physical assistance, +2 safety/equipment Gait Distance (Feet): 28 Feet Assistive device: Rolling walker (2 wheels) Gait Pattern/deviations: Step-to pattern, Decreased step length - left, Decreased step length - right, Trunk flexed, Knee flexed in stance - left, Knee flexed in stance - right Gait velocity: decreased     General Gait Details: tolerated an increased distance, still requires + 2 assist for safety such that recliner follows.  Gait is slow with short, shuffled steps.  Avg RA 94%.  Max c/o fatigue/weakness.   Stairs  Wheelchair Mobility     Tilt Bed    Modified Rankin (Stroke Patients Only)       Balance                                            Communication    Cognition Arousal: Alert Behavior During Therapy: WFL for tasks assessed/performed, Flat affect   PT - Cognitive impairments: Memory, Problem solving, Safety/Judgement                        PT - Cognition Comments: AxO x 2.5 improving alertness.  Aware he is in the hospital.  Unaware how many days or day of week.  Following all commands with increased time.  Slightly foggy recall past few days.  Found incont loose BM in bed. Multiple days of loose stools. Spouse present.   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  Married lives in [place].        Cueing    Exercises      General Comments        Pertinent Vitals/Pain Pain Assessment Pain Assessment: No/denies pain    Home Living                          Prior Function            PT Goals (current goals can now be found in the care plan section) Progress towards PT goals: Progressing toward goals    Frequency    Min 2X/week      PT Plan      Co-evaluation              AM-PAC PT 6 Clicks Mobility   Outcome Measure  Help needed turning from your back to your side while in a flat bed without using bedrails?: A Lot Help needed moving from lying on your back to sitting on the side of a flat bed without using bedrails?: A Lot Help needed moving to and from a bed to a chair (including a wheelchair)?: A Lot Help needed standing up from a chair using your arms (e.g., wheelchair or bedside chair)?: A Lot Help needed to walk in hospital room?: A Lot Help needed climbing 3-5 steps with a railing? : Total 6 Click Score: 11    End of Session Equipment Utilized During Treatment: Gait belt   Patient left: in chair;with call bell/phone within reach;with chair alarm set;with family/visitor present Nurse Communication: Mobility status PT Visit Diagnosis: Unsteadiness on feet (R26.81)     Time: 9078-9052 PT Time Calculation (min) (ACUTE ONLY): 26 min  Charges:    $Gait Training: 8-22 mins $Therapeutic Activity: 8-22 mins PT General Charges $$ ACUTE PT VISIT: 1 Visit

## 2024-10-03 NOTE — TOC Progression Note (Signed)
 Transition of Care [facility]) - Progression Note    Patient Details  Name: Kevin Olson MRN: [id number] Date of Birth: 1946-03-27  [facility] CM/SW Contact  Toy LITTIE Agar, RN Phone Number:[phone]  10/03/2024, 10:30 AM  Clinical Narrative:    Insurance auth approved 1/15-1/19 Plan auth ID [id number]  auth id [id number]. CM spoke with Kristie at [facility] to confirm that facility can accept patient today.    Expected Discharge Plan:  (TBD awaiting PT/OT recommendations) Barriers to Discharge: Continued Medical Work up               Expected Discharge Plan and Services In-house Referral: NA Discharge Planning Services: CM Consult Post Acute Care Choice: Durable Medical Equipment Living arrangements for the past 2 months: Single Family Home                                       Social Drivers of Health (SDOH) Interventions SDOH Screenings   Food Insecurity: No Food Insecurity (09/22/2024)  Housing: Low Risk (09/22/2024)  Transportation Needs: No Transportation Needs (09/22/2024)  Utilities: Not At Risk (09/22/2024)  Depression (PHQ2-9): Low Risk (06/02/2024)  Social Connections: Patient Declined (09/22/2024)  Tobacco Use: Medium Risk (09/22/2024)    Readmission Risk Interventions    09/25/2024    4:37 PM  Readmission Risk Prevention Plan  Transportation Screening Complete  PCP or Specialist Appt within 5-7 Days Complete  Home Care Screening Complete  Medication Review (RN CM) Complete

## 2024-10-03 NOTE — Plan of Care (Signed)
" °  Problem: Coping: Goal: Ability to adjust to condition or change in health will improve Outcome: Progressing   Problem: Cardiac: Goal: Ability to maintain an adequate cardiac output will improve Outcome: Progressing   Problem: Fluid Volume: Goal: Ability to achieve a balanced intake and output will improve Outcome: Progressing   Problem: Health Behavior/Discharge Planning: Goal: Ability to manage health-related needs will improve Outcome: Progressing   "

## 2024-10-03 NOTE — Discharge Summary (Signed)
 Physician Discharge Summary  Kevin Olson:[id number] DOB: [birth year] DOA: 09/22/2024  PCP: Nanci Senior, MD  Admit date: 09/22/2024 Discharge date: 10/03/2024 Discharging to: SNF Recommendations for Outpatient Follow-up:  Please check CBC and Bmet in 1 wk Please repeat CXR in 1 wk Please wean O2  Consults:  Pulmonary     Discharge Diagnoses:   Principal Problem:   DKA (diabetic ketoacidosis) (HCC) Active Problems:   Acute urinary retention   Acute metabolic encephalopathy   Empyema (HCC)   CAP (community acquired pneumonia)   Malignant neoplasm of prostate (HCC)   IDA (iron deficiency anemia)   Diabetic peripheral neuropathy (HCC)   DKA   Diabetes mellitus type 2, noninsulin dependent (HCC)   Morbid obesity (HCC)     Brief hospital course: Kevin Olson is a 79 y.o. male with PMH of diabetes, history of prostate cancer, who has been sick for about 2 week, presented to the ED and found to have with DKA and large left pleural effusion and admitted on 1/5 and s/p left thoracentesis 50 cc yellow fluid, complex on ultrasound. Recent ED visit 1/1-diagnosed with pneumonia and discharged, also noted to be hyperglycemic with high anion gap at that time.  CT abdomen pelvis with contrast on 1/1 no acute finding. DKA resolved. Having intermittent fever 1/6-CT chest done 1/7- Empyema w/  LUL/LL consolidation-possible pneumonia. PCCM consulted and IR placed pigtail catheter/chest tube 1/8 S/P pleural lytics multiple times PCCM. Chest tube was not removed 1/12-repeat chest x-ray shows no pneumothorax and stable small left pleural effusion low lung volume     Assessment and Plan: Principal Problem:   DKA (diabetic ketoacidosis), diabetes mellitus type 2   Diabetic peripheral neuropathy - A1c 13.2 - DKA likely exacerbated by underlying pneumonia - Continue Lantus  and NovoLog    Active Problems:   Pneumonia, community-acquired, left-sided with loculated pleural effusion suspected  to be empyema - Zosyn  transitioned to Augmentin  to complete 14 days - will dc with 3 L O2 at this time   Acute metabolic encephalopathy - Secondary to above, improving   Hypokalemia - replaced   Acute urinary retention, BPH - Voiding without Foley catheter - Continue Flomax  and mirabegron    Hypertension, cardiomegaly on CT - Continue ARB nd as needed hydralazine    Thrombocytosis  -  follow  IDA - cont oral Iron     Morbid obesity class 1 Body mass index is 34.16 kg/m.  Incontinence of urine and stool - may improve once deconditioning improves  BPH      Discharge Instructions   Allergies as of 10/03/2024       Reactions   Metformin And Related Diarrhea, Other (See Comments)   Explosive diarrhea        Medication List     STOP taking these medications    azithromycin  250 MG tablet Commonly known as: ZITHROMAX    baclofen 10 MG tablet Commonly known as: LIORESAL   OVER THE COUNTER MEDICATION   oxyCODONE  5 MG immediate release tablet Commonly known as: Roxicodone    pioglitazone  15 MG tablet Commonly known as: ACTOS    senna-docusate 8.6-50 MG tablet Commonly known as: Senokot-S       TAKE these medications    acetaminophen  325 MG tablet Commonly known as: TYLENOL  Take 2 tablets (650 mg total) by mouth every 6 (six) hours as needed for mild pain (pain score 1-3) or fever (or Fever >/= 101).   amoxicillin -clavulanate 875-125 MG tablet Commonly known as: AUGMENTIN  Take 1 tablet by mouth 2 (two) times  daily. What changed: when to take this   atorvastatin  10 MG tablet Commonly known as: LIPITOR Take 10 mg by mouth at bedtime.   atorvastatin  40 MG tablet Commonly known as: LIPITOR Take 1 tablet (40 mg total) by mouth daily.   Co Q-10 200 MG Caps Take 200 mg by mouth at bedtime.   DULoxetine  60 MG capsule Commonly known as: CYMBALTA  Take 60 mg by mouth at bedtime.   Emergen-C Vitamin C Pack Take 1 packet by mouth See admin  instructions. Mix and drink the contents of ONE PACKET IN CONJUNCTION WITH 325 mg ferrous sulfate - in the morning and at bedtime   feeding supplement (GLUCERNA SHAKE) Liqd Take 237 mLs by mouth 3 (three) times daily between meals.   ferrous sulfate  325 (65 FE) MG tablet Take 325 mg by mouth See admin instructions. Take 325 mg by mouth in the morning and at bedtime IN CONJUNCTION WITH ONE EMERGEN-C PACKET   Gemtesa 75 MG Tabs Generic drug: Vibegron Take 75 mg by mouth every evening.   insulin  aspart 100 UNIT/ML injection Commonly known as: novoLOG  Inject 6 Units into the skin 3 (three) times daily with meals.   insulin  glargine 100 UNIT/ML injection Commonly known as: LANTUS  Inject 0.26 mLs (26 Units total) into the skin daily. Start taking on: October 04, 2024   Januvia 100 MG tablet Generic drug: sitaGLIPtin Take 100 mg by mouth at bedtime.   levothyroxine  137 MCG tablet Commonly known as: SYNTHROID  Take 137 mcg by mouth daily before breakfast.   pantoprazole  40 MG tablet Commonly known as: PROTONIX  TAKE 1 TABLET BY MOUTH TWICE A DAY What changed: when to take this   primidone  250 MG tablet Commonly known as: MYSOLINE  Take 250 mg by mouth in the morning and at bedtime.   primidone  50 MG tablet Commonly known as: MYSOLINE  Take 50 mg by mouth daily with lunch.   Repatha  SureClick 140 MG/ML Soaj Generic drug: Evolocumab  INJECT 140 MG UNDER THE SKIN EVERY 2 WEEKS (14 DAYS) AS DIRECTED   tamsulosin  0.4 MG Caps capsule Commonly known as: FLOMAX  Take 0.8 mg by mouth at bedtime.   True Metrix Air Glucose Meter Engineer, Manufacturing Glucose Meter kit   True Metrix Blood Glucose Test test strip Generic drug: glucose blood True Metrix Glucose Test Strip   TRUEplus Lancets 33G Misc TRUEplus Lancets 33 gauge   valsartan 40 MG tablet Commonly known as: DIOVAN Take 40 mg by mouth at bedtime.   Vitamin D3 50 MCG (2000 UT) Tabs Take 2,000 Units by mouth every  morning.        Contact information for after-discharge care     Destination     [facility] .   Service: Skilled Nursing Contact information: [address] [place]  [phone]                        The results of significant diagnostics from this hospitalization (including imaging, microbiology, ancillary and laboratory) are listed below for reference.    DG Chest Port 1 View Result Date: 09/30/2024 EXAM: 1 VIEW(S) XRAY OF THE CHEST 09/29/2024 03:12:00 PM COMPARISON: 09/28/2024 CLINICAL HISTORY: Pleural effusion on left FINDINGS: LINES, TUBES AND DEVICES: LEFT-sided chest tube removed. LUNGS AND PLEURA: Stable small LEFT pleural effusion. Stable LEFT retrocardiac consolidation. Low lung volumes. Pulmonary hypoinflation with decreased pulmonary insufflation. No pneumothorax. HEART AND MEDIASTINUM: Cardiomegaly, unchanged. BONES AND SOFT TISSUES: No acute osseous abnormality.  IMPRESSION: 1. Left-sided chest tube removed with no pneumothorax. 2. Stable small left pleural effusion. 3. Stable left retrocardiac consolidation. 4. Low lung volumes with progressive  pulmonary hypoinflation. 5. Cardiomegaly, unchanged. Electronically signed by: Dorethia Molt MD MD 09/30/2024 12:14 AM EST RP Workstation: [id number]   CT CHEST WO CONTRAST Result Date: 09/28/2024 CLINICAL DATA:  Pleural effusion. EXAM: CT CHEST WITHOUT CONTRAST TECHNIQUE: Multidetector CT imaging of the chest was performed following the standard protocol without IV contrast. RADIATION DOSE REDUCTION: This exam was performed according to the departmental dose-optimization program which includes automated exposure control, adjustment of the mA and/or kV according to patient size and/or use of iterative reconstruction technique. COMPARISON:  Chest CT dated 09/24/2024. FINDINGS: Evaluation of this exam is limited in the absence of intravenous contrast. Cardiovascular: There is  no cardiomegaly or pericardial effusion. There is coronary vascular calcification of the LAD. Mild atherosclerotic calcification of the thoracic aorta. No aneurysmal dilatation. The central pulmonary arteries are grossly unremarkable. Mediastinum/Nodes: No obvious hilar adenopathy. Evaluation however is limited in the absence of intravenous contrast as well as due to consolidative changes of the left lung. Top-normal right paratracheal lymph node measures approximately 1 cm in short axis. Subcarinal lymph node measures 13 mm short axis. The esophagus is grossly unremarkable. No mediastinal fluid collection. Lungs/Pleura: Interval placement of a left sided pigtail pleural drain with overall decrease in the size of the left pleural effusion. There is a small residual left pleural effusion. There is consolidative changes of the majority of the left lower lobe which may represent compressive atelectasis or pneumonia. Follow-up to resolution recommended. Improved consolidation of the left upper lobe. Right lung base subpleural subsegmental atelectasis. No pneumothorax. The central airways are patent. Upper Abdomen: No acute abnormality. Musculoskeletal: Osteopenia with degenerative changes. No acute osseous pathology. IMPRESSION: 1. Interval placement of a left-sided pigtail pleural drain with overall decrease in the size of the left pleural effusion. No pneumothorax. 2. Small residual left pleural effusion. Consolidative changes of the majority of the left lower lobe may represent compressive atelectasis or pneumonia. Follow-up to resolution recommended. 3.  Aortic Atherosclerosis (ICD10-I70.0). Electronically Signed   By: Vanetta Chou M.D.   On: 09/28/2024 14:25   DG CHEST PORT 1 VIEW Result Date: 09/28/2024 CLINICAL DATA:  Chest tube EXAM: PORTABLE CHEST 1 VIEW COMPARISON:  09/27/2024 FINDINGS: The cardio pericardial silhouette is enlarged. Left chest tube remains in place without discernible pneumothorax. Left  base collapse/consolidation left effusion similar to prior. Right lung remains clear. Telemetry leads overlie the chest. IMPRESSION: 1. Left chest tube remains in place without discernible pneumothorax. 2. Persistent left base collapse/consolidation and left effusion. Electronically Signed   By: Camellia Candle M.D.   On: 09/28/2024 08:33   DG CHEST PORT 1 VIEW Result Date: 09/27/2024 CLINICAL DATA:  Pleural effusion. EXAM: PORTABLE CHEST 1 VIEW COMPARISON:  09/26/2024 FINDINGS: Low volume film. The cardio pericardial silhouette is enlarged. Left base collapse/consolidation with effusion is similar to prior. Interval apparent decrease in fluid in the left apex. Left chest tube remains in place. No discernible left-sided pneumothorax. Right lung remains clear. IMPRESSION: Low volume film with persistent left base collapse/consolidation and effusion. Electronically Signed   By: Camellia Candle M.D.   On: 09/27/2024 07:02   DG Chest Port 1 View Result Date: 09/26/2024 CLINICAL DATA:  Chest tube EXAM: PORTABLE CHEST 1 VIEW COMPARISON:  09/22/2024 FINDINGS: Interval placement of a left-sided pleural drain with evacuation of lateral loculated pleural fluid. Loculated fluid persists in the  left apex. Retrocardiac collapse/consolidation again noted. Right lung remains relatively clear. The cardio pericardial silhouette is enlarged. Telemetry leads overlie the chest. IMPRESSION: Interval placement of a left-sided pleural drain with evacuation of lateral loculated pleural fluid. Loculated fluid persists in the left apex. Electronically Signed   By: Camellia Candle M.D.   On: 09/26/2024 07:49   CT [facility] PLEURAL DRAIN W/INDWELL CATH W/IMG GUIDE Result Date: 09/25/2024 INDICATION: [id number] Empyema (HCC) [id number] EXAM: CT-GUIDED LEFT NON TUNNELED PLEURAL DRAINAGE CATHETER PLACEMENT COMPARISON:  CT CHEST, 09/24/2024. CHEST XR, 09/22/2024. IR THORACENTESIS, 09/22/2024. MEDICATIONS: The patient is currently admitted to the hospital and  receiving intravenous antibiotics. The antibiotics were administered within an appropriate time frame prior to the initiation of the procedure. ANESTHESIA/SEDATION: Moderate (conscious) sedation was employed during this procedure. A total of Versed  2 mg and Fentanyl  100 mcg was administered intravenously. Moderate Sedation Time: 23 minutes. The patient's level of consciousness and vital signs were monitored continuously by radiology nursing throughout the procedure under my direct supervision. CONTRAST:  None FLUOROSCOPY TIME:  CT dose; 757 mGycm COMPLICATIONS: None immediate. PROCEDURE: RADIATION DOSE REDUCTION: This exam was performed according to the departmental dose-optimization program which includes automated exposure control, adjustment of the mA and/or kV according to patient size and/or use of iterative reconstruction technique. Informed written consent was obtained from the patient after a discussion of the risks, benefits and alternatives to treatment. The patient was placed supine on the CT gantry and a pre procedural CT was performed re-demonstrating the known abscess/fluid collection within the LEFT chest. The procedure was planned. A timeout was performed prior to the initiation of the procedure. The LEFT chest was prepped and draped in the usual sterile fashion. The overlying soft tissues were anesthetized with 1% lidocaine  with epinephrine . Appropriate trajectory was planned with the use of a 22 gauge spinal needle. An 18 gauge trocar needle was advanced into the abscess/fluid collection and a short Amplatz super stiff wire was coiled within the collection. Appropriate positioning was confirmed with a limited CT scan. The tract was serially dilated allowing placement of a 14 Fr drainage catheter. Appropriate positioning was confirmed with a limited postprocedural CT scan. 5 mL of serous pleural fluid was aspirated. The tube was connected to a intermittent vacuum suction and sutured in place. A  dressing was placed. The patient tolerated the procedure well without immediate post procedural complication. IMPRESSION: Successful CT-guided placement of a 14 Fr LEFT chest non tunneled pleural drainage catheter. Diagnostic aspiration of 5 mL of serous pleural fluid. Samples were sent to the laboratory as requested by the ordering clinical team. RECOMMENDATIONS: Fibrinolysis and additional management of this pleural catheter is deferred to the Pulmonary Medicine service. Thom Hall, MD [facility] Electronically Signed   By: Thom Hall M.D.   On: 09/25/2024 17:36   CT CHEST WO CONTRAST Result Date: 09/24/2024 CLINICAL DATA:  Fever DKA EXAM: CT CHEST WITHOUT CONTRAST TECHNIQUE: Multidetector CT imaging of the chest was performed following the standard protocol without IV contrast. RADIATION DOSE REDUCTION: This exam was performed according to the departmental dose-optimization program which includes automated exposure control, adjustment of the mA and/or kV according to patient size and/or use of iterative reconstruction technique. COMPARISON:  Chest x-ray 09/22/2024 FINDINGS: Cardiovascular: Limited assessment without intravenous contrast. Moderate aortic atherosclerosis. No aneurysm. Coronary vascular calcification. Cardiomegaly. No significant pericardial effusion Mediastinum/Nodes: Patent trachea. No suspicious thyroid  mass. No suspicious lymph nodes. Esophagus within normal limits. Lungs/Pleura: Moderate loculated appearing left  pleural effusion. Multiple loculated gas collections within the pleural fluid. Consolidative airspace disease in the left upper and lower lobes. Upper Abdomen: No acute finding Musculoskeletal: No acute osseous abnormality IMPRESSION: 1. Moderate loculated appearing left pleural effusion with multiple loculated gas collections within the pleural fluid, appearance is suspect for empyema, further assessment limited in the  absence of contrast. 2. Consolidative airspace disease in the left upper and lower lobes, possible pneumonia. 3. Cardiomegaly. 4. Aortic atherosclerosis. Aortic Atherosclerosis (ICD10-I70.0). Electronically Signed   By: Luke Bun M.D.   On: 09/24/2024 16:20   IR THORACENTESIS ASP PLEURAL SPACE W/IMG GUIDE Result Date: 09/23/2024 INDICATION: Patient with recent diagnosis of pneumonia, currently admitted with DKA and large left pleural effusion. Request for diagnostic and therapeutic left thoracentesis. EXAM: ULTRASOUND GUIDED DIAGNOSTIC AND THERAPEUTIC LEFT THORACENTESIS MEDICATIONS: 10 mL 1% lidocaine  COMPLICATIONS: None immediate. PROCEDURE: An ultrasound guided thoracentesis was thoroughly discussed with the patient and questions answered. The benefits, risks, alternatives and complications were also discussed. The patient understands and wishes to proceed with the procedure. Written consent was obtained. Ultrasound was performed to localize and mark an adequate pocket of fluid in the left chest. The area was then prepped and draped in the normal sterile fashion. 1% Lidocaine  was used for local anesthesia. Under ultrasound guidance a 6 Fr Safe-T-Centesis catheter was introduced. Thoracentesis was performed. Small amount of fluid along with air was aspirated. No further fluid able to be aspirated. The catheter was removed and a dressing applied. FINDINGS: A total of approximately 50 mL of yellow fluid was removed. Samples were sent to the laboratory as requested by the clinical team. IMPRESSION: Successful ultrasound guided left thoracentesis yielding 50 mL of pleural fluid. Performed and dictated by Kimble Clas, PA-C Electronically Signed   By: Marcey Moan M.D.   On: 09/23/2024 10:09   DG Chest Port 1 View Result Date: 09/22/2024 EXAM: 1 VIEW(S) XRAY OF THE CHEST 09/22/2024 04:17:00 PM COMPARISON: 09/22/2024 CLINICAL HISTORY: S/P thoracentesis [id number] FINDINGS: LUNGS AND PLEURA: Improved left lung  aeration post thoracentesis with small residual left pleural effusion. Left mid to lower lung airspace disease. The right lung is clear. No pneumothorax. HEART AND MEDIASTINUM: Aortic atherosclerosis. Cardiac enlargement. BONES AND SOFT TISSUES: Degenerative changes in the spine and shoulders. IMPRESSION: 1. Improved left lung aeration post thoracentesis with small residual left pleural effusion and left mid to lower lung airspace disease. 2. Cardiac enlargement. Electronically signed by: Elsie Gravely MD 09/22/2024 05:11 PM EST RP Workstation: [id number]   DG Chest 2 View Result Date: 09/22/2024 CLINICAL DATA:  Shortness of breath.  Weakness. EXAM: CHEST - 2 VIEW COMPARISON:  09/18/2024 FINDINGS: Increased size of moderate to large left pleural effusion is seen since previous study. Right lung remains clear. Heart size appears stable. IMPRESSION: Increased size of moderate to large left pleural effusion. Electronically Signed   By: Norleen DELENA Kil M.D.   On: 09/22/2024 12:34   CT ABDOMEN PELVIS W CONTRAST Result Date: 09/18/2024 EXAM: CT ABDOMEN AND PELVIS WITH CONTRAST 09/18/2024 09:42:21 AM TECHNIQUE: CT of the abdomen and pelvis was performed with the administration of 100 mL of iohexol  (OMNIPAQUE ) 300 MG/ML solution. Multiplanar reformatted images are provided for review. Automated exposure control, iterative reconstruction, and/or weight-based adjustment of the mA/kV was utilized to reduce the radiation dose to as low as reasonably achievable. COMPARISON: 07/03/2025 CLINICAL HISTORY: LLQ abdominal pain FINDINGS: LOWER CHEST: Mild left lower lobe airspace opacity left lower lobe airspace disease is present. LIVER: Fatty infiltration of liver is  present. No discrete lesions are present. GALLBLADDER AND BILE DUCTS: Gallbladder is unremarkable. No biliary ductal dilatation. SPLEEN: No acute abnormality. PANCREAS: Chronic fatty infiltration of the pancreas is stable from prior exam. ADRENAL GLANDS: No acute  abnormality. KIDNEYS, URETERS AND BLADDER: A simple cyst at the upper pole of the right kidney has increased in size, now measuring 4.9 cm. A simple cyst in the medial aspect of the right kidney measures 2.1 cm. Per consensus, no follow-up is needed for simple Bosniak type 1 and 2 renal cysts, unless the patient has a malignancy history or risk factors. No stones in the kidneys or ureters. No hydronephrosis. No perinephric or periureteral stranding. Urinary bladder is unremarkable. GI AND BOWEL: Stomach demonstrates no acute abnormality. Diverticular changes are present in the distal descending and sigmoid colon without focal inflammation to suggest diverticulitis. There is no bowel obstruction. PERITONEUM AND RETROPERITONEUM: No ascites. No free air. VASCULATURE: Atherosclerotic calcifications are present in the aorta and branch vessels without aneurysm. LYMPH NODES: No lymphadenopathy. REPRODUCTIVE ORGANS: Calculi are present within the prostate. BONES AND SOFT TISSUES: Grade 1 anterolisthesis and uncovertebral broad-based disc protrusion is present at L4-5 with mild central and bilateral foraminal narrowing. A vacuum disc is present at L5-S1. No acute osseous abnormality. No focal soft tissue abnormality. IMPRESSION: 1. No acute findings in the abdomen or pelvis, specifically no evidence of diverticulitis. 2. Mild left lower lobe airspace opacity may represent early infection. . Electronically signed by: Lonni Necessary MD 09/18/2024 10:46 AM EST RP Workstation: [id number]   DG Chest 2 View Result Date: 09/18/2024 EXAM: 2 VIEW(S) XRAY OF THE CHEST 09/18/2024 07:54:00 AM COMPARISON: 11/22/2021. CLINICAL HISTORY: shortness of breath FINDINGS: LUNGS AND PLEURA: Left basilar opacity, may represent atelectasis or pneumonia. No pleural effusion. No pneumothorax. HEART AND MEDIASTINUM: Aortic calcification. BONES AND SOFT TISSUES: No acute osseous abnormality. IMPRESSION: 1. Left basilar opacity, which may  represent atelectasis or pneumonia. Electronically signed by: Waddell Calk MD 09/18/2024 08:12 AM EST RP Workstation: [id number]   Labs:   Basic Metabolic Panel: Recent Labs  Lab 09/28/24 0250 09/29/24 0647 09/30/24 0750 10/01/24 1316 10/02/24 0754  NA 134* 136 137 137 139  K 3.5 3.4* 3.4* 3.3* 3.8  CL 99 100 100 100 106  CO2 23 23 24 27 26   GLUCOSE 199* 154* 147* 316* 165*  BUN 17 18 14 13 11   CREATININE 1.23 1.14 0.88 0.94 0.84  CALCIUM  8.9 8.8* 9.0 9.1 9.0  MG  --   --   --   --  2.2     CBC: Recent Labs  Lab 09/28/24 0250 09/29/24 0647 09/30/24 0750 10/01/24 1316 10/02/24 0754  WBC 8.4 11.0* 12.6* 10.1 8.2  HGB 11.0* 10.4* 10.5* 11.5* 10.4*  HCT 32.1* 30.8* 31.3* 33.6* 30.6*  MCV 95.0 96.0 96.0 96.6 95.6  PLT 417* 448* 557* 649* 629*         SIGNED:   True Atlas, MD  [facility] 10/03/2024, 11:31 AM Time taking on discharge: 50 minutes

## 2024-10-03 NOTE — TOC Transition Note (Signed)
 Transition of Care [facility]) - Discharge Note   Patient Details  Name: Kevin Olson MRN: [id number] Date of Birth: 1946/01/15  Transition of Care [facility] CM/SW Contact:  Toy LITTIE Agar, RN Phone Number:[phone]  10/03/2024, 1:19 PM   Clinical Narrative:    CM has confirmed that patient can discharge to [facility]. Transportation has been arranged per [facility]. Wife [facility]) has been updated. D/C packet is at nurses station in chart. No other needs noted. CM will sign off.    Final next level of care: Skilled Nursing Facility Barriers to Discharge: No Barriers Identified   Patient Goals and CMS Choice Patient states their goals for this hospitalization and ongoing recovery are:: Home CMS Medicare.gov Compare Post Acute Care list provided to:: Patient Choice offered to / list presented to : Patient [place] ownership interest in [facility].provided to:: Patient    Discharge Placement   Existing PASRR number confirmed : 10/03/24 ([id number])          Patient chooses bed at: Other - please specify in the comment section below: ([facility]) Patient to be transferred to facility by: [facility] Name of family member notified: Jacori Mulrooney [phone] Patient and family notified of of transfer: 10/03/24  Discharge Plan and Services Additional resources added to the After Visit Summary for   In-house Referral: NA Discharge Planning Services: CM Consult Post Acute Care Choice: Durable Medical Equipment          DME Arranged: N/A DME Agency: NA       HH Arranged: NA HH Agency: NA        Social Drivers of Health (SDOH) Interventions SDOH Screenings   Food Insecurity: No Food Insecurity (09/22/2024)  Housing: Low Risk (09/22/2024)  Transportation Needs: No Transportation Needs (09/22/2024)  Utilities: Not At Risk (09/22/2024)  Depression (PHQ2-9): Low Risk (06/02/2024)  Social Connections: Patient Declined (09/22/2024)  Tobacco Use: Medium Risk (09/22/2024)      Readmission Risk Interventions    09/25/2024    4:37 PM  Readmission Risk Prevention Plan  Transportation Screening Complete  PCP or Specialist Appt within 5-7 Days Complete  Home Care Screening Complete  Medication Review (RN CM) Complete

## 2024-10-03 NOTE — Care Management Important Message (Signed)
 Important Message  Patient Details IM Letter given. Name: Kevin Olson MRN: [id number] Date of Birth: Jan 16, 1946   Important Message Given:  Yes - Medicare IM     Melba Ates 10/03/2024, 2:27 PM

## 2024-10-03 NOTE — Progress Notes (Signed)
 This nurse called facility and report given to Jonita, LPN.

## 2024-10-08 ENCOUNTER — Encounter: Payer: Self-pay | Admitting: Hematology and Oncology

## 2024-12-02 ENCOUNTER — Inpatient Hospital Stay: Admitting: Hematology and Oncology

## 2024-12-02 ENCOUNTER — Inpatient Hospital Stay
